# Patient Record
Sex: Female | Born: 1935 | Race: White | Hispanic: No | Smoking: Never smoker
Health system: Southern US, Community
[De-identification: ages and names within clinical notes are randomized; demographics above are authoritative.]

## PROBLEM LIST (undated history)

## (undated) DIAGNOSIS — M1990 Unspecified osteoarthritis, unspecified site: Secondary | ICD-10-CM

## (undated) DIAGNOSIS — Z947 Corneal transplant status: Secondary | ICD-10-CM

## (undated) DIAGNOSIS — E785 Hyperlipidemia, unspecified: Secondary | ICD-10-CM

## (undated) DIAGNOSIS — I10 Essential (primary) hypertension: Secondary | ICD-10-CM

## (undated) DIAGNOSIS — C569 Malignant neoplasm of unspecified ovary: Secondary | ICD-10-CM

## (undated) DIAGNOSIS — F32A Depression, unspecified: Secondary | ICD-10-CM

## (undated) DIAGNOSIS — Z8619 Personal history of other infectious and parasitic diseases: Secondary | ICD-10-CM

## (undated) DIAGNOSIS — F419 Anxiety disorder, unspecified: Secondary | ICD-10-CM

## (undated) DIAGNOSIS — K5790 Diverticulosis of intestine, part unspecified, without perforation or abscess without bleeding: Secondary | ICD-10-CM

## (undated) DIAGNOSIS — T7840XA Allergy, unspecified, initial encounter: Secondary | ICD-10-CM

## (undated) DIAGNOSIS — I639 Cerebral infarction, unspecified: Secondary | ICD-10-CM

## (undated) DIAGNOSIS — H269 Unspecified cataract: Secondary | ICD-10-CM

## (undated) DIAGNOSIS — H409 Unspecified glaucoma: Secondary | ICD-10-CM

## (undated) DIAGNOSIS — Z5189 Encounter for other specified aftercare: Secondary | ICD-10-CM

## (undated) DIAGNOSIS — F329 Major depressive disorder, single episode, unspecified: Secondary | ICD-10-CM

## (undated) HISTORY — DX: Hyperlipidemia, unspecified: E78.5

## (undated) HISTORY — PX: CORNEAL TRANSPLANT: SHX108

## (undated) HISTORY — PX: OOPHORECTOMY: SHX86

## (undated) HISTORY — DX: Corneal transplant status: Z94.7

## (undated) HISTORY — DX: Unspecified cataract: H26.9

## (undated) HISTORY — DX: Essential (primary) hypertension: I10

## (undated) HISTORY — DX: Anxiety disorder, unspecified: F41.9

## (undated) HISTORY — DX: Personal history of other infectious and parasitic diseases: Z86.19

## (undated) HISTORY — PX: SPINE SURGERY: SHX786

## (undated) HISTORY — DX: Unspecified osteoarthritis, unspecified site: M19.90

## (undated) HISTORY — PX: COLON SURGERY: SHX602

## (undated) HISTORY — PX: EYE SURGERY: SHX253

## (undated) HISTORY — PX: ABDOMINAL HYSTERECTOMY: SHX81

## (undated) HISTORY — DX: Encounter for other specified aftercare: Z51.89

## (undated) HISTORY — DX: Unspecified glaucoma: H40.9

## (undated) HISTORY — DX: Diverticulosis of intestine, part unspecified, without perforation or abscess without bleeding: K57.90

## (undated) HISTORY — DX: Major depressive disorder, single episode, unspecified: F32.9

## (undated) HISTORY — DX: Cerebral infarction, unspecified: I63.9

## (undated) HISTORY — DX: Allergy, unspecified, initial encounter: T78.40XA

## (undated) HISTORY — DX: Depression, unspecified: F32.A

---

## 1984-12-05 DIAGNOSIS — Z8543 Personal history of malignant neoplasm of ovary: Secondary | ICD-10-CM | POA: Insufficient documentation

## 1984-12-05 HISTORY — PX: APPENDECTOMY: SHX54

## 1984-12-05 HISTORY — PX: BILATERAL SALPINGOOPHORECTOMY: SHX1223

## 1993-12-05 DIAGNOSIS — C569 Malignant neoplasm of unspecified ovary: Secondary | ICD-10-CM

## 1993-12-05 HISTORY — DX: Malignant neoplasm of unspecified ovary: C56.9

## 1993-12-05 HISTORY — PX: COLON RESECTION: SHX5231

## 1996-12-05 HISTORY — PX: CHOLECYSTECTOMY: SHX55

## 1998-12-05 DIAGNOSIS — Z8673 Personal history of transient ischemic attack (TIA), and cerebral infarction without residual deficits: Secondary | ICD-10-CM | POA: Insufficient documentation

## 1998-12-05 DIAGNOSIS — F32A Depression, unspecified: Secondary | ICD-10-CM | POA: Insufficient documentation

## 2005-01-07 ENCOUNTER — Ambulatory Visit: Payer: Self-pay | Admitting: General Surgery

## 2005-01-28 ENCOUNTER — Ambulatory Visit: Payer: Self-pay | Admitting: General Surgery

## 2008-12-05 HISTORY — PX: CATARACT EXTRACTION: SUR2

## 2009-01-07 ENCOUNTER — Ambulatory Visit: Payer: Self-pay | Admitting: Family Medicine

## 2009-03-17 DIAGNOSIS — E782 Mixed hyperlipidemia: Secondary | ICD-10-CM | POA: Insufficient documentation

## 2009-03-29 DIAGNOSIS — E559 Vitamin D deficiency, unspecified: Secondary | ICD-10-CM | POA: Insufficient documentation

## 2009-05-30 IMAGING — CR DG HIP COMPLETE 2+V*L*
1 series · 2 of 2 positions shown · non-contrast
Comparison: none

REASON FOR EXAM: back pain, left hip pain
COMMENTS:

PROCEDURE:     KDR - KDXR HIP LEFT COMPLETE  - January 07, 2009  [DATE]
RESULT:     No fracture, dislocation or other acute bony abnormality is
identified. The hip joint space is well maintained. No lytic or blastic
lesions are seen.

[Series 2: view not recorded · 0.17mm/px · 2 of 2 slices shown]
[im 1/2]
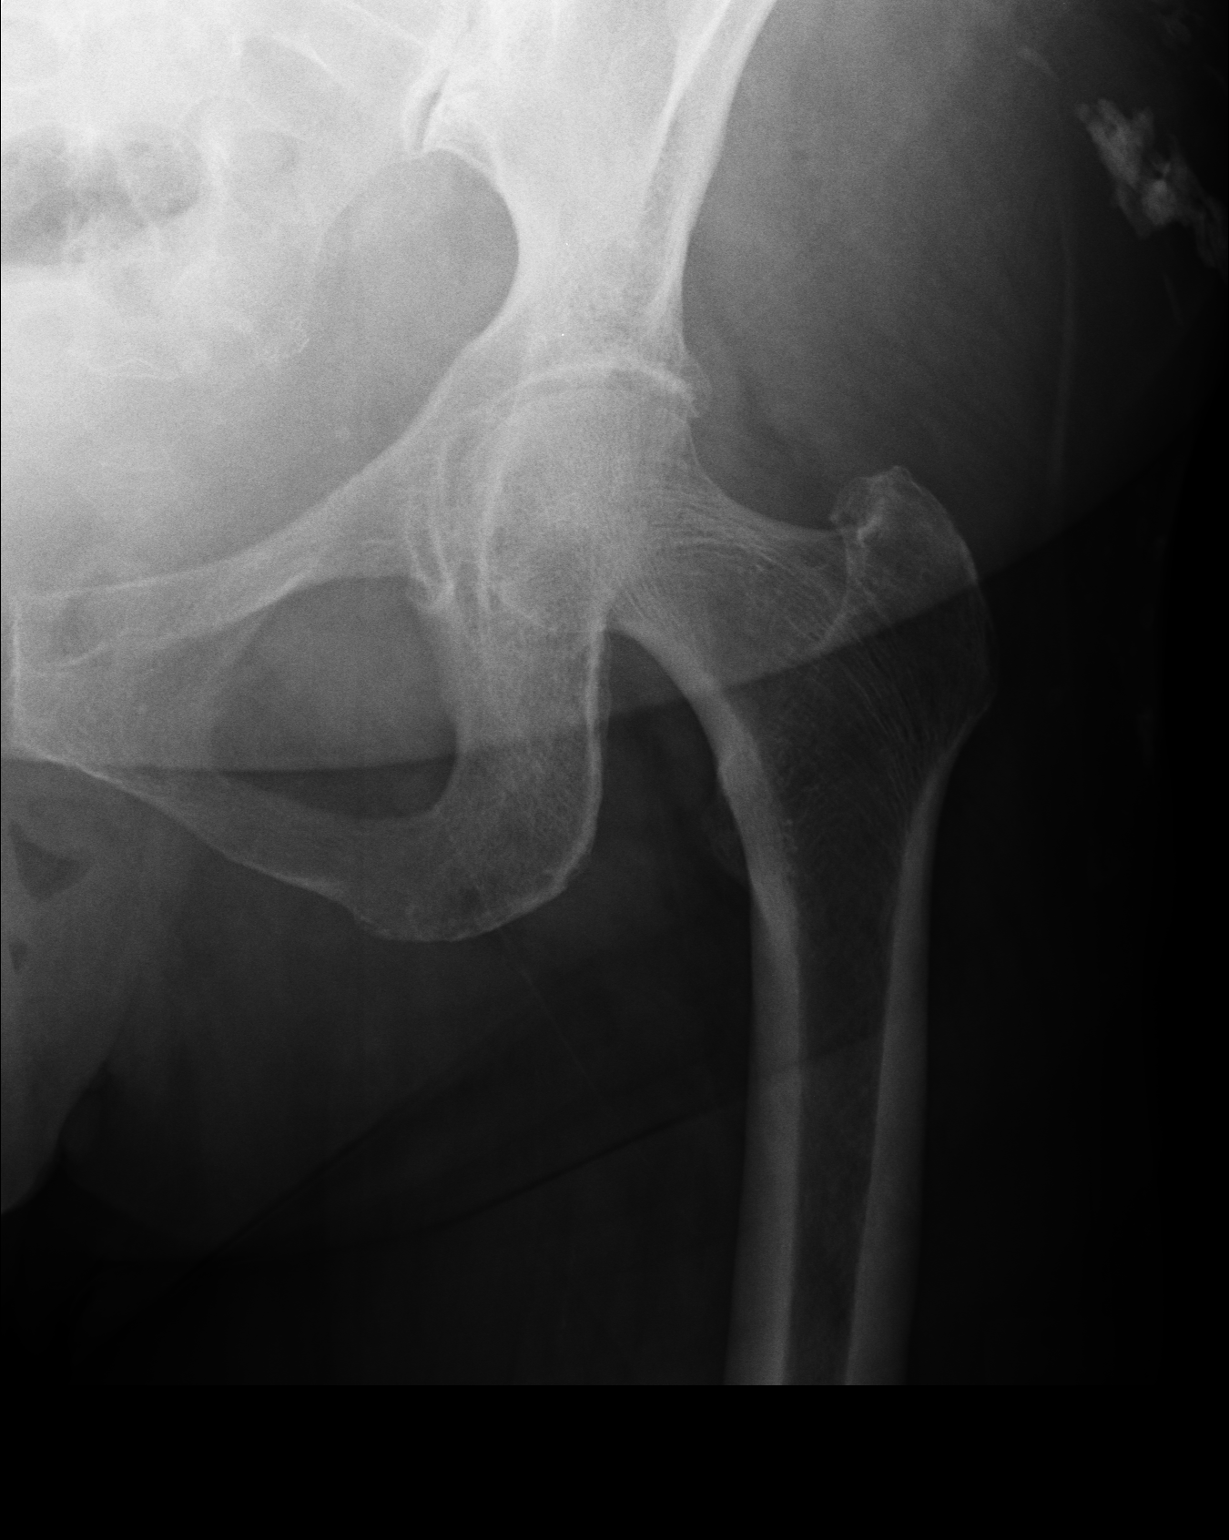
[im 2/2]
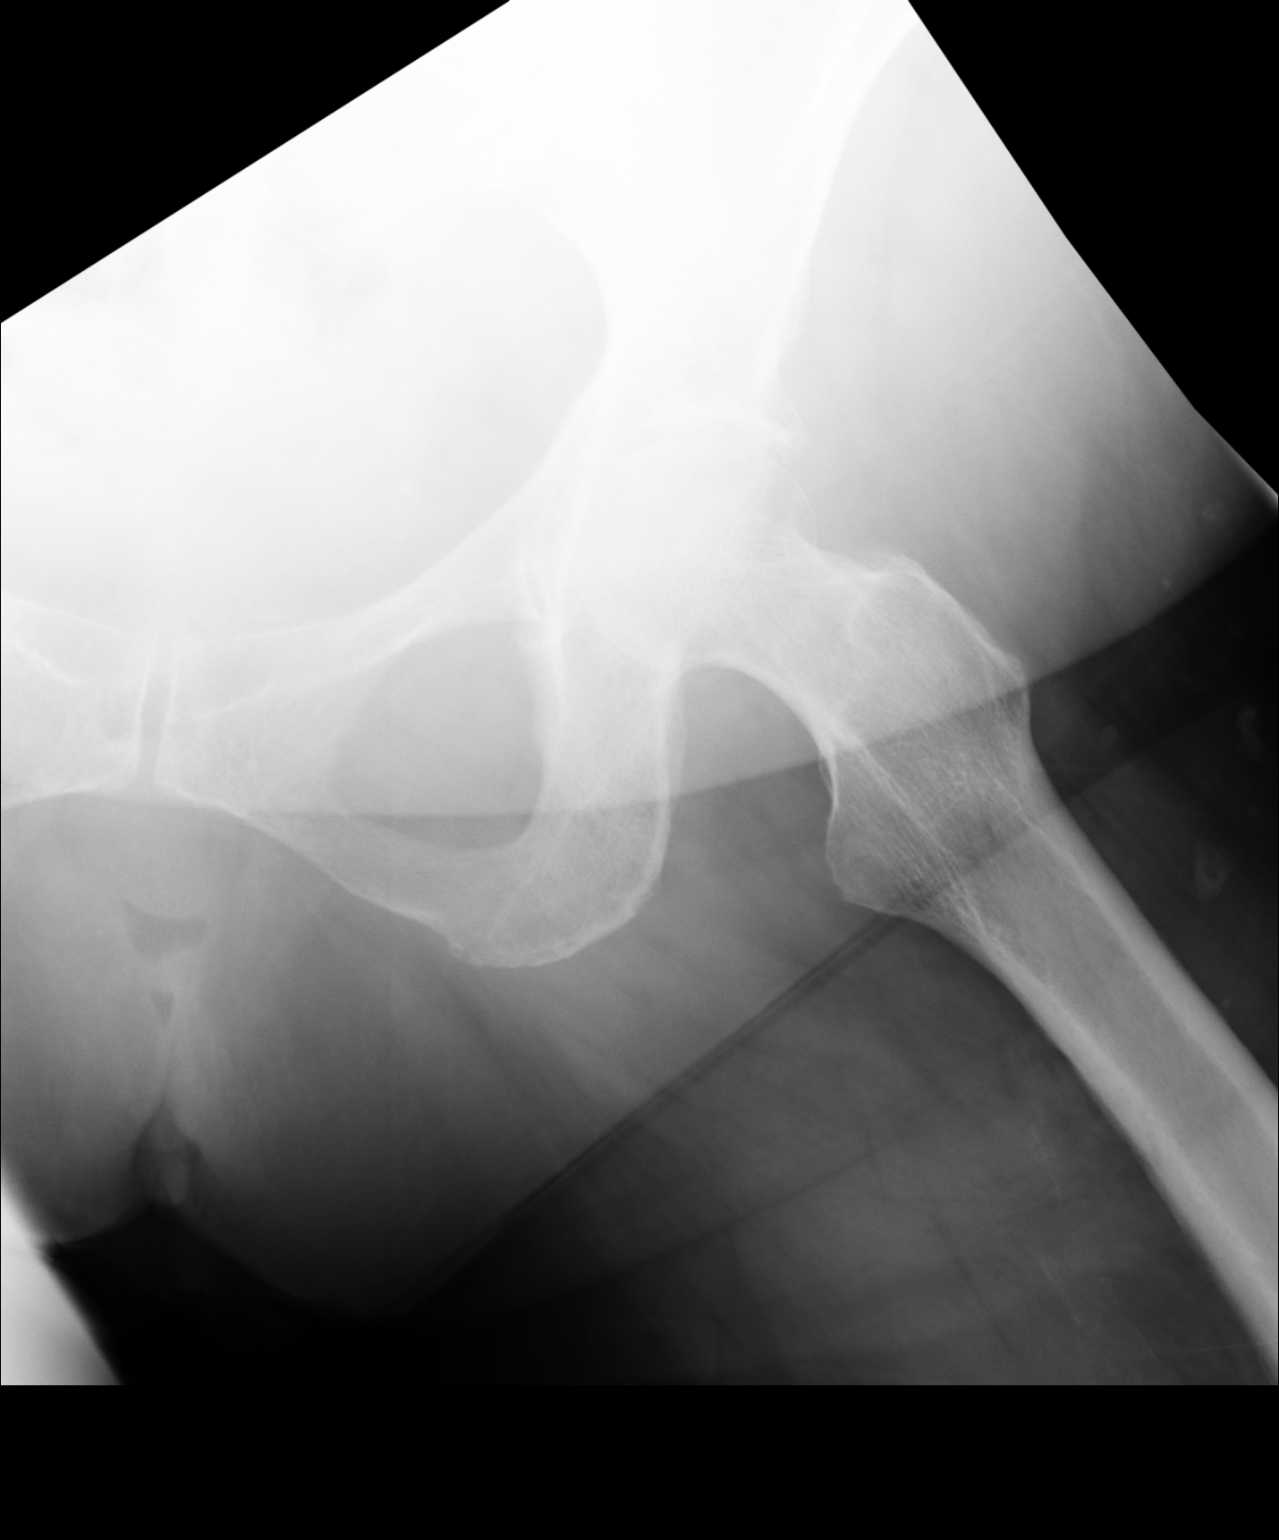

[2 of 2 positions shown; findings below may reference images not displayed]

IMPRESSION: No acute changes are identified.

## 2009-05-30 IMAGING — CR DG LUMBAR SPINE 2-3V
1 series · 3 of 3 positions shown · non-contrast
Comparison: none

REASON FOR EXAM: back pain, left hip pain
COMMENTS:

[Series 2: view not recorded · 0.17mm/px · 3 of 3 slices shown]
[im 1/3]
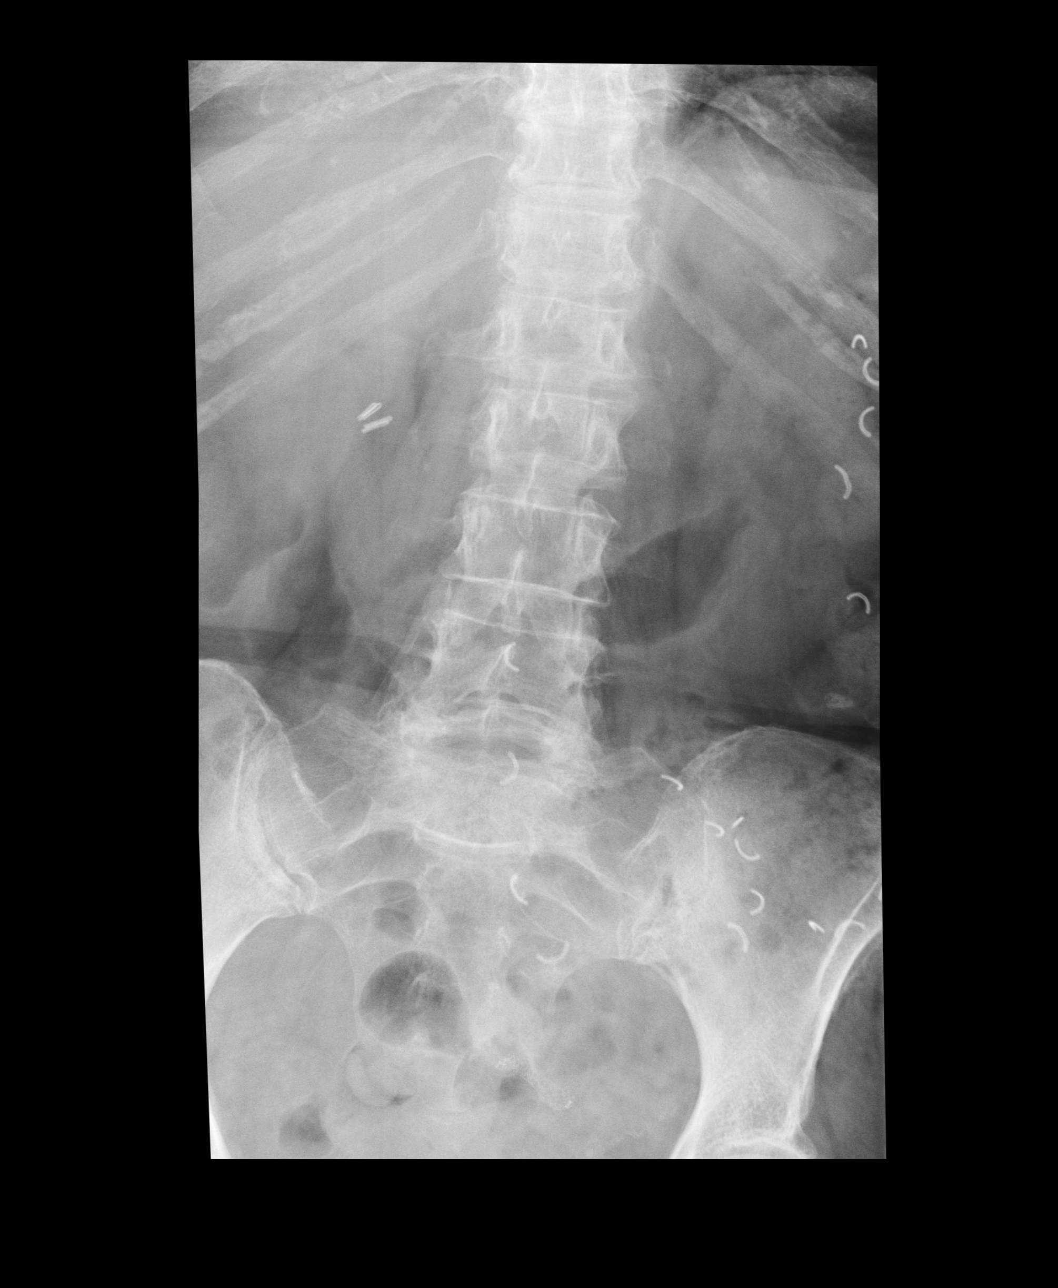
[im 2/3]
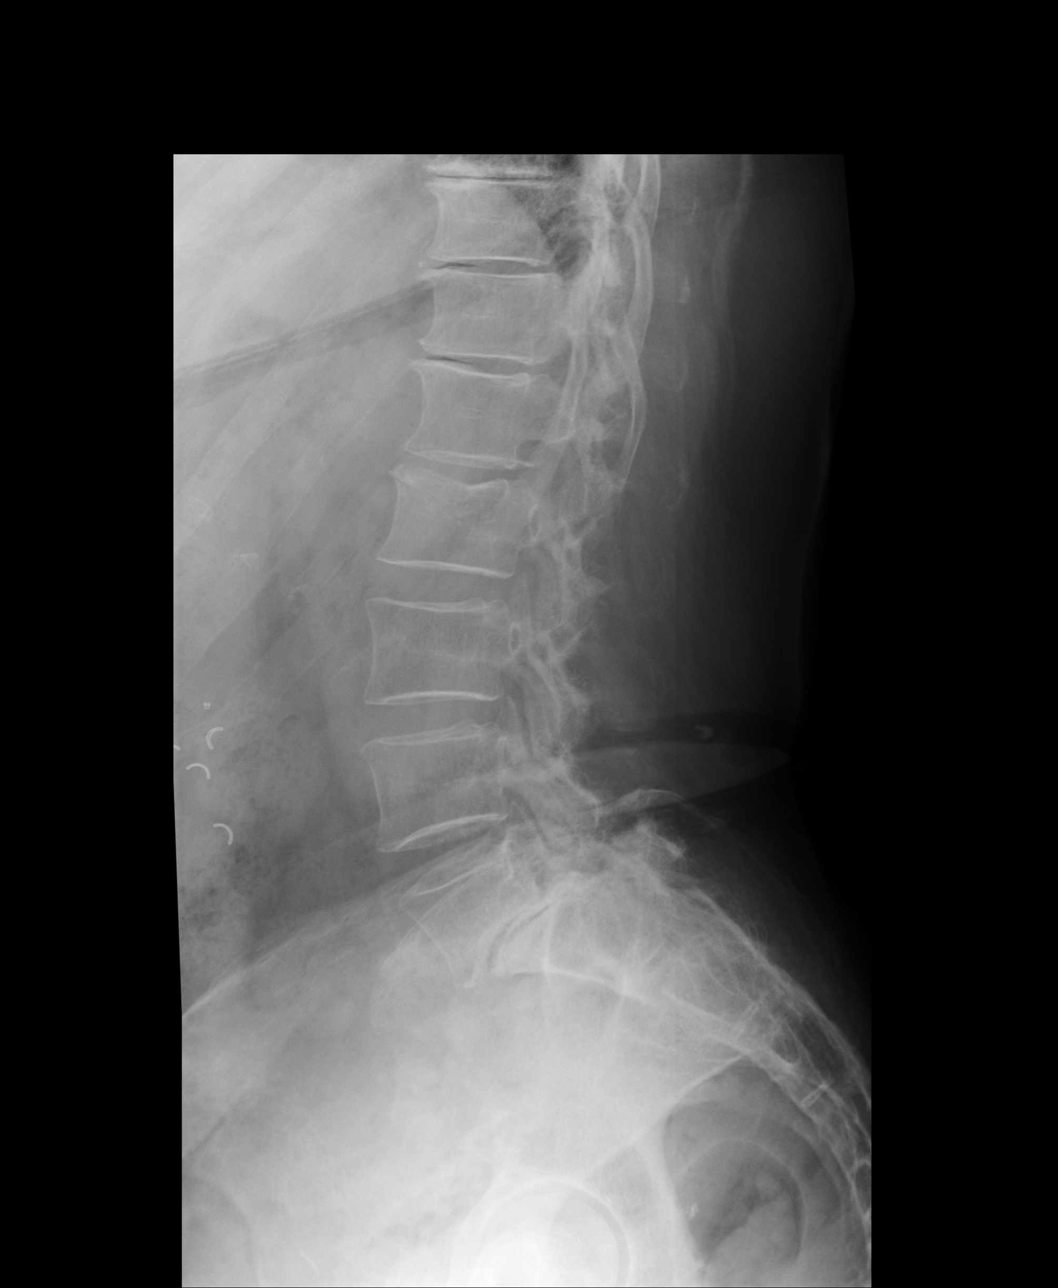
[im 3/3]
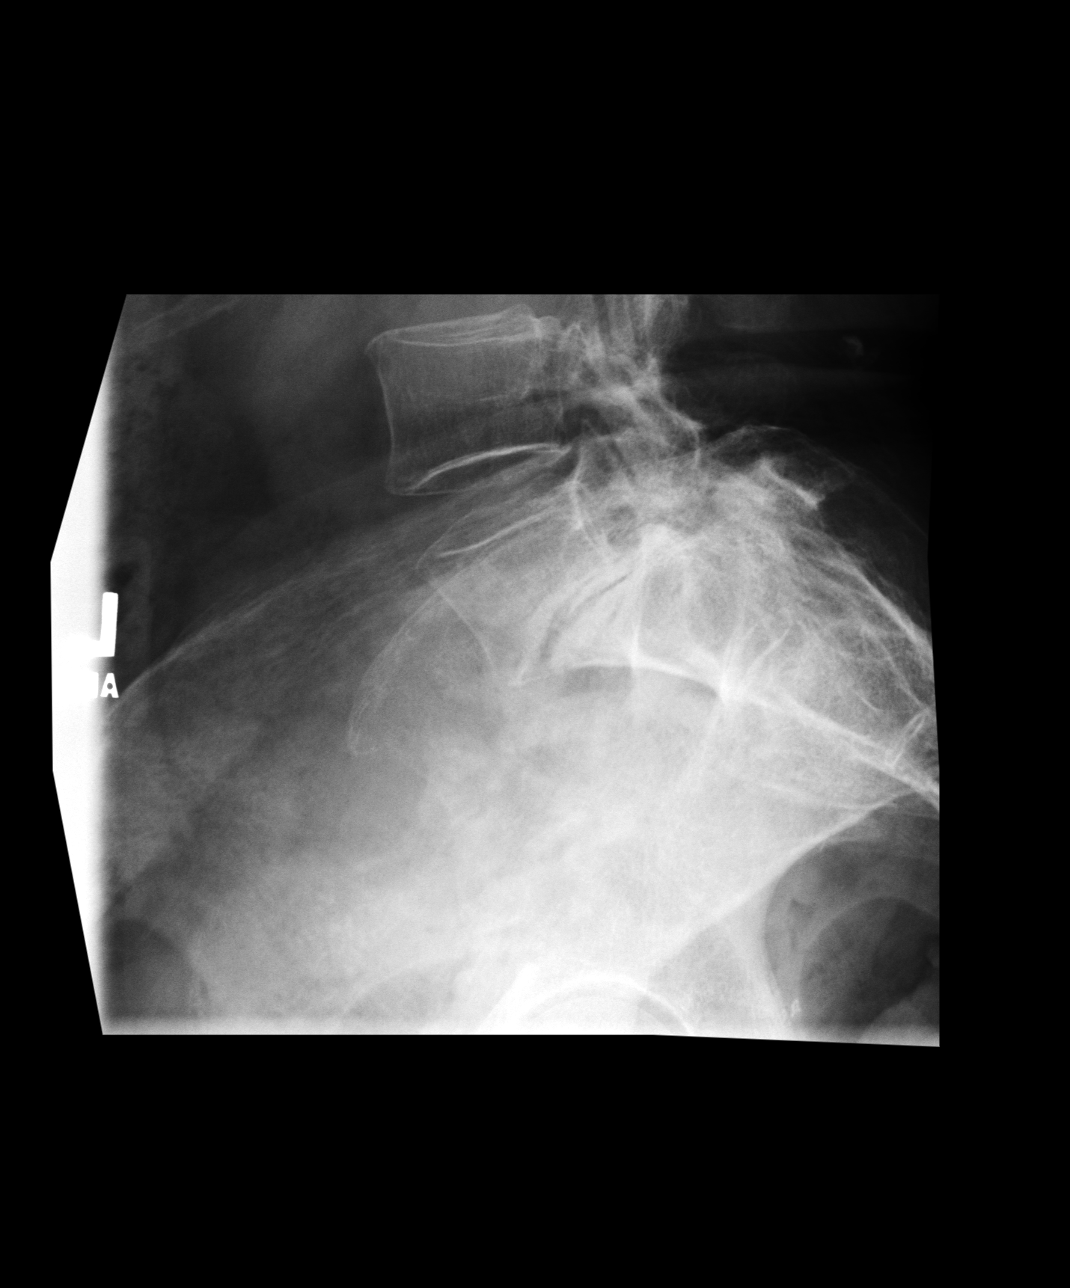

[3 of 3 positions shown; findings below may reference images not displayed]

PROCEDURE:     KDR - KDXR LUMBAR SPINE AP AND LATERAL  - January 07, 2009  [DATE]

RESULT:     No vertebral body fracture is seen. There is a 1.0 cm
anterolisthesis of L5 with respect to S1. There is narrowing of the L5-S1
disc space compatible with disc disease. The vertebral body alignment
otherwise is normal. There is slight narrowing of the L1-L2 intervertebral
disc space. The intervertebral disc spaces otherwise are well maintained.
The pedicles are bilaterally intact. No lytic or blastic lesions are seen.
IMPRESSION: 1. There is a 1.0 cm spondylolisthesis at L5-S1. This could be further
evaluated with CT if clinically indicated.
2. There is narrowing of L5-S1 disc space consistent with disc disease.
3. There is slight narrowing of the L1-L2 intervertebral disc space.
4. No lytic or blastic lesions are seen
5. No acute fracture is observed.

## 2009-08-03 ENCOUNTER — Inpatient Hospital Stay: Payer: Self-pay | Admitting: *Deleted

## 2009-08-13 DIAGNOSIS — G4700 Insomnia, unspecified: Secondary | ICD-10-CM | POA: Insufficient documentation

## 2009-09-03 DIAGNOSIS — R609 Edema, unspecified: Secondary | ICD-10-CM | POA: Insufficient documentation

## 2010-12-31 ENCOUNTER — Ambulatory Visit: Payer: Self-pay | Admitting: Family Medicine

## 2011-11-22 ENCOUNTER — Ambulatory Visit: Payer: Self-pay | Admitting: Family Medicine

## 2012-02-01 ENCOUNTER — Ambulatory Visit: Payer: Self-pay | Admitting: Family Medicine

## 2012-03-12 ENCOUNTER — Ambulatory Visit: Payer: Self-pay | Admitting: Physician Assistant

## 2012-05-05 HISTORY — PX: BACK SURGERY: SHX140

## 2012-10-24 LAB — HM DEXA SCAN: HM Dexa Scan: NORMAL

## 2013-02-15 ENCOUNTER — Ambulatory Visit: Payer: Self-pay | Admitting: Family Medicine

## 2013-08-07 ENCOUNTER — Ambulatory Visit: Payer: Self-pay | Admitting: Family Medicine

## 2013-09-18 DIAGNOSIS — H401190 Primary open-angle glaucoma, unspecified eye, stage unspecified: Secondary | ICD-10-CM | POA: Insufficient documentation

## 2014-03-21 ENCOUNTER — Ambulatory Visit: Payer: Self-pay | Admitting: Family Medicine

## 2014-03-21 LAB — HM MAMMOGRAPHY

## 2014-11-06 LAB — LIPID PANEL
Cholesterol: 163 mg/dL (ref 0–200)
HDL: 27 mg/dL — AB (ref 35–70)
LDL CALC: 93 mg/dL
Triglycerides: 216 mg/dL — AB (ref 40–160)

## 2014-12-31 LAB — CBC AND DIFFERENTIAL
HCT: 36 % (ref 36–46)
HEMOGLOBIN: 12.1 g/dL (ref 12.0–16.0)
Platelets: 262 10*3/uL (ref 150–399)
WBC: 6.8 10^3/mL

## 2014-12-31 LAB — BASIC METABOLIC PANEL
BUN: 24 mg/dL — AB (ref 4–21)
Creatinine: 1.1 mg/dL (ref 0.5–1.1)
GLUCOSE: 122 mg/dL
Potassium: 4.5 mmol/L (ref 3.4–5.3)
Sodium: 142 mmol/L (ref 137–147)

## 2014-12-31 LAB — HEPATIC FUNCTION PANEL
ALT: 16 U/L (ref 7–35)
AST: 37 U/L — AB (ref 13–35)

## 2014-12-31 LAB — TSH: TSH: 3.43 u[IU]/mL (ref 0.41–5.90)

## 2015-01-20 ENCOUNTER — Ambulatory Visit: Payer: Self-pay | Admitting: Family Medicine

## 2015-03-23 ENCOUNTER — Ambulatory Visit
Admit: 2015-03-23 | Disposition: A | Discharge status: Home / Self Care | Payer: Self-pay | Attending: Family Medicine | Admitting: Family Medicine

## 2015-05-18 ENCOUNTER — Other Ambulatory Visit: Payer: Self-pay | Admitting: Family Medicine

## 2015-05-18 MED ORDER — HYDROCODONE-ACETAMINOPHEN 7.5-325 MG PO TABS: 1.0000 | ORAL_TABLET | Freq: Four times a day (QID) | ORAL | Status: DC | PRN

## 2015-06-02 ENCOUNTER — Other Ambulatory Visit: Payer: Self-pay | Admitting: Family Medicine

## 2015-06-21 ENCOUNTER — Other Ambulatory Visit: Payer: Self-pay | Admitting: Family Medicine

## 2015-07-07 ENCOUNTER — Other Ambulatory Visit: Payer: Self-pay | Admitting: Family Medicine

## 2015-07-07 MED ORDER — CARISOPRODOL 350 MG PO TABS: 350.0000 mg | ORAL_TABLET | Freq: Four times a day (QID) | ORAL | Status: DC

## 2015-07-07 NOTE — Addendum Note (Signed)
Addended by: Julieta Bellini on: 07/07/2015 05:12 PM   Modules accepted: Orders

## 2015-07-07 NOTE — Telephone Encounter (Signed)
Refill request for carisoprodol 350 mg 1 every 6 hrs Last filled by MD on- 03/04/2015 #180 x0 Last Appt: 01/20/2015 Next Appt: none Please advise refill?

## 2015-07-07 NOTE — Telephone Encounter (Signed)
Needs refill for her generic soma.  [facility].  Thanks Con Memos

## 2015-07-16 ENCOUNTER — Other Ambulatory Visit: Payer: Self-pay | Admitting: Family Medicine

## 2015-08-05 ENCOUNTER — Other Ambulatory Visit: Payer: Self-pay | Admitting: Family Medicine

## 2015-08-05 MED ORDER — FLUOXETINE HCL 10 MG PO CAPS: 10.0000 mg | ORAL_CAPSULE | Freq: Two times a day (BID) | ORAL | Status: DC

## 2015-08-05 NOTE — Telephone Encounter (Signed)
Pt said she needs flouxetine 20mg  sent to [facility].  She is completely out.  [facility] ask her to call for a local refill  Her call back is  [phone].  Thanks Con Memos

## 2015-08-05 NOTE — Telephone Encounter (Signed)
Patient is requesting refill for Fluoxetine 10 mg x2 qd be sent to [facility]. Patient is completely out of medication due to [facility], who have not sent pt a rx since Feb. 2016. Patient was last seen 01/2015.

## 2015-08-06 ENCOUNTER — Other Ambulatory Visit: Payer: Self-pay | Admitting: *Deleted

## 2015-08-06 NOTE — Telephone Encounter (Signed)
Dr. Venia Minks sent 30 supply of fluoxetine 10 mg qd, to local pharmacy 08/05/2015. The patient was completely out of medication.

## 2015-08-06 NOTE — Telephone Encounter (Addendum)
[facility] sent rx request for Fluoxetine 20 mg qd. Patient is currently taking 10 mg x2 qd. [facility] 08/05/2015 concerning pt not receiving medication from mail order since feb 2016. [facility] stated that they have only been sending a 45 day quainity to the patient, due to her insurance. [facility] suggested that the patient's rx be changed to 20 mg qd. Patient is agreeable to changing dose. Patient stated that she takes both of the 10 mg fluoxetine at the same time. Please advise dose change?

## 2015-08-07 MED ORDER — FLUOXETINE HCL 20 MG PO CAPS: 20.0000 mg | ORAL_CAPSULE | Freq: Every day | ORAL | Status: DC

## 2015-08-07 NOTE — Telephone Encounter (Signed)
c 

## 2015-08-13 ENCOUNTER — Other Ambulatory Visit: Payer: Self-pay | Admitting: Family Medicine

## 2015-08-13 MED ORDER — HYDROCODONE-ACETAMINOPHEN 7.5-325 MG PO TABS: 1.0000 | ORAL_TABLET | Freq: Four times a day (QID) | ORAL | Status: DC | PRN

## 2015-08-13 NOTE — Telephone Encounter (Signed)
Pt is requesting a refill on her Hydrocodone 5-325 mg. Thanks TNP

## 2015-08-24 ENCOUNTER — Other Ambulatory Visit: Payer: Self-pay | Admitting: Family Medicine

## 2015-08-24 ENCOUNTER — Ambulatory Visit (INDEPENDENT_AMBULATORY_CARE_PROVIDER_SITE_OTHER): Payer: Medicare Other | Admitting: Family Medicine

## 2015-08-24 DIAGNOSIS — Z23 Encounter for immunization: Secondary | ICD-10-CM | POA: Diagnosis not present

## 2015-08-24 MED ORDER — HYDROCODONE-ACETAMINOPHEN 5-325 MG PO TABS: 1.0000 | ORAL_TABLET | Freq: Four times a day (QID) | ORAL | Status: DC | PRN

## 2015-08-29 ENCOUNTER — Other Ambulatory Visit: Payer: Self-pay | Admitting: Family Medicine

## 2015-09-30 ENCOUNTER — Other Ambulatory Visit: Payer: Self-pay | Admitting: Family Medicine

## 2015-10-19 ENCOUNTER — Other Ambulatory Visit: Payer: Self-pay

## 2015-10-19 MED ORDER — CARISOPRODOL 350 MG PO TABS: 350.0000 mg | ORAL_TABLET | Freq: Four times a day (QID) | ORAL | Status: DC

## 2015-10-19 NOTE — Telephone Encounter (Signed)
Pharmacy request refill. 

## 2015-10-23 NOTE — Telephone Encounter (Signed)
Dr. Caryn Section, I see that this medication has been approved but Im not sure what needs to be done with this message. It says "No Print". Was this prescription printed or am I to call this in? Thanks

## 2015-11-12 ENCOUNTER — Telehealth: Payer: Self-pay | Admitting: Family Medicine

## 2015-11-12 DIAGNOSIS — R197 Diarrhea, unspecified: Secondary | ICD-10-CM

## 2015-11-12 MED ORDER — DIPHENOXYLATE-ATROPINE 2.5-0.025 MG PO TABS: 1.0000 | ORAL_TABLET | Freq: Four times a day (QID) | ORAL | Status: DC | PRN

## 2015-11-12 MED ORDER — HYDROCODONE-ACETAMINOPHEN 7.5-325 MG PO TABS: 1.0000 | ORAL_TABLET | Freq: Four times a day (QID) | ORAL | Status: DC | PRN

## 2015-11-12 NOTE — Telephone Encounter (Signed)
Pt needs refill on her medication for diarrhea.  (diphenoxyla-atropine) Also she needs refill HYDROcodone-acetaminophen (NORCO) 7.5-325 MG per tablet   Her call back is [phone]  Thanks Con Memos

## 2015-11-24 ENCOUNTER — Ambulatory Visit (INDEPENDENT_AMBULATORY_CARE_PROVIDER_SITE_OTHER): Payer: Medicare Other | Admitting: Family Medicine

## 2015-11-24 ENCOUNTER — Encounter: Payer: Self-pay | Admitting: Family Medicine

## 2015-11-24 VITALS — BP 146/60 | HR 97 | Temp 97.6°F | Resp 18 | Ht 59.0 in | Wt 150.0 lb

## 2015-11-24 DIAGNOSIS — S93402A Sprain of unspecified ligament of left ankle, initial encounter: Secondary | ICD-10-CM | POA: Diagnosis not present

## 2015-11-24 DIAGNOSIS — J309 Allergic rhinitis, unspecified: Secondary | ICD-10-CM | POA: Insufficient documentation

## 2015-11-24 DIAGNOSIS — M545 Low back pain, unspecified: Secondary | ICD-10-CM | POA: Insufficient documentation

## 2015-11-24 DIAGNOSIS — M179 Osteoarthritis of knee, unspecified: Secondary | ICD-10-CM | POA: Insufficient documentation

## 2015-11-24 DIAGNOSIS — R5382 Chronic fatigue, unspecified: Secondary | ICD-10-CM | POA: Diagnosis not present

## 2015-11-24 DIAGNOSIS — R5383 Other fatigue: Secondary | ICD-10-CM | POA: Insufficient documentation

## 2015-11-24 DIAGNOSIS — M1990 Unspecified osteoarthritis, unspecified site: Secondary | ICD-10-CM | POA: Insufficient documentation

## 2015-11-24 DIAGNOSIS — Z803 Family history of malignant neoplasm of breast: Secondary | ICD-10-CM | POA: Insufficient documentation

## 2015-11-24 DIAGNOSIS — M1710 Unilateral primary osteoarthritis, unspecified knee: Secondary | ICD-10-CM | POA: Insufficient documentation

## 2015-11-24 DIAGNOSIS — K5730 Diverticulosis of large intestine without perforation or abscess without bleeding: Secondary | ICD-10-CM | POA: Insufficient documentation

## 2015-11-24 MED ORDER — CYANOCOBALAMIN 1000 MCG/ML IJ SOLN
1000.0000 ug | Freq: Once | INTRAMUSCULAR | Status: AC
  Administered 2015-11-24: 1000 ug via INTRAMUSCULAR

## 2015-11-24 NOTE — Patient Instructions (Addendum)
   Wear elastic ankle brace whenever you are walking or on your feet                                                                                    Apply ice to swollen ankle for 8-10 minutes at least three times a day until swelling is gone.    Keep ankle elevated whenever you are not walking

## 2015-11-24 NOTE — Progress Notes (Signed)
Patient: Julia Salazar Female    DOB: Oct 29, 1929   81 y.o.   MRN: [id number] Visit Date: 11/24/2015  Today's Provider: Lelon Huh, MD   Chief Complaint  Patient presents with  . Ankle Pain    x 1 month   Subjective:    Ankle Pain  Incident onset: 1 month ago. Injury mechanism: loss of balance. The pain is present in the left knee. Quality: sharp. The pain has been worsening since onset. Associated symptoms include an inability to bear weight. Pertinent negatives include no loss of motion, loss of sensation, muscle weakness, numbness or tingling. The symptoms are aggravated by weight bearing (and walking). She has tried heat (Vicodin) for the symptoms. The treatment provided mild relief.  Patient states about 1 month ago she lost her balance in the bathroom and fell up against the sliding doors. He started having pain in her left ankle shortly after that. Four days ago patient states she lost er balance again and tried to catch her self causing her left ankle to bend. Since this last event patient has had swelling of her left ankle and pain that radiates into her left calf muscle. She states she has been applying heat and wearing OTC ankle brace intermittently   Fatigue She states she has been more fatigued the last few months.Has been very busy caring for family members. States that she had B12 injection in the past which really helped a lot.   Lab Results  Component Value Date   WBC 6.8 12/31/2014   HGB 12.1 12/31/2014   HCT 36 12/31/2014   PLT 262 12/31/2014   CMP Latest Ref Rng 12/31/2014  BUN 4 - 21 mg/dL 24(A)  Creatinine 0.5 - 1.1 mg/dL 1.1  Sodium 137 - 147 mmol/L 142  Potassium 3.4 - 5.3 mmol/L 4.5  AST 13 - 35 U/L 37(A)  ALT 7 - 35 U/L 16    Lab Results  Component Value Date   TSH 3.43 12/31/2014       Allergies  Allergen Reactions  . Lovastatin   . Sertraline Hcl   . Etodolac Rash   Previous Medications   AMLODIPINE (NORVASC) 10 MG TABLET    TAKE  1 TABLET DAILY   ASPIRIN 81 MG TABLET    Take 1 tablet by mouth daily.   BIMATOPROST (LUMIGAN) 0.03 % OPHTHALMIC SOLUTION    daily.   BRIMONIDINE (ALPHAGAN P) 0.1 % SOLN    Apply to eye daily.   CALCIUM CARB-CHOLECALCIFEROL (CALCIUM 600 + D) 600-200 MG-UNIT TABS    Take 1 tablet by mouth daily.   CARISOPRODOL (SOMA) 350 MG TABLET    Take 1 tablet (350 mg total) by mouth every 6 (six) hours.   DIPHENOXYLATE-ATROPINE (LOMOTIL) 2.5-0.025 MG TABLET    Take 1 tablet by mouth 4 (four) times daily as needed.   DORZOLAMIDE-TIMOLOL (COSOPT) 22.3-6.8 MG/ML OPHTHALMIC SOLUTION    Place into both eyes 2 (two) times daily.   FENOFIBRATE 160 MG TABLET    Take 1 tablet by mouth daily.   FLUOXETINE (PROZAC) 20 MG CAPSULE    Take 1 capsule (20 mg total) by mouth daily.   FUROSEMIDE (LASIX) 40 MG TABLET    Take 1 tablet by mouth daily.   HYDROCHLOROTHIAZIDE (HYDRODIURIL) 12.5 MG TABLET    TAKE 1 TABLET DAILY   HYDROCODONE-ACETAMINOPHEN (NORCO) 7.5-325 MG TABLET    Take 1 tablet by mouth 4 (four) times daily as needed for moderate pain.  HYDROCODONE-ACETAMINOPHEN (NORCO/VICODIN) 5-325 MG PER TABLET    Take 1 tablet by mouth every 6 (six) hours as needed for moderate pain.   LOSARTAN (COZAAR) 100 MG TABLET    Take 1 tablet by mouth daily.   MELOXICAM (MOBIC) 15 MG TABLET    TAKE 1 TABLET DAILY   MULTIPLE VITAMINS-MINERALS (MULTIVITAMIN ADULT PO)    Take 1 tablet by mouth daily.   NIACIN CR 1000 MG TBCR    Take 1 tablet by mouth 2 (two) times daily.   OMEGA-3 ACID ETHYL ESTERS (LOVAZA) 1 G CAPSULE    Take 4 capsules by mouth daily.   POTASSIUM CHLORIDE SA (K-DUR,KLOR-CON) 20 MEQ TABLET    TAKE 1 TABLET DAILY   PRAVASTATIN (PRAVACHOL) 40 MG TABLET    TAKE 1 TABLET DAILY    Review of Systems  Constitutional: Negative for fever, chills, appetite change and fatigue.  Respiratory: Negative for chest tightness and shortness of breath.   Cardiovascular: Positive for leg swelling. Negative for chest pain and  palpitations.  Gastrointestinal: Negative for nausea, vomiting and abdominal pain.  Musculoskeletal: Positive for myalgias (left calf), arthralgias (left ankle) and gait problem.  Neurological: Negative for dizziness, tingling, weakness, light-headedness and numbness.    Social History  Substance Use Topics  . Smoking status: Never Smoker   . Smokeless tobacco: Not on file  . Alcohol Use: No   Objective:   BP 146/60 mmHg  Pulse 97  Temp(Src) 97.6 F (36.4 C) (Oral)  Resp 18  Ht 4\' 11"  (1.499 m)  Wt 150 lb (68.04 kg)  BMI 30.28 kg/m2  SpO2 96%   Cognitive Testing - 6-CIT  Correct? Score   What year is it? yes 0 0 or 4  What month is it? yes 0 0 or 3  Memorize:    Pia Mau,  44,  [place],      What time is it? (within 1 hour) yes 0 0 or 3  Count backwards from 20 yes 0 0, 2, or 4  Name the months of the year yes 0 0, 2, or 4  Repeat name & address above no 3 0, 2, 4, 6, 8, or 10       TOTAL SCORE  3/28   Interpretation:  Normal  Normal (0-7) Abnormal (8-28)    Fall Risk  11/24/2015  Falls in the past year? Yes  Number falls in past yr: 2 or more  Injury with Fall? Yes   Depression screen [facility] 2/9 11/24/2015  Decreased Interest 0  Down, Depressed, Hopeless 0  PHQ - 2 Score 0  Altered sleeping 2  Tired, decreased energy 2  Change in appetite 0  Feeling bad or failure about yourself  0  Trouble concentrating 0  Moving slowly or fidgety/restless 0  Suicidal thoughts 0  PHQ-9 Score 4  Difficult doing work/chores Somewhat difficult     Physical Exam  General appearance: alert, well developed, well nourished, cooperative and in no distress Head: Normocephalic, without obvious abnormality, atraumatic MS: Moderately swollen left lateral ankle with tenderness of talo-fibular joint. No erythema. Not warm to touch.      Assessment & Plan:     1. Left ankle sprain, initial encounter Patient Instructions   Wear elastic ankle brace whenever you are  walking or on your feet  Apply ice to swollen ankle for 8-10 minutes at least three times a day until swelling is gone.    Keep ankle elevated whenever you are not walking     2. Chronic fatigue Has had negative work up. Has felt improved with B12 injection in the past, which she requests today.        Lelon Huh, MD  [facility]

## 2015-11-24 NOTE — Addendum Note (Signed)
Addended by: Randal Buba on: 11/24/2015 04:32 PM   Modules accepted: Orders

## 2015-11-26 ENCOUNTER — Encounter: Payer: Self-pay | Admitting: Family Medicine

## 2015-11-27 ENCOUNTER — Other Ambulatory Visit: Payer: Self-pay | Admitting: Family Medicine

## 2015-12-21 ENCOUNTER — Other Ambulatory Visit: Payer: Self-pay | Admitting: Family Medicine

## 2015-12-21 MED ORDER — HYDROCODONE-ACETAMINOPHEN 5-325 MG PO TABS: 1.0000 | ORAL_TABLET | Freq: Four times a day (QID) | ORAL | Status: DC | PRN

## 2015-12-21 NOTE — Telephone Encounter (Signed)
Pt contacted office for refill request on the following medications:  HYDROcodone-acetaminophen (NORCO/VICODIN) 5-325 MG per tablet.  CB#[phone]/MW

## 2016-01-12 ENCOUNTER — Other Ambulatory Visit: Payer: Self-pay | Admitting: Family Medicine

## 2016-02-24 ENCOUNTER — Other Ambulatory Visit: Payer: Self-pay | Admitting: Family Medicine

## 2016-02-24 MED ORDER — HYDROCODONE-ACETAMINOPHEN 7.5-325 MG PO TABS: 1.0000 | ORAL_TABLET | Freq: Four times a day (QID) | ORAL | Status: DC | PRN

## 2016-02-24 NOTE — Telephone Encounter (Signed)
Pt contacted office for refill request on the following medications:  HYDROcodone-acetaminophen (NORCO) 7.5-325 MG tablet.  CB#[phone]/MW

## 2016-03-03 ENCOUNTER — Other Ambulatory Visit: Payer: Self-pay | Admitting: Family Medicine

## 2016-03-07 ENCOUNTER — Other Ambulatory Visit: Payer: Self-pay | Admitting: Family Medicine

## 2016-03-07 DIAGNOSIS — Z1231 Encounter for screening mammogram for malignant neoplasm of breast: Secondary | ICD-10-CM

## 2016-03-18 ENCOUNTER — Other Ambulatory Visit: Payer: Self-pay | Admitting: Family Medicine

## 2016-03-22 ENCOUNTER — Other Ambulatory Visit: Payer: Self-pay | Admitting: Family Medicine

## 2016-03-22 MED ORDER — HYDROCODONE-ACETAMINOPHEN 7.5-325 MG PO TABS: 1.0000 | ORAL_TABLET | Freq: Four times a day (QID) | ORAL | Status: DC | PRN

## 2016-03-22 NOTE — Telephone Encounter (Signed)
Pt needs refill HYDROcodone-acetaminophen (NORCO) 7.5-325 MG tablet  Please call when ready.   Thanks Con Memos

## 2016-03-23 ENCOUNTER — Encounter: Payer: Self-pay | Admitting: Family Medicine

## 2016-03-23 ENCOUNTER — Ambulatory Visit
Admission: RE | Admit: 2016-03-23 | Discharge: 2016-03-23 | Disposition: A | Discharge status: Home / Self Care | Payer: Medicare Other | Source: Ambulatory Visit | Attending: Family Medicine | Admitting: Family Medicine

## 2016-03-23 ENCOUNTER — Other Ambulatory Visit: Payer: Self-pay | Admitting: Family Medicine

## 2016-03-23 DIAGNOSIS — Z1231 Encounter for screening mammogram for malignant neoplasm of breast: Secondary | ICD-10-CM

## 2016-03-23 HISTORY — DX: Malignant neoplasm of unspecified ovary: C56.9

## 2016-03-24 ENCOUNTER — Other Ambulatory Visit: Payer: Self-pay | Admitting: Family Medicine

## 2016-03-24 MED ORDER — HYDROCODONE-ACETAMINOPHEN 7.5-325 MG PO TABS: 1.0000 | ORAL_TABLET | Freq: Four times a day (QID) | ORAL | Status: DC | PRN

## 2016-03-25 ENCOUNTER — Other Ambulatory Visit: Payer: Self-pay | Admitting: Family Medicine

## 2016-03-25 MED ORDER — HYDROCODONE-ACETAMINOPHEN 5-325 MG PO TABS: 1.0000 | ORAL_TABLET | Freq: Four times a day (QID) | ORAL | Status: DC | PRN

## 2016-04-12 ENCOUNTER — Other Ambulatory Visit: Payer: Self-pay | Admitting: *Deleted

## 2016-04-12 MED ORDER — CARISOPRODOL 350 MG PO TABS: 350.0000 mg | ORAL_TABLET | Freq: Four times a day (QID) | ORAL | Status: DC

## 2016-04-12 NOTE — Telephone Encounter (Signed)
[facility]ing [facility]

## 2016-05-05 ENCOUNTER — Other Ambulatory Visit: Payer: Self-pay | Admitting: Family Medicine

## 2016-05-05 NOTE — Telephone Encounter (Signed)
Patient was notified of results. Patient expressed understanding. 

## 2016-05-05 NOTE — Telephone Encounter (Signed)
Please advise patient that we received refill request for fenofibrate, but I would like to have her stop this medication and then see how her cholesterol does.  She is due for follow up visit, so have her stop fenofibrate, continue all all other medications and schedule follow up in a month to check labs.

## 2016-05-31 ENCOUNTER — Other Ambulatory Visit: Payer: Self-pay | Admitting: Family Medicine

## 2016-06-01 NOTE — Telephone Encounter (Signed)
Prescription phoned into pharmacy.

## 2016-06-01 NOTE — Telephone Encounter (Signed)
Refill request for DIPHENOXYLA/ATROPINE 2.5-0.025MG  TA Last filled by MD on- 11/12/2015 #30 x3 refills Last Appt: 11/24/2015 Next Appt: 06/08/2016 Please advise refill?

## 2016-06-08 ENCOUNTER — Ambulatory Visit (INDEPENDENT_AMBULATORY_CARE_PROVIDER_SITE_OTHER): Payer: Medicare Other | Admitting: Family Medicine

## 2016-06-08 ENCOUNTER — Ambulatory Visit: Payer: Medicare Other | Admitting: Physician Assistant

## 2016-06-08 ENCOUNTER — Encounter: Payer: Self-pay | Admitting: Family Medicine

## 2016-06-08 VITALS — BP 128/68 | HR 74 | Temp 98.2°F | Resp 16 | Ht 59.0 in | Wt 150.0 lb

## 2016-06-08 DIAGNOSIS — I10 Essential (primary) hypertension: Secondary | ICD-10-CM

## 2016-06-08 DIAGNOSIS — R600 Localized edema: Secondary | ICD-10-CM | POA: Diagnosis not present

## 2016-06-08 DIAGNOSIS — R5382 Chronic fatigue, unspecified: Secondary | ICD-10-CM | POA: Diagnosis not present

## 2016-06-08 DIAGNOSIS — E559 Vitamin D deficiency, unspecified: Secondary | ICD-10-CM | POA: Diagnosis not present

## 2016-06-08 DIAGNOSIS — E782 Mixed hyperlipidemia: Secondary | ICD-10-CM

## 2016-06-08 DIAGNOSIS — F329 Major depressive disorder, single episode, unspecified: Secondary | ICD-10-CM

## 2016-06-08 DIAGNOSIS — F32A Depression, unspecified: Secondary | ICD-10-CM

## 2016-06-08 DIAGNOSIS — M179 Osteoarthritis of knee, unspecified: Secondary | ICD-10-CM

## 2016-06-08 DIAGNOSIS — M1712 Unilateral primary osteoarthritis, left knee: Secondary | ICD-10-CM

## 2016-06-08 MED ORDER — DIPHENOXYLATE-ATROPINE 2.5-0.025 MG PO TABS: 1.0000 | ORAL_TABLET | Freq: Four times a day (QID) | ORAL | Status: DC | PRN

## 2016-06-08 MED ORDER — HYDROCODONE-ACETAMINOPHEN 7.5-325 MG PO TABS: 1.0000 | ORAL_TABLET | Freq: Four times a day (QID) | ORAL | Status: DC | PRN

## 2016-06-08 NOTE — Progress Notes (Signed)
Patient: Julia Salazar Female    DOB: 10-06-1929   82 y.o.   MRN: [id number] Visit Date: 06/08/2016  Today's Provider: Lelon Huh, MD   Chief Complaint  Patient presents with  . Follow-up  . Hypertension  . Hyperlipidemia   Subjective:    HPI    Hypertension, follow-up:  BP Readings from Last 3 Encounters:  06/08/16 116/58  11/24/15 146/60    She was last seen for hypertension 11/03/2014.  BP at that visit was 146/80. Management since that visit includes; no changes.She reports fair compliance with treatment. She is not having side effects. none  She is not exercising. She is adherent to low salt diet.   Outside blood pressures are 120/60. She is experiencing none.  Patient denies none.   Cardiovascular risk factors include none.  Use of agents associated with hypertension: none.   ----------------------------------------------------------------------    Lipid/Cholesterol, Follow-up:   Last seen for this 11/03/2014  Management since that visit includes; no changes.  Stopped fenofibrate 05/05/2016 due to lack of cardiac benefit over statin alone.   Last Lipid Panel:    Component Value Date/Time   CHOL 163 11/06/2014   TRIG 216* 11/06/2014   HDL 27* 11/06/2014   LDLCALC 93 11/06/2014    She reports good compliance with treatment. She is not having side effects. none  Wt Readings from Last 3 Encounters:  06/08/16 150 lb (68.04 kg)  11/24/15 150 lb (68.04 kg)    ----------------------------------------------------------------------  Patient has had diarrhea for last 2 weeks. No other symptoms other then runny stool. Also patient has had more swelling in ankles then she usually does. Has history IBS. Taking lomotil once or twice a day which helps. States symptoms are similar to previous bouts of IBS.   Complains of left knee pain which has flared up recently. Vicodin helps.   Allergies  Allergen Reactions  . Lovastatin   . Sertraline Hcl   .  Etodolac Rash   Current Meds  Medication Sig  . amLODipine (NORVASC) 10 MG tablet TAKE 1 TABLET DAILY  . aspirin 81 MG tablet Take 1 tablet by mouth daily.  . bimatoprost (LUMIGAN) 0.03 % ophthalmic solution daily.  . brimonidine (ALPHAGAN P) 0.1 % SOLN Apply to eye daily.  . Calcium Carb-Cholecalciferol (CALCIUM 600 + D) 600-200 MG-UNIT TABS Take 1 tablet by mouth daily.  . carisoprodol (SOMA) 350 MG tablet Take 1 tablet (350 mg total) by mouth every 6 (six) hours.  . diphenoxylate-atropine (LOMOTIL) 2.5-0.025 MG tablet TAKE ONE TABLET BY MOUTH FOUR TIMES A DAY AS NEEDED  . dorzolamide-timolol (COSOPT) 22.3-6.8 MG/ML ophthalmic solution Place into both eyes 2 (two) times daily.  Marland Kitchen FLUoxetine (PROZAC) 10 MG capsule TAKE 1 CAPSULE TWICE A DAY  . furosemide (LASIX) 40 MG tablet TAKE 1 TABLET DAILY  . hydrochlorothiazide (HYDRODIURIL) 12.5 MG tablet TAKE 1 TABLET DAILY  . HYDROcodone-acetaminophen (NORCO) 7.5-325 MG tablet Take 1 tablet by mouth 4 (four) times daily as needed for moderate pain.  Marland Kitchen HYDROcodone-acetaminophen (NORCO/VICODIN) 5-325 MG tablet Take 1 tablet by mouth every 6 (six) hours as needed for moderate pain.  Marland Kitchen losartan (COZAAR) 100 MG tablet TAKE 1 TABLET DAILY  . meloxicam (MOBIC) 15 MG tablet TAKE 1 TABLET DAILY  . Multiple Vitamins-Minerals (MULTIVITAMIN ADULT PO) Take 1 tablet by mouth daily.  . Niacin CR 1000 MG TBCR Take 1 tablet by mouth 2 (two) times daily.  Marland Kitchen omega-3 acid ethyl esters (LOVAZA) 1 G capsule  Take 4 capsules by mouth daily.  . potassium chloride SA (K-DUR,KLOR-CON) 20 MEQ tablet TAKE 1 TABLET DAILY  . pravastatin (PRAVACHOL) 40 MG tablet TAKE 1 TABLET DAILY  . [DISCONTINUED] FLUoxetine (PROZAC) 20 MG capsule Take 1 capsule (20 mg total) by mouth daily.    Review of Systems  Constitutional: Negative for fever, chills, appetite change and fatigue.  Respiratory: Negative for chest tightness and shortness of breath.   Cardiovascular: Positive for leg  swelling. Negative for chest pain and palpitations.  Gastrointestinal: Positive for diarrhea. Negative for nausea, vomiting and abdominal pain.  Neurological: Negative for dizziness and weakness.    Social History  Substance Use Topics  . Smoking status: Never Smoker   . Smokeless tobacco: Not on file  . Alcohol Use: No   Objective:   BP 116/58 mmHg  Pulse 74  Temp(Src) 98.2 F (36.8 C) (Oral)  Resp 16  Ht 4\' 11"  (1.499 m)  Wt 150 lb (68.04 kg)  BMI 30.28 kg/m2  SpO2 97%  Physical Exam   General Appearance:    Alert, cooperative, no distress  Eyes:    PERRL, conjunctiva/corneas clear, EOM's intact       Lungs:     Clear to auscultation bilaterally, respirations unlabored  Heart:    Regular rate and rhythm. 2+ bilateral ankle edema.   Neurologic:   Awake, alert, oriented x 3. No apparent focal neurological           defect.           Assessment & Plan:     1. Vitamin D deficiency Due to check lebels.  - VITAMIN D 25 Hydroxy (Vit-D Deficiency, Fractures)  2. Hyperlipidemia, mixed She is tolerating pravastatin well with no adverse effects.  Off fenofibrate for last month.  - Lipid panel  3. Chronic fatigue  - CBC - T4 AND TSH  4. Clinical depression Fairly well controlled on current dose of fluoxetine.   5. Essential (primary) hypertension Well controlled.  Continue current medications.   - EKG 12-Lead - Comprehensive metabolic panel  6. Localized edema  - Comprehensive metabolic panel  7. Osteoarthritis of left knee, unspecified osteoarthritis type Fairly well controlled on current dose of pain medications. Has done well with steroid injections in the past and will consider returning for another injection.  - HYDROcodone-acetaminophen (NORCO) 7.5-325 MG tablet; Take 1 tablet by mouth 4 (four) times daily as needed for moderate pain.  Dispense: 360 tablet; Refill: 0     The entirety of the information documented in the History of Present Illness, Review  of Systems and Physical Exam were personally obtained by me. Portions of this information were initially documented by April M. Sabra Heck, CMA and reviewed by me for thoroughness and accuracy.    Lelon Huh, MD  [facility]

## 2016-06-10 ENCOUNTER — Telehealth: Payer: Self-pay

## 2016-06-10 LAB — COMPREHENSIVE METABOLIC PANEL WITH GFR
ALT: 20 [IU]/L (ref 0–32)
AST: 34 [IU]/L (ref 0–40)
Albumin/Globulin Ratio: 1.4 (ref 1.2–2.2)
Albumin: 4.2 g/dL (ref 3.5–4.7)
Alkaline Phosphatase: 52 [IU]/L (ref 39–117)
BUN/Creatinine Ratio: 19 (ref 12–28)
BUN: 19 mg/dL (ref 8–27)
Bilirubin Total: 0.3 mg/dL (ref 0.0–1.2)
CO2: 19 mmol/L (ref 18–29)
Calcium: 9.8 mg/dL (ref 8.7–10.3)
Chloride: 105 mmol/L (ref 96–106)
Creatinine, Ser: 1.02 mg/dL — ABNORMAL HIGH (ref 0.57–1.00)
GFR calc Af Amer: 58 mL/min/{1.73_m2} — ABNORMAL LOW
GFR calc non Af Amer: 50 mL/min/{1.73_m2} — ABNORMAL LOW
Globulin, Total: 2.9 g/dL (ref 1.5–4.5)
Glucose: 96 mg/dL (ref 65–99)
Potassium: 4.6 mmol/L (ref 3.5–5.2)
Sodium: 145 mmol/L — ABNORMAL HIGH (ref 134–144)
Total Protein: 7.1 g/dL (ref 6.0–8.5)

## 2016-06-10 LAB — LIPID PANEL
CHOLESTEROL TOTAL: 163 mg/dL (ref 100–199)
Chol/HDL Ratio: 2.9 ratio units (ref 0.0–4.4)
HDL: 56 mg/dL (ref >39)
LDL Calculated: 81 mg/dL (ref 0–99)
TRIGLYCERIDES: 129 mg/dL (ref 0–149)
VLDL Cholesterol Cal: 26 mg/dL (ref 5–40)

## 2016-06-10 LAB — T4 AND TSH
T4, Total: 7 ug/dL (ref 4.5–12.0)
TSH: 4.34 u[IU]/mL (ref 0.450–4.500)

## 2016-06-10 LAB — CBC
HEMATOCRIT: 33.9 % — AB (ref 34.0–46.6)
Hemoglobin: 11.5 g/dL (ref 11.1–15.9)
MCH: 31.4 pg (ref 26.6–33.0)
MCHC: 33.9 g/dL (ref 31.5–35.7)
MCV: 93 fL (ref 79–97)
PLATELETS: 255 10*3/uL (ref 150–379)
RBC: 3.66 x10E6/uL — ABNORMAL LOW (ref 3.77–5.28)
RDW: 12.8 % (ref 12.3–15.4)
WBC: 5.3 10*3/uL (ref 3.4–10.8)

## 2016-06-10 LAB — VITAMIN D 25 HYDROXY (VIT D DEFICIENCY, FRACTURES): VIT D 25 HYDROXY: 45 ng/mL (ref 30.0–100.0)

## 2016-06-10 NOTE — Telephone Encounter (Signed)
-----   Message from Birdie Sons, MD sent at 06/10/2016  8:05 AM EDT ----- Labs are all very good. Cholesterol and triglycerides are very well controlled. Normal thyroid and blood cell count. Continue pravastatin. Recommend she stop Niacin, continue Lovaza for now. Follow up 4-5 months.

## 2016-06-10 NOTE — Telephone Encounter (Signed)
Patient advised as below.  

## 2016-07-04 ENCOUNTER — Other Ambulatory Visit: Payer: Self-pay

## 2016-07-04 MED ORDER — OMEGA-3-ACID ETHYL ESTERS 1 G PO CAPS: 4.0000 | ORAL_CAPSULE | Freq: Every day | ORAL | 4 refills | Status: DC

## 2016-07-04 NOTE — Telephone Encounter (Signed)
Pharmacy faxed over a refill request.

## 2016-07-25 ENCOUNTER — Other Ambulatory Visit: Payer: Self-pay | Admitting: Family Medicine

## 2016-07-25 DIAGNOSIS — M1712 Unilateral primary osteoarthritis, left knee: Secondary | ICD-10-CM

## 2016-07-25 NOTE — Telephone Encounter (Signed)
Pt says she needs the 5/325mg . (I fixed the order.)  Thanks,   -Mickel Baas

## 2016-07-25 NOTE — Telephone Encounter (Signed)
Pt contacted office for refill request on the following medications:  HYDROcodone-acetaminophen (NORCO/VICODIN) 5-325 MG tablet.  CB#[phone]/MW

## 2016-07-25 NOTE — Telephone Encounter (Signed)
Please clarify if she needs the 5/325, or the 7.5/325

## 2016-07-25 NOTE — Telephone Encounter (Signed)
LMTCB 07/25/2016  Thanks,   -Mickel Baas

## 2016-07-26 MED ORDER — HYDROCODONE-ACETAMINOPHEN 5-325 MG PO TABS: 1.0000 | ORAL_TABLET | Freq: Four times a day (QID) | ORAL | 0 refills | Status: DC | PRN

## 2016-08-15 ENCOUNTER — Other Ambulatory Visit: Payer: Self-pay | Admitting: *Deleted

## 2016-08-15 DIAGNOSIS — G4700 Insomnia, unspecified: Secondary | ICD-10-CM

## 2016-08-15 NOTE — Telephone Encounter (Signed)
Please call in Soma 

## 2016-08-16 MED ORDER — CARISOPRODOL 350 MG PO TABS: 350.0000 mg | ORAL_TABLET | Freq: Four times a day (QID) | ORAL | 1 refills | Status: DC

## 2016-08-16 NOTE — Telephone Encounter (Signed)
Rx faxed to pharmacy  

## 2016-08-25 ENCOUNTER — Other Ambulatory Visit: Payer: Self-pay | Admitting: Family Medicine

## 2016-09-07 ENCOUNTER — Other Ambulatory Visit: Payer: Self-pay | Admitting: Family Medicine

## 2016-09-07 DIAGNOSIS — M1712 Unilateral primary osteoarthritis, left knee: Secondary | ICD-10-CM

## 2016-09-07 MED ORDER — HYDROCODONE-ACETAMINOPHEN 7.5-325 MG PO TABS: 1.0000 | ORAL_TABLET | Freq: Four times a day (QID) | ORAL | 0 refills | Status: DC | PRN

## 2016-09-07 NOTE — Telephone Encounter (Signed)
Pt needs refill on her hydrocodone 7.25  Please call pt when ready for pick up  Thanks Julia Salazar

## 2016-09-09 ENCOUNTER — Telehealth: Payer: Self-pay | Admitting: Family Medicine

## 2016-09-09 NOTE — Telephone Encounter (Signed)
lvm with pt to schedule awv and cpe - knb

## 2016-09-09 NOTE — Telephone Encounter (Signed)
Pt returned call and schedule AWE & CPE for 10/05/16 at 1 pm with nurse and 2 pm with Dr. Caryn Section. Thanks TNP

## 2016-09-16 ENCOUNTER — Other Ambulatory Visit: Payer: Self-pay | Admitting: Family Medicine

## 2016-10-05 ENCOUNTER — Ambulatory Visit: Payer: Medicare Other | Admitting: Family Medicine

## 2016-10-05 ENCOUNTER — Ambulatory Visit (INDEPENDENT_AMBULATORY_CARE_PROVIDER_SITE_OTHER): Payer: Medicare Other | Admitting: Family Medicine

## 2016-10-05 VITALS — BP 139/74 | HR 80 | Temp 98.6°F | Ht 58.75 in | Wt 157.0 lb

## 2016-10-05 DIAGNOSIS — Z0000 Encounter for general adult medical examination without abnormal findings: Secondary | ICD-10-CM

## 2016-10-05 DIAGNOSIS — E782 Mixed hyperlipidemia: Secondary | ICD-10-CM

## 2016-10-05 DIAGNOSIS — I10 Essential (primary) hypertension: Secondary | ICD-10-CM

## 2016-10-05 DIAGNOSIS — R197 Diarrhea, unspecified: Secondary | ICD-10-CM | POA: Diagnosis not present

## 2016-10-05 DIAGNOSIS — K529 Noninfective gastroenteritis and colitis, unspecified: Secondary | ICD-10-CM | POA: Insufficient documentation

## 2016-10-05 DIAGNOSIS — M1712 Unilateral primary osteoarthritis, left knee: Secondary | ICD-10-CM | POA: Diagnosis not present

## 2016-10-05 MED ORDER — DIPHENOXYLATE-ATROPINE 2.5-0.025 MG PO TABS: 1.0000 | ORAL_TABLET | Freq: Four times a day (QID) | ORAL | 3 refills | Status: DC | PRN

## 2016-10-05 MED ORDER — DICYCLOMINE HCL 10 MG PO CAPS: 10.0000 mg | ORAL_CAPSULE | Freq: Three times a day (TID) | ORAL | 1 refills | Status: DC

## 2016-10-05 NOTE — Patient Instructions (Signed)
Julia Salazar , Thank you for taking time to come for your Medicare Wellness Visit. I appreciate your ongoing commitment to your health goals. Please review the following plan we discussed and let me know if I can assist you in the future.   These are the goals we discussed: Goals    . Increase water intake          Starting 10/05/16, I will increase my water intake to 4 glasses a day.       This is a list of the screening recommended for you and due dates:  Health Maintenance  Topic Date Due  . Tetanus Vaccine  11/03/2024  . Flu Shot  Completed  . DEXA scan (bone density measurement)  Completed  . Shingles Vaccine  Completed  . Pneumonia vaccines  Completed   Preventive Care for Adults  A healthy lifestyle and preventive care can promote health and wellness. Preventive health guidelines for adults include the following key practices.  . A routine yearly physical is a good way to check with your health care provider about your health and preventive screening. It is a chance to share any concerns and updates on your health and to receive a thorough exam.  . Visit your dentist for a routine exam and preventive care every 6 months. Brush your teeth twice a day and floss once a day. Good oral hygiene prevents tooth decay and gum disease.  . The frequency of eye exams is based on your age, health, family medical history, use  of contact lenses, and other factors. Follow your health care provider's ecommendations for frequency of eye exams.  . Eat a healthy diet. Foods like vegetables, fruits, whole grains, low-fat dairy products, and lean protein foods contain the nutrients you need without too many calories. Decrease your intake of foods high in solid fats, added sugars, and salt. Eat the right amount of calories for you. Get information about a proper diet from your health care provider, if necessary.  . Regular physical exercise is one of the most important things you can do for your  health. Most adults should get at least 150 minutes of moderate-intensity exercise (any activity that increases your heart rate and causes you to sweat) each week. In addition, most adults need muscle-strengthening exercises on 2 or more days a week.  Silver Sneakers may be a benefit available to you. To determine eligibility, you may visit the website: www.silversneakers.com or contact program at [phone])626-3583 Mon-Fri between 8AM-8PM.   . Maintain a healthy weight. The body mass index (BMI) is a screening tool to identify possible weight problems. It provides an estimate of body fat based on height and weight. Your health care provider can find your BMI and can help you achieve or maintain a healthy weight.   For adults 20 years and older: ? A BMI below 18.5 is considered underweight. ? A BMI of 18.5 to 24.9 is normal. ? A BMI of 25 to 29.9 is considered overweight. ? A BMI of 30 and above is considered obese.   . Maintain normal blood lipids and cholesterol levels by exercising and minimizing your intake of saturated fat. Eat a balanced diet with plenty of fruit and vegetables. Blood tests for lipids and cholesterol should begin at age 18 and be repeated every 5 years. If your lipid or cholesterol levels are high, you are over 50, or you are at high risk for heart disease, you may need your cholesterol levels checked more frequently. Ongoing  high lipid and cholesterol levels should be treated with medicines if diet and exercise are not working.  . If you smoke, find out from your health care provider how to quit. If you do not use tobacco, please do not start.  . If you choose to drink alcohol, please do not consume more than 2 drinks per day. One drink is considered to be 12 ounces (355 mL) of beer, 5 ounces (148 mL) of wine, or 1.5 ounces (44 mL) of liquor.  . If you are 82-45 years old, ask your health care provider if you should take aspirin to prevent strokes.  . Use sunscreen. Apply  sunscreen liberally and repeatedly throughout the day. You should seek shade when your shadow is shorter than you. Protect yourself by wearing long sleeves, pants, a wide-brimmed hat, and sunglasses year round, whenever you are outdoors.  . Once a month, do a whole body skin exam, using a mirror to look at the skin on your back. Tell your health care provider of new moles, moles that have irregular borders, moles that are larger than a pencil eraser, or moles that have changed in shape or color.

## 2016-10-05 NOTE — Progress Notes (Signed)
Patient: Julia Salazar Female    DOB: 11-09-1929   82 y.o.   MRN: [id number] Visit Date: 10/05/2016  Today's Provider: Lelon Huh, MD   Chief Complaint  Patient presents with  . Hypertension    follow up  . Depression    follow up  . Hyperlipidemia    follow up   Subjective:    HPI  Hypertension, follow-up:  BP Readings from Last 3 Encounters:  10/05/16 139/74  06/08/16 128/68  11/24/15 (!) 146/60    She was last seen for hypertension 3 months ago.  BP at that visit was 116/ 58. Management since that visit includes no changes. She reports good compliance with treatment. She is not having side effects.  She is not exercising. She is adherent to low salt diet.   Outside blood pressures are not being checked. She is experiencing lower extremity edema.  Patient denies chest pain, chest pressure/discomfort, claudication, dyspnea, exertional chest pressure/discomfort, irregular heart beat, near-syncope, orthopnea, palpitations, paroxysmal nocturnal dyspnea, syncope and tachypnea.   Cardiovascular risk factors include advanced age (older than 28 for men, 81 for women) and hypertension.  Use of agents associated with hypertension: NSAIDS.     Weight trend: increasing steadily Wt Readings from Last 3 Encounters:  10/05/16 157 lb (71.2 kg)  06/08/16 150 lb (68 kg)  11/24/15 150 lb (68 kg)    Current diet: well balanced  ------------------------------------------------------------------------  Lipid/Cholesterol, Follow-up:   Last seen for this3 months ago.  Management changes since that visit include stopping Niacin and advising patient to continue Pravastatin and Lovaza.  . . Last Lipid Panel:    Component Value Date/Time   CHOL 163 06/09/2016 0818   TRIG 129 06/09/2016 0818   HDL 56 06/09/2016 0818   CHOLHDL 2.9 06/09/2016 0818   LDLCALC 81 06/09/2016 0818    Risk factors for vascular disease include hypercholesterolemia and hypertension  She  reports fair compliance with treatment. Patient is unsure if she is still taking Pravastatin She is not having side effects.  Current symptoms include none and have been stable. Weight trend: increasing steadily Prior visit with dietician: no Current diet: well balanced Current exercise: none  Wt Readings from Last 3 Encounters:  10/05/16 157 lb (71.2 kg)  06/08/16 150 lb (68 kg)  11/24/15 150 lb (68 kg)    ------------------------------------------------------------------- Follow up Depression:  Patient was last seen 3 months ago for this problem and no changes were made. Patient reports good compliance with treatment, good tolerance and fair symptom control.   Follow up Osteoarthritis of Left knee:  Patient was last seen 3 months ago and no changes were made. Patient reports good compliance with treatment, good tolerance and fair symptom control. Patient has been using a knee brace which has helped improve knee pain.   Follow up Vitamin D deficiency:  Patient was last seen 3 months ago for this problem and no changes. Patient reports good compliance with treatment and good tolerance.   Follow up  Continues to have frequent loose stools and is taking Lomotil and probiotics daily.     Allergies  Allergen Reactions  . Lovastatin   . Sertraline Hcl   . Etodolac Rash     Current Outpatient Prescriptions:  .  amLODipine (NORVASC) 10 MG tablet, TAKE 1 TABLET DAILY, Disp: 90 tablet, Rfl: 4 .  aspirin 81 MG tablet, Take 1 tablet by mouth daily., Disp: , Rfl:  .  bimatoprost (LUMIGAN) 0.03 % ophthalmic  solution, daily., Disp: , Rfl:  .  brimonidine (ALPHAGAN P) 0.1 % SOLN, Apply to eye daily., Disp: , Rfl:  .  Calcium Carb-Cholecalciferol (CALCIUM 600 + D) 600-200 MG-UNIT TABS, Take 1 tablet by mouth daily., Disp: , Rfl:  .  carisoprodol (SOMA) 350 MG tablet, Take 1 tablet (350 mg total) by mouth every 6 (six) hours., Disp: 180 tablet, Rfl: 1 .  diphenoxylate-atropine (LOMOTIL)  2.5-0.025 MG tablet, Take 1 tablet by mouth 4 (four) times daily as needed., Disp: 30 tablet, Rfl: 3 .  dorzolamide-timolol (COSOPT) 22.3-6.8 MG/ML ophthalmic solution, Place into both eyes 2 (two) times daily., Disp: , Rfl:  .  fenofibrate 160 MG tablet, Take 160 mg by mouth daily., Disp: , Rfl:  .  FLUoxetine (PROZAC) 10 MG capsule, TAKE 1 CAPSULE TWICE A DAY, Disp: 180 capsule, Rfl: 4 .  furosemide (LASIX) 40 MG tablet, TAKE 1 TABLET DAILY, Disp: 90 tablet, Rfl: 3 .  hydrochlorothiazide (HYDRODIURIL) 12.5 MG tablet, TAKE 1 TABLET DAILY, Disp: 90 tablet, Rfl: 4 .  HYDROcodone-acetaminophen (NORCO) 7.5-325 MG tablet, Take 1 tablet by mouth 4 (four) times daily as needed for moderate pain., Disp: 360 tablet, Rfl: 0 .  HYDROcodone-acetaminophen (NORCO/VICODIN) 5-325 MG tablet, Take 1 tablet by mouth every 6 (six) hours as needed for moderate pain., Disp: 360 tablet, Rfl: 0 .  losartan (COZAAR) 100 MG tablet, TAKE 1 TABLET DAILY, Disp: 90 tablet, Rfl: 3 .  meloxicam (MOBIC) 15 MG tablet, TAKE 1 TABLET DAILY, Disp: 90 tablet, Rfl: 1 .  Multiple Vitamins-Minerals (MULTIVITAMIN ADULT PO), Take 1 tablet by mouth daily., Disp: , Rfl:  .  Niacin CR 1000 MG TBCR, Take 1 tablet by mouth 2 (two) times daily., Disp: , Rfl:  .  omega-3 acid ethyl esters (LOVAZA) 1 g capsule, Take 4 capsules (4 g total) by mouth daily., Disp: 360 capsule, Rfl: 4 .  potassium chloride SA (K-DUR,KLOR-CON) 20 MEQ tablet, TAKE 1 TABLET DAILY, Disp: 90 tablet, Rfl: 3 .  pravastatin (PRAVACHOL) 40 MG tablet, TAKE 1 TABLET DAILY, Disp: 90 tablet, Rfl: 4 .  Probiotic Product (DIGESTIVE ADVANTAGE GUMMIES PO), Take by mouth daily., Disp: , Rfl:   Review of Systems  Constitutional: Negative for appetite change, chills, fatigue and fever.  Respiratory: Negative for chest tightness and shortness of breath.   Cardiovascular: Positive for leg swelling. Negative for chest pain and palpitations.  Gastrointestinal: Positive for constipation  and diarrhea. Negative for abdominal pain, nausea and vomiting.  Musculoskeletal: Positive for arthralgias.  Neurological: Negative for dizziness and weakness.    Social History  Substance Use Topics  . Smoking status: Never Smoker  . Smokeless tobacco: Never Used  . Alcohol use No   Objective:     BP  139/74 (BP Location: Left Arm)     Pulse  80     Temp  98.6 F (37 C) (Oral)     Ht  4' 10.75" (1.492 m)     Wt  157 lb (71.2 kg)      BMI  31.98 kg/m       Physical Exam   General Appearance:    Alert, cooperative, no distress  Eyes:    PERRL, conjunctiva/corneas clear, EOM's intact       Lungs:     Clear to auscultation bilaterally, respirations unlabored  Heart:    Regular rate and rhythm  Neurologic:   Awake, alert, oriented x 3. No apparent focal neurological  defect.          Assessment & Plan:     1. Hyperlipidemia, mixed She is tolerating pravastatin well with no adverse effects.   - Lipid panel  2. Essential (primary) hypertension Well controlled.  Continue current medications.    3. Osteoarthritis of left knee, unspecified osteoarthritis type Doing well with prn hydrocodone/apap, is alternating between 5 and 7.5mg  depending on severity of pain.   4. Diarrhea, unspecified type Is up to date on colonoscopies. Try adding dicyclomine - dicyclomine (BENTYL) 10 MG capsule; Take 1 capsule (10 mg total) by mouth 4 (four) times daily -  before meals and at bedtime. As needed for bowel movements  Dispense: 30 capsule; Refill: 1 - diphenoxylate-atropine (LOMOTIL) 2.5-0.025 MG tablet; Take 1 tablet by mouth 4 (four) times daily as needed.  Dispense: 30 tablet; Refill: 3     The entirety of the information documented in the History of Present Illness, Review of Systems and Physical Exam were personally obtained by me. Portions of this information were initially documented by Meyer Cory, CMA and reviewed by me for thoroughness and accuracy.      Lelon Huh, MD  [facility]

## 2016-10-05 NOTE — Progress Notes (Signed)
Subjective:   Julia Salazar is a 82 y.o. female who presents for Medicare Annual (Subsequent) preventive examination.  Review of Systems:  N/A Cardiac Risk Factors include: advanced age (>25men, >65 women);dyslipidemia;hypertension;obesity (BMI >30kg/m2)     Objective:     Vitals: BP 139/74 (BP Location: Left Arm)   Pulse 80   Temp 98.6 F (37 C) (Oral)   Ht 4' 10.75" (1.492 m)   Wt 157 lb (71.2 kg)   BMI 31.98 kg/m   Body mass index is 31.98 kg/m.   Tobacco History  Smoking Status  . Never Smoker  Smokeless Tobacco  . Never Used     Counseling given: Not Answered   Past Medical History:  Diagnosis Date  . Diverticulosis   . History of chicken pox   . History of measles   . History of mumps   . Ovarian cancer ([place]) 1995   Past Surgical History:  Procedure Laterality Date  . ABDOMINAL HYSTERECTOMY     1973  . APPENDECTOMY  1986  . BACK SURGERY  05/2012   done at [facility] in [place]  . BILATERAL SALPINGOOPHORECTOMY  1986  . CATARACT EXTRACTION  2010   right eye  . CHOLECYSTECTOMY  1998  . COLON RESECTION  1995   Family History  Problem Relation Age of Onset  . Colon cancer Mother   . Heart attack Father   . Breast cancer Sister 32  . Leukemia Sister    History  Sexual Activity  . Sexual activity: Not on file    Outpatient Encounter Prescriptions as of 10/05/2016  Medication Sig  . amLODipine (NORVASC) 10 MG tablet TAKE 1 TABLET DAILY  . aspirin 81 MG tablet Take 1 tablet by mouth daily.  . bimatoprost (LUMIGAN) 0.03 % ophthalmic solution daily.  . brimonidine (ALPHAGAN P) 0.1 % SOLN Apply to eye daily.  . Calcium Carb-Cholecalciferol (CALCIUM 600 + D) 600-200 MG-UNIT TABS Take 1 tablet by mouth daily.  . carisoprodol (SOMA) 350 MG tablet Take 1 tablet (350 mg total) by mouth every 6 (six) hours.  . diphenoxylate-atropine (LOMOTIL) 2.5-0.025 MG tablet Take 1 tablet by mouth 4 (four) times daily as needed.  . dorzolamide-timolol  (COSOPT) 22.3-6.8 MG/ML ophthalmic solution Place into both eyes 2 (two) times daily.  . fenofibrate 160 MG tablet Take 160 mg by mouth daily.  Marland Kitchen FLUoxetine (PROZAC) 10 MG capsule TAKE 1 CAPSULE TWICE A DAY  . furosemide (LASIX) 40 MG tablet TAKE 1 TABLET DAILY  . hydrochlorothiazide (HYDRODIURIL) 12.5 MG tablet TAKE 1 TABLET DAILY  . HYDROcodone-acetaminophen (NORCO) 7.5-325 MG tablet Take 1 tablet by mouth 4 (four) times daily as needed for moderate pain.  Marland Kitchen HYDROcodone-acetaminophen (NORCO/VICODIN) 5-325 MG tablet Take 1 tablet by mouth every 6 (six) hours as needed for moderate pain.  . meloxicam (MOBIC) 15 MG tablet TAKE 1 TABLET DAILY  . Multiple Vitamins-Minerals (MULTIVITAMIN ADULT PO) Take 1 tablet by mouth daily.  Marland Kitchen omega-3 acid ethyl esters (LOVAZA) 1 g capsule Take 4 capsules (4 g total) by mouth daily.  . potassium chloride SA (K-DUR,KLOR-CON) 20 MEQ tablet TAKE 1 TABLET DAILY  . Probiotic Product (DIGESTIVE ADVANTAGE GUMMIES PO) Take by mouth daily.  Marland Kitchen losartan (COZAAR) 100 MG tablet TAKE 1 TABLET DAILY  . Niacin CR 1000 MG TBCR Take 1 tablet by mouth 2 (two) times daily.  . pravastatin (PRAVACHOL) 40 MG tablet TAKE 1 TABLET DAILY (Patient not taking: Reported on 10/05/2016)   No facility-administered encounter medications  on file as of 10/05/2016.     Activities of Daily Living In your present state of health, do you have any difficulty performing the following activities: 10/05/2016  Hearing? N  Vision? N  Difficulty concentrating or making decisions? Y  Walking or climbing stairs? N  Dressing or bathing? N  Doing errands, shopping? N  Preparing Food and eating ? N  Using the Toilet? N  In the past six months, have you accidently leaked urine? Y  Do you have problems with loss of bowel control? Y  Managing your Medications? N  Managing your Finances? N  Housekeeping or managing your Housekeeping? Y  Some recent data might be hidden    Patient Care Team: Birdie Sons, MD as PCP - General (Family Medicine) Oneta Rack, MD (Dermatology) Provider Not In System (Orthopedic Surgery) Crista Curb. Gloriann Loan, MD as Consulting Physician (Ophthalmology) Conception Chancy, DDS as Referring Physician (Dentistry)    Assessment:     Exercise Activities and Dietary recommendations Current Exercise Habits: The patient does not participate in regular exercise at present, Exercise limited by: orthopedic condition(s)  Goals    . Increase water intake          Starting 10/05/16, I will increase my water intake to 4 glasses a day.      Fall Risk Fall Risk  10/05/2016 11/24/2015  Falls in the past year? No Yes  Number falls in past yr: - 2 or more  Injury with Fall? - Yes   Depression Screen PHQ 2/9 Scores 10/05/2016 11/24/2015  PHQ - 2 Score 1 0  PHQ- 9 Score - 4     Cognitive Function     6CIT Screen 10/05/2016  What Year? 0 points  What month? 0 points  What time? 0 points  Count back from 20 0 points  Months in reverse 0 points  Repeat phrase 2 points  Total Score 2    Immunization History  Administered Date(s) Administered  . Influenza, High Dose Seasonal PF 08/24/2015  . Pneumococcal Conjugate-13 11/03/2014  . Pneumococcal Polysaccharide-23 10/17/2012  . Td 11/03/2014  . Zoster 11/07/2013   Screening Tests Health Maintenance  Topic Date Due  . TETANUS/TDAP  11/03/2024  . INFLUENZA VACCINE  Completed  . DEXA SCAN  Completed  . ZOSTAVAX  Completed  . PNA vac Low Risk Adult  Completed      Plan:  I have personally reviewed and addressed the Medicare Annual Wellness questionnaire and have noted the following in the patient's chart:  A. Medical and social history B. Use of alcohol, tobacco or illicit drugs  C. Current medications and supplements D. Functional ability and status E.  Nutritional status F.  Physical activity G. Advance directives H. List of other physicians I.  Hospitalizations, surgeries, and ER visits in previous 12  months J.  [place] such as hearing and vision if needed, cognitive and depression L. Referrals and appointments - none  In addition, I have reviewed and discussed with patient certain preventive protocols, quality metrics, and best practice recommendations. A written personalized care plan for preventive services as well as general preventive health recommendations were provided to patient.  See attached scanned questionnaire for additional information.   Signed,  Fabio Neighbors, LPN Nurse Health Advisor

## 2016-10-06 ENCOUNTER — Telehealth: Payer: Self-pay

## 2016-10-06 LAB — LIPID PANEL
Chol/HDL Ratio: 5.9 ratio units — ABNORMAL HIGH (ref 0.0–4.4)
Cholesterol, Total: 188 mg/dL (ref 100–199)
HDL: 32 mg/dL — AB (ref >39)
LDL Calculated: 92 mg/dL (ref 0–99)
TRIGLYCERIDES: 318 mg/dL — AB (ref 0–149)
VLDL CHOLESTEROL CAL: 64 mg/dL — AB (ref 5–40)

## 2016-10-06 NOTE — Telephone Encounter (Signed)
Patient has been advised. KW 

## 2016-10-06 NOTE — Telephone Encounter (Signed)
-----   Message from Birdie Sons, MD sent at 10/06/2016  8:00 AM EDT ----- Cholesterol is very good at 188. Triglycerides are a little high at 318, but his is not high enough to require additional medications. Continue current medications.  Follow up 6 months.

## 2016-10-17 ENCOUNTER — Telehealth: Payer: Self-pay | Admitting: Family Medicine

## 2016-10-17 DIAGNOSIS — R197 Diarrhea, unspecified: Secondary | ICD-10-CM

## 2016-10-17 MED ORDER — DICYCLOMINE HCL 10 MG PO CAPS: 10.0000 mg | ORAL_CAPSULE | Freq: Three times a day (TID) | ORAL | 1 refills | Status: DC

## 2016-10-17 NOTE — Telephone Encounter (Signed)
Please review. KW 

## 2016-10-17 NOTE — Telephone Encounter (Signed)
Pt contacted office for refill request on the following medications:  dicyclomine (BENTYL) 10 MG capsule.  [facility].  CB#[phone]/MW

## 2016-11-04 ENCOUNTER — Telehealth: Payer: Self-pay | Admitting: Family Medicine

## 2016-11-04 NOTE — Telephone Encounter (Signed)
Tamika at [facility] calling to give the update on an appeal for patients prescription  Dicyclomine. HCL  Please call her back  [phone]  Thanks tri

## 2016-11-04 NOTE — Telephone Encounter (Incomplete)
Spoke with Julia Salazar and she states that medication did not get approved because it is a Part D medication on her insurance.

## 2016-11-04 NOTE — Telephone Encounter (Signed)
Spoke with Tamika and she states that medication did not get approved because it is a Part D medication on her insurance. She states [facility] can call [facility] at [phone] and see if there is a discount they can give her on the medication. Tamika said she would call patient and let her know about everything-aa

## 2016-11-07 ENCOUNTER — Other Ambulatory Visit: Payer: Self-pay | Admitting: Family Medicine

## 2016-11-07 DIAGNOSIS — M1712 Unilateral primary osteoarthritis, left knee: Secondary | ICD-10-CM

## 2016-11-07 MED ORDER — HYDROCODONE-ACETAMINOPHEN 5-325 MG PO TABS: 1.0000 | ORAL_TABLET | Freq: Four times a day (QID) | ORAL | 0 refills | Status: DC | PRN

## 2016-11-07 NOTE — Telephone Encounter (Signed)
Pt contacted office for refill request on the following medications: HYDROcodone-acetaminophen (NORCO/VICODIN) 5-325 MG tablet Last Rx: 07/26/16 Last OV:10/05/16 I asked pt again to make sure it was the 5-325 mg that she was requesting b/c she has also gotten 7.5-325 mg in the past. Pt confirmed that she is requesting an Rx for HYDROcodone-acetaminophen (NORCO/VICODIN) 5-325 MG tablet. Please advise. Thanks TNP

## 2016-11-23 ENCOUNTER — Other Ambulatory Visit: Payer: Self-pay | Admitting: Family Medicine

## 2016-12-07 ENCOUNTER — Other Ambulatory Visit: Payer: Self-pay | Admitting: Family Medicine

## 2016-12-07 DIAGNOSIS — M1712 Unilateral primary osteoarthritis, left knee: Secondary | ICD-10-CM

## 2016-12-07 DIAGNOSIS — R197 Diarrhea, unspecified: Secondary | ICD-10-CM

## 2016-12-07 MED ORDER — DICYCLOMINE HCL 10 MG PO CAPS: 10.0000 mg | ORAL_CAPSULE | Freq: Three times a day (TID) | ORAL | 4 refills | Status: DC

## 2016-12-07 NOTE — Telephone Encounter (Signed)
Pt contacted office for refill request on the following medications:  dicyclomine (BENTYL) 10 MG capsule.  [facility] mail order.  CB#[phone]/MW  Pt also request a refill for HYDROcodone-acetaminophen (NORCO) 7.5-325 MG tablet/MW

## 2016-12-09 MED ORDER — DICYCLOMINE HCL 10 MG PO CAPS: 10.0000 mg | ORAL_CAPSULE | Freq: Three times a day (TID) | ORAL | 4 refills | Status: DC

## 2016-12-09 MED ORDER — HYDROCODONE-ACETAMINOPHEN 7.5-325 MG PO TABS: 1.0000 | ORAL_TABLET | Freq: Four times a day (QID) | ORAL | 0 refills | Status: DC | PRN

## 2016-12-09 NOTE — Addendum Note (Signed)
Addended by: Silvio Clayman on: 12/09/2016 02:10 PM   Modules accepted: Orders

## 2016-12-09 NOTE — Telephone Encounter (Signed)
Pt is calling to see if she can pick up Rx today?/MW

## 2016-12-13 ENCOUNTER — Other Ambulatory Visit: Payer: Self-pay | Admitting: *Deleted

## 2016-12-13 DIAGNOSIS — R197 Diarrhea, unspecified: Secondary | ICD-10-CM

## 2016-12-13 NOTE — Telephone Encounter (Signed)
Requesting 90 day supply.

## 2016-12-19 ENCOUNTER — Other Ambulatory Visit: Payer: Self-pay | Admitting: *Deleted

## 2016-12-19 MED ORDER — CARISOPRODOL 350 MG PO TABS: 350.0000 mg | ORAL_TABLET | Freq: Four times a day (QID) | ORAL | 1 refills | Status: DC

## 2016-12-19 NOTE — Telephone Encounter (Signed)
Please call in soma 

## 2016-12-20 NOTE — Telephone Encounter (Signed)
Called in Rx as below.  

## 2016-12-23 ENCOUNTER — Other Ambulatory Visit: Payer: Self-pay | Admitting: Family Medicine

## 2017-01-06 ENCOUNTER — Other Ambulatory Visit: Payer: Self-pay | Admitting: Family Medicine

## 2017-02-14 ENCOUNTER — Other Ambulatory Visit: Payer: Self-pay

## 2017-02-14 MED ORDER — HYDROCODONE-ACETAMINOPHEN 5-325 MG PO TABS: 1.0000 | ORAL_TABLET | Freq: Four times a day (QID) | ORAL | 0 refills | Status: DC | PRN

## 2017-02-14 NOTE — Telephone Encounter (Signed)
Patient advised RX is ready for pick up.  

## 2017-02-14 NOTE — Telephone Encounter (Signed)
Last refill 11/07/2016. LOV 10/05/2016. Renaldo Fiddler, CMA

## 2017-03-08 ENCOUNTER — Other Ambulatory Visit: Payer: Self-pay

## 2017-03-14 ENCOUNTER — Other Ambulatory Visit: Payer: Self-pay | Admitting: Family Medicine

## 2017-03-14 DIAGNOSIS — M1712 Unilateral primary osteoarthritis, left knee: Secondary | ICD-10-CM

## 2017-03-14 MED ORDER — HYDROCODONE-ACETAMINOPHEN 7.5-325 MG PO TABS: 1.0000 | ORAL_TABLET | Freq: Four times a day (QID) | ORAL | 0 refills | Status: DC | PRN

## 2017-03-14 NOTE — Telephone Encounter (Signed)
Pt needs refill on hydrocodone 7.5  Not the HYDROcodone-acetaminophen (NORCO/VICODIN) 5-325 MG tablet  Any questions call [phone]  Thank sTeri

## 2017-03-15 ENCOUNTER — Other Ambulatory Visit: Payer: Self-pay | Admitting: Family Medicine

## 2017-03-16 ENCOUNTER — Other Ambulatory Visit: Payer: Self-pay | Admitting: Family Medicine

## 2017-03-30 ENCOUNTER — Other Ambulatory Visit: Payer: Self-pay | Admitting: Family Medicine

## 2017-03-30 DIAGNOSIS — Z1231 Encounter for screening mammogram for malignant neoplasm of breast: Secondary | ICD-10-CM

## 2017-04-25 ENCOUNTER — Ambulatory Visit
Admission: RE | Admit: 2017-04-25 | Discharge: 2017-04-25 | Disposition: A | Discharge status: Home / Self Care | Payer: Medicare Other | Source: Ambulatory Visit | Attending: Family Medicine | Admitting: Family Medicine

## 2017-04-25 DIAGNOSIS — Z1231 Encounter for screening mammogram for malignant neoplasm of breast: Secondary | ICD-10-CM | POA: Insufficient documentation

## 2017-05-09 ENCOUNTER — Telehealth: Payer: Self-pay | Admitting: Family Medicine

## 2017-05-09 MED ORDER — FLUOXETINE HCL 10 MG PO CAPS: 10.0000 mg | ORAL_CAPSULE | Freq: Two times a day (BID) | ORAL | 4 refills | Status: DC

## 2017-05-09 MED ORDER — FLUOXETINE HCL 10 MG PO CAPS: 10.0000 mg | ORAL_CAPSULE | Freq: Two times a day (BID) | ORAL | 0 refills | Status: DC

## 2017-05-09 NOTE — Telephone Encounter (Signed)
Ok to send in as requested? Renaldo Fiddler, CMA

## 2017-05-09 NOTE — Telephone Encounter (Signed)
Pt stated that she just got off the phone with [facility] and was advised that they have no record of receiving an Rx for FLUoxetine (PROZAC) 10 MG capsule. Our records show that it was sent on 03/16/17. Pt is requesting that we send an Rx to [place] to last until mail order can get to her and we resend the Rx for 90 day supply to [facility]. Pt stated she is out of the medication. Please advise. Thanks TNP

## 2017-05-11 ENCOUNTER — Other Ambulatory Visit: Payer: Self-pay | Admitting: Family Medicine

## 2017-05-11 MED ORDER — FLUOXETINE HCL 20 MG PO CAPS: 20.0000 mg | ORAL_CAPSULE | Freq: Two times a day (BID) | ORAL | 4 refills | Status: DC

## 2017-05-11 NOTE — Progress Notes (Signed)
Change from10mg  bid to 20mg  QD due to insurance

## 2017-05-24 ENCOUNTER — Other Ambulatory Visit: Payer: Self-pay | Admitting: Family Medicine

## 2017-05-24 MED ORDER — HYDROCODONE-ACETAMINOPHEN 5-325 MG PO TABS: 1.0000 | ORAL_TABLET | Freq: Four times a day (QID) | ORAL | 0 refills | Status: DC | PRN

## 2017-05-24 NOTE — Telephone Encounter (Signed)
Pt contacted office for refill request on the following medications:  CB#[phone]/MW  HYDROcodone-acetaminophen (NORCO/VICODIN) 5-325 MG tablet.

## 2017-06-21 ENCOUNTER — Other Ambulatory Visit: Payer: Self-pay | Admitting: Family Medicine

## 2017-06-21 DIAGNOSIS — M1712 Unilateral primary osteoarthritis, left knee: Secondary | ICD-10-CM

## 2017-06-21 MED ORDER — HYDROCODONE-ACETAMINOPHEN 7.5-325 MG PO TABS: 1.0000 | ORAL_TABLET | Freq: Four times a day (QID) | ORAL | 0 refills | Status: DC | PRN

## 2017-06-21 NOTE — Telephone Encounter (Signed)
Pt contacted office for refill request on the following medications:  CB#[phone]/MW    HYDROcodone-acetaminophen (NORCO) 7.5-325 MG tablet

## 2017-08-30 ENCOUNTER — Other Ambulatory Visit: Payer: Self-pay | Admitting: Family Medicine

## 2017-08-30 DIAGNOSIS — R197 Diarrhea, unspecified: Secondary | ICD-10-CM

## 2017-08-30 MED ORDER — HYDROCODONE-ACETAMINOPHEN 5-325 MG PO TABS: 1.0000 | ORAL_TABLET | Freq: Four times a day (QID) | ORAL | 0 refills | Status: DC | PRN

## 2017-08-30 MED ORDER — DIPHENOXYLATE-ATROPINE 2.5-0.025 MG PO TABS: 1.0000 | ORAL_TABLET | Freq: Four times a day (QID) | ORAL | 1 refills | Status: DC | PRN

## 2017-08-30 NOTE — Telephone Encounter (Signed)
Pt contacted office for refill request on the following medications:  HYDROcodone-acetaminophen (NORCO/VICODIN) 5-325 MG tablet  diphenoxylate-atropine (LOMOTIL) 2.5-0.025 MG tablet  CB#[phone]/MW

## 2017-09-01 ENCOUNTER — Other Ambulatory Visit: Payer: Self-pay | Admitting: *Deleted

## 2017-09-01 DIAGNOSIS — R197 Diarrhea, unspecified: Secondary | ICD-10-CM

## 2017-09-01 MED ORDER — HYDROCODONE-ACETAMINOPHEN 5-325 MG PO TABS: 1.0000 | ORAL_TABLET | Freq: Four times a day (QID) | ORAL | 0 refills | Status: DC | PRN

## 2017-09-01 MED ORDER — DIPHENOXYLATE-ATROPINE 2.5-0.025 MG PO TABS: 1.0000 | ORAL_TABLET | Freq: Four times a day (QID) | ORAL | 1 refills | Status: DC | PRN

## 2017-09-04 ENCOUNTER — Other Ambulatory Visit: Payer: Self-pay | Admitting: Family Medicine

## 2017-09-04 MED ORDER — CARISOPRODOL 350 MG PO TABS: 350.0000 mg | ORAL_TABLET | Freq: Four times a day (QID) | ORAL | 1 refills | Status: DC

## 2017-09-04 NOTE — Telephone Encounter (Signed)
Please call in Soma 

## 2017-09-04 NOTE — Telephone Encounter (Signed)
Pt contacted office for refill request on the following medications:  carisoprodol (SOMA) 350 MG tablet.    [facility] mail order/MW

## 2017-09-05 MED ORDER — CARISOPRODOL 350 MG PO TABS: 350.0000 mg | ORAL_TABLET | Freq: Four times a day (QID) | ORAL | 1 refills | Status: DC

## 2017-09-05 NOTE — Addendum Note (Signed)
Addended by: Julieta Bellini on: 09/05/2017 08:29 AM   Modules accepted: Orders

## 2017-09-05 NOTE — Telephone Encounter (Signed)
Rx faxed

## 2017-09-26 ENCOUNTER — Encounter: Payer: Self-pay | Admitting: Family Medicine

## 2017-09-27 ENCOUNTER — Other Ambulatory Visit: Payer: Self-pay | Admitting: Physician Assistant

## 2017-10-02 ENCOUNTER — Other Ambulatory Visit: Payer: Self-pay | Admitting: Family Medicine

## 2017-10-02 DIAGNOSIS — M1712 Unilateral primary osteoarthritis, left knee: Secondary | ICD-10-CM

## 2017-10-02 MED ORDER — HYDROCODONE-ACETAMINOPHEN 7.5-325 MG PO TABS: 1.0000 | ORAL_TABLET | Freq: Four times a day (QID) | ORAL | 0 refills | Status: DC | PRN

## 2017-10-02 NOTE — Telephone Encounter (Signed)
Pt requesting refill of Hydrocodone 7.5 mg.  Pt states she is not totally out yet.  Would like a call when ready for pick up.

## 2017-11-20 ENCOUNTER — Other Ambulatory Visit: Payer: Self-pay | Admitting: Family Medicine

## 2017-12-07 ENCOUNTER — Other Ambulatory Visit: Payer: Self-pay | Admitting: Family Medicine

## 2017-12-07 DIAGNOSIS — R197 Diarrhea, unspecified: Secondary | ICD-10-CM

## 2017-12-07 DIAGNOSIS — M1712 Unilateral primary osteoarthritis, left knee: Secondary | ICD-10-CM

## 2017-12-07 NOTE — Telephone Encounter (Signed)
Patient needs Hydrocodone and Generic Lomotil sent to [facility]

## 2017-12-08 MED ORDER — DIPHENOXYLATE-ATROPINE 2.5-0.025 MG PO TABS: 1.0000 | ORAL_TABLET | Freq: Four times a day (QID) | ORAL | 1 refills | Status: DC | PRN

## 2017-12-08 NOTE — Telephone Encounter (Signed)
Please verify if she needs the 5mg  or the 7.5mg  hydrocodone sent to [facility].

## 2017-12-11 MED ORDER — HYDROCODONE-ACETAMINOPHEN 5-325 MG PO TABS: 1.0000 | ORAL_TABLET | Freq: Four times a day (QID) | ORAL | 0 refills | Status: DC | PRN

## 2017-12-11 NOTE — Telephone Encounter (Signed)
Patient reports that she needs the 5mg  tablet.

## 2017-12-25 ENCOUNTER — Telehealth: Payer: Self-pay | Admitting: Family Medicine

## 2017-12-25 MED ORDER — CANDESARTAN CILEXETIL 8 MG PO TABS: 8.0000 mg | ORAL_TABLET | Freq: Every day | ORAL | 3 refills | Status: DC

## 2017-12-25 NOTE — Telephone Encounter (Signed)
[facility] has pulled her RX for Losartan and told her she need to contact her provider to have another medication called in since Losartan has been recalled.

## 2017-12-25 NOTE — Telephone Encounter (Signed)
Have sent prescription for candesartan to take the place of losartan

## 2017-12-25 NOTE — Telephone Encounter (Signed)
Please advie

## 2017-12-25 NOTE — Telephone Encounter (Signed)
Patient was notified.

## 2018-01-01 ENCOUNTER — Other Ambulatory Visit: Payer: Self-pay | Admitting: Family Medicine

## 2018-01-09 ENCOUNTER — Other Ambulatory Visit: Payer: Self-pay | Admitting: Family Medicine

## 2018-01-09 DIAGNOSIS — M1712 Unilateral primary osteoarthritis, left knee: Secondary | ICD-10-CM

## 2018-01-09 MED ORDER — HYDROCODONE-ACETAMINOPHEN 7.5-325 MG PO TABS: 1.0000 | ORAL_TABLET | Freq: Four times a day (QID) | ORAL | 0 refills | Status: DC | PRN

## 2018-01-09 NOTE — Telephone Encounter (Signed)
Needs refill on Hydrocodone 7.5-325 mg. Sent to [facility]

## 2018-02-08 ENCOUNTER — Ambulatory Visit (INDEPENDENT_AMBULATORY_CARE_PROVIDER_SITE_OTHER): Payer: Medicare Other | Admitting: Family Medicine

## 2018-02-08 ENCOUNTER — Ambulatory Visit (INDEPENDENT_AMBULATORY_CARE_PROVIDER_SITE_OTHER): Payer: Medicare Other

## 2018-02-08 VITALS — BP 124/64 | HR 64 | Temp 97.7°F | Ht 59.0 in | Wt 145.0 lb

## 2018-02-08 DIAGNOSIS — R197 Diarrhea, unspecified: Secondary | ICD-10-CM

## 2018-02-08 DIAGNOSIS — M1712 Unilateral primary osteoarthritis, left knee: Secondary | ICD-10-CM

## 2018-02-08 DIAGNOSIS — Z803 Family history of malignant neoplasm of breast: Secondary | ICD-10-CM

## 2018-02-08 DIAGNOSIS — R5382 Chronic fatigue, unspecified: Secondary | ICD-10-CM

## 2018-02-08 DIAGNOSIS — Z6829 Body mass index (BMI) 29.0-29.9, adult: Secondary | ICD-10-CM | POA: Diagnosis not present

## 2018-02-08 DIAGNOSIS — E559 Vitamin D deficiency, unspecified: Secondary | ICD-10-CM | POA: Diagnosis not present

## 2018-02-08 DIAGNOSIS — G4700 Insomnia, unspecified: Secondary | ICD-10-CM

## 2018-02-08 DIAGNOSIS — Z0000 Encounter for general adult medical examination without abnormal findings: Secondary | ICD-10-CM | POA: Diagnosis not present

## 2018-02-08 DIAGNOSIS — E782 Mixed hyperlipidemia: Secondary | ICD-10-CM | POA: Diagnosis not present

## 2018-02-08 DIAGNOSIS — I10 Essential (primary) hypertension: Secondary | ICD-10-CM | POA: Diagnosis not present

## 2018-02-08 MED ORDER — METHYLPREDNISOLONE ACETATE 80 MG/ML IJ SUSP: 80.0000 mg | Freq: Once | INTRAMUSCULAR | Status: DC

## 2018-02-08 MED ORDER — RIFAXIMIN 550 MG PO TABS: 550.0000 mg | ORAL_TABLET | Freq: Three times a day (TID) | ORAL | 0 refills | Status: DC

## 2018-02-08 MED ORDER — ZOLPIDEM TARTRATE 5 MG PO TABS: 5.0000 mg | ORAL_TABLET | Freq: Every evening | ORAL | 1 refills | Status: DC | PRN

## 2018-02-08 NOTE — Patient Instructions (Addendum)
Julia Salazar , Thank you for taking time to come for your Medicare Wellness Visit. I appreciate your ongoing commitment to your health goals. Please review the following plan we discussed and let me know if I can assist you in the future.   Screening recommendations/referrals: Colonoscopy: Up to date Mammogram: Up to date Bone Density: Currently due (5 year repeat)- pt declined today. Recommended yearly ophthalmology/optometry visit for glaucoma screening and checkup Recommended yearly dental visit for hygiene and checkup  Vaccinations: Influenza vaccine: Up to date Pneumococcal vaccine: Up to date Tdap vaccine: Up to date Shingles vaccine: Pt declines today.     Advanced directives: Already on file.  Conditions/risks identified: Obesity- recommend to cut out all foods that are fried in diet.   Next appointment: 9:00 AM with Dr Caryn Section.   Preventive Care 29 Years and Older, Female Preventive care refers to lifestyle choices and visits with your health care provider that can promote health and wellness. What does preventive care include?  A yearly physical exam. This is also called an annual well check.  Dental exams once or twice a year.  Routine eye exams. Ask your health care provider how often you should have your eyes checked.  Personal lifestyle choices, including:  Daily care of your teeth and gums.  Regular physical activity.  Eating a healthy diet.  Avoiding tobacco and drug use.  Limiting alcohol use.  Practicing safe sex.  Taking low-dose aspirin every day.  Taking vitamin and mineral supplements as recommended by your health care provider. What happens during an annual well check? The services and screenings done by your health care provider during your annual well check will depend on your age, overall health, lifestyle risk factors, and family history of disease. Counseling  Your health care provider may ask you questions about your:  Alcohol  use.  Tobacco use.  Drug use.  Emotional well-being.  Home and relationship well-being.  Sexual activity.  Eating habits.  History of falls.  Memory and ability to understand (cognition).  Work and work Statistician.  Reproductive health. Screening  You may have the following tests or measurements:  Height, weight, and BMI.  Blood pressure.  Lipid and cholesterol levels. These may be checked every 5 years, or more frequently if you are over 90 years old.  Skin check.  Lung cancer screening. You may have this screening every year starting at age 36 if you have a 30-pack-year history of smoking and currently smoke or have quit within the past 15 years.  Fecal occult blood test (FOBT) of the stool. You may have this test every year starting at age 25.  Flexible sigmoidoscopy or colonoscopy. You may have a sigmoidoscopy every 5 years or a colonoscopy every 10 years starting at age 27.  Hepatitis C blood test.  Hepatitis B blood test.  Sexually transmitted disease (STD) testing.  Diabetes screening. This is done by checking your blood sugar (glucose) after you have not eaten for a while (fasting). You may have this done every 1-3 years.  Bone density scan. This is done to screen for osteoporosis. You may have this done starting at age 75.  Mammogram. This may be done every 1-2 years. Talk to your health care provider about how often you should have regular mammograms. Talk with your health care provider about your test results, treatment options, and if necessary, the need for more tests. Vaccines  Your health care provider may recommend certain vaccines, such as:  Influenza vaccine. This is  recommended every year.  Tetanus, diphtheria, and acellular pertussis (Tdap, Td) vaccine. You may need a Td booster every 10 years.  Zoster vaccine. You may need this after age 38.  Pneumococcal 13-valent conjugate (PCV13) vaccine. One dose is recommended after age  62.  Pneumococcal polysaccharide (PPSV23) vaccine. One dose is recommended after age 50. Talk to your health care provider about which screenings and vaccines you need and how often you need them. This information is not intended to replace advice given to you by your health care provider. Make sure you discuss any questions you have with your health care provider. Document Released: 12/18/2015 Document Revised: 08/10/2016 Document Reviewed: 09/22/2015 Elsevier Interactive Patient Education  2017 [place] Prevention in the Home Falls can cause injuries. They can happen to people of all ages. There are many things you can do to make your home safe and to help prevent falls. What can I do on the outside of my home?  Regularly fix the edges of walkways and driveways and fix any cracks.  Remove anything that might make you trip as you walk through a door, such as a raised step or threshold.  Trim any bushes or trees on the path to your home.  Use bright outdoor lighting.  Clear any walking paths of anything that might make someone trip, such as rocks or tools.  Regularly check to see if handrails are loose or broken. Make sure that both sides of any steps have handrails.  Any raised decks and porches should have guardrails on the edges.  Have any leaves, snow, or ice cleared regularly.  Use sand or salt on walking paths during winter.  Clean up any spills in your garage right away. This includes oil or grease spills. What can I do in the bathroom?  Use night lights.  Install grab bars by the toilet and in the tub and shower. Do not use towel bars as grab bars.  Use non-skid mats or decals in the tub or shower.  If you need to sit down in the shower, use a plastic, non-slip stool.  Keep the floor dry. Clean up any water that spills on the floor as soon as it happens.  Remove soap buildup in the tub or shower regularly.  Attach bath mats securely with double-sided  non-slip rug tape.  Do not have throw rugs and other things on the floor that can make you trip. What can I do in the bedroom?  Use night lights.  Make sure that you have a light by your bed that is easy to reach.  Do not use any sheets or blankets that are too big for your bed. They should not hang down onto the floor.  Have a firm chair that has side arms. You can use this for support while you get dressed.  Do not have throw rugs and other things on the floor that can make you trip. What can I do in the kitchen?  Clean up any spills right away.  Avoid walking on wet floors.  Keep items that you use a lot in easy-to-reach places.  If you need to reach something above you, use a strong step stool that has a grab bar.  Keep electrical cords out of the way.  Do not use floor polish or wax that makes floors slippery. If you must use wax, use non-skid floor wax.  Do not have throw rugs and other things on the floor that can make you trip.  What can I do with my stairs?  Do not leave any items on the stairs.  Make sure that there are handrails on both sides of the stairs and use them. Fix handrails that are broken or loose. Make sure that handrails are as long as the stairways.  Check any carpeting to make sure that it is firmly attached to the stairs. Fix any carpet that is loose or worn.  Avoid having throw rugs at the top or bottom of the stairs. If you do have throw rugs, attach them to the floor with carpet tape.  Make sure that you have a light switch at the top of the stairs and the bottom of the stairs. If you do not have them, ask someone to add them for you. What else can I do to help prevent falls?  Wear shoes that:  Do not have high heels.  Have rubber bottoms.  Are comfortable and fit you well.  Are closed at the toe. Do not wear sandals.  If you use a stepladder:  Make sure that it is fully opened. Do not climb a closed stepladder.  Make sure that both  sides of the stepladder are locked into place.  Ask someone to hold it for you, if possible.  Clearly mark and make sure that you can see:  Any grab bars or handrails.  First and last steps.  Where the edge of each step is.  Use tools that help you move around (mobility aids) if they are needed. These include:  Canes.  Walkers.  Scooters.  Crutches.  Turn on the lights when you go into a dark area. Replace any light bulbs as soon as they burn out.  Set up your furniture so you have a clear path. Avoid moving your furniture around.  If any of your floors are uneven, fix them.  If there are any pets around you, be aware of where they are.  Review your medicines with your doctor. Some medicines can make you feel dizzy. This can increase your chance of falling. Ask your doctor what other things that you can do to help prevent falls. This information is not intended to replace advice given to you by your health care provider. Make sure you discuss any questions you have with your health care provider. Document Released: 09/17/2009 Document Revised: 04/28/2016 Document Reviewed: 12/26/2014 Elsevier Interactive Patient Education  2017 [employer].

## 2018-02-08 NOTE — Patient Instructions (Addendum)
   The CDC recommends two doses of Shingrix (the shingles vaccine) separated by 2 to 6 months for adults age 82 years and older. I recommend checking with your insurance plan regarding coverage for this vaccine.    Call for prescription of Xifaxan if you are doing better after taking samples

## 2018-02-08 NOTE — Progress Notes (Signed)
Subjective:   Julia Salazar is a 84 y.o. female who presents for Medicare Annual (Subsequent) preventive examination.  Review of Systems:  N/A  Cardiac Risk Factors include: advanced age (>68men, >93 women);dyslipidemia;hypertension;obesity (BMI >30kg/m2)     Objective:     Vitals: BP 124/64 (BP Location: Left Arm)   Pulse 64   Temp 97.7 F (36.5 C) (Oral)   Ht 4\' 11"  (1.499 m)   Wt 145 lb (65.8 kg)   BMI 29.29 kg/m   Body mass index is 29.29 kg/m.  Advanced Directives 02/08/2018 10/05/2016  Does Patient Have a Medical Advance Directive? Yes Yes  Type of Paramedic of [place];Living will;Out of facility DNR (pink MOST or yellow form) [place];Living will  Copy of [place] in Chart? Yes Yes    Tobacco Social History   Tobacco Use  Smoking Status Never Smoker  Smokeless Tobacco Never Used     Counseling given: Not Answered   Clinical Intake:  Pre-visit preparation completed: Yes  Pain : 0-10 Pain Score: 5  Pain Location: Back Pain Orientation: Lower Pain Descriptors / Indicators: Aching Pain Frequency: Constant     Nutritional Status: BMI 25 -29 Overweight Nutritional Risks: Nausea/ vomitting/ diarrhea(Diarrhea intermittenly (with medication)- unknown cause) Diabetes: No  How often do you need to have someone help you when you read instructions, pamphlets, or other written materials from your doctor or pharmacy?: 3 - Sometimes  Interpreter Needed?: No  Information entered by :: [facility], LPN  Past Medical History:  Diagnosis Date  . Depression   . Diverticulosis   . History of chicken pox   . History of measles   . History of mumps   . Hyperlipidemia   . Hypertension   . Ovarian cancer ([place]) 1995   Past Surgical History:  Procedure Laterality Date  . ABDOMINAL HYSTERECTOMY     1973  . APPENDECTOMY  1986  . BACK SURGERY  05/2012   done at [facility] in [place]  .  BILATERAL SALPINGOOPHORECTOMY  1986  . CATARACT EXTRACTION  2010   right eye  . CHOLECYSTECTOMY  1998  . COLON RESECTION  1995   Family History  Problem Relation Age of Onset  . Colon cancer Mother   . Heart attack Father   . Breast cancer Sister 34  . Leukemia Sister    Social History   Socioeconomic History  . Marital status: Married    Spouse name: None  . Number of children: 3  . Years of education: LPN  . Highest education level: Associate degree: occupational, Hotel manager, or vocational program  Social Needs  . Financial resource strain: Not hard at all  . Food insecurity - worry: Never true  . Food insecurity - inability: Never true  . Transportation needs - medical: No  . Transportation needs - non-medical: No  Occupational History  . Occupation: retired  Tobacco Use  . Smoking status: Never Smoker  . Smokeless tobacco: Never Used  Substance and Sexual Activity  . Alcohol use: No    Alcohol/week: 0.0 oz  . Drug use: No  . Sexual activity: None  Other Topics Concern  . None  Social History Narrative  . None    Outpatient Encounter Medications as of 02/08/2018  Medication Sig  . amLODipine (NORVASC) 10 MG tablet TAKE 1 TABLET DAILY  . aspirin 81 MG tablet Take 1 tablet by mouth daily.  . bimatoprost (LUMIGAN) 0.03 % ophthalmic solution daily.  Marland Kitchen  Calcium Carb-Cholecalciferol (CALCIUM 600 + D) 600-200 MG-UNIT TABS Take 1 tablet by mouth daily.  . candesartan (ATACAND) 8 MG tablet Take 1 tablet (8 mg total) by mouth daily.  . carisoprodol (SOMA) 350 MG tablet Take 1 tablet (350 mg total) by mouth every 6 (six) hours.  . diphenhydrAMINE (BENADRYL ALLERGY) 25 mg capsule Take 25 mg by mouth at bedtime.   . diphenoxylate-atropine (LOMOTIL) 2.5-0.025 MG tablet Take 1 tablet by mouth 4 (four) times daily as needed.  Marland Kitchen FLUoxetine (PROZAC) 20 MG capsule Take 1 capsule (20 mg total) by mouth 2 (two) times daily. (Patient taking differently: Take 20 mg by mouth daily. )  .  furosemide (LASIX) 40 MG tablet TAKE 1 TABLET DAILY  . hydrochlorothiazide (HYDRODIURIL) 12.5 MG tablet TAKE 1 TABLET DAILY  . HYDROcodone-acetaminophen (NORCO) 7.5-325 MG tablet Take 1 tablet by mouth 4 (four) times daily as needed for moderate pain.  Marland Kitchen HYDROcodone-acetaminophen (NORCO/VICODIN) 5-325 MG tablet Take 1 tablet by mouth every 6 (six) hours as needed for moderate pain.  . meloxicam (MOBIC) 15 MG tablet TAKE 1 TABLET DAILY  . Multiple Vitamins-Minerals (MULTIVITAMIN ADULT PO) Take 1 tablet by mouth daily.  Marland Kitchen omega-3 acid ethyl esters (LOVAZA) 1 g capsule TAKE 4 CAPSULES DAILY  . potassium chloride SA (K-DUR,KLOR-CON) 20 MEQ tablet TAKE 1 TABLET DAILY  . brimonidine (ALPHAGAN P) 0.1 % SOLN Apply to eye daily.  Marland Kitchen dicyclomine (BENTYL) 10 MG capsule Take 1 capsule (10 mg total) by mouth 4 (four) times daily -  before meals and at bedtime. As needed for bowel movements (Patient not taking: Reported on 02/08/2018)  . dorzolamide-timolol (COSOPT) 22.3-6.8 MG/ML ophthalmic solution Place into both eyes 2 (two) times daily.  . pravastatin (PRAVACHOL) 40 MG tablet TAKE 1 TABLET DAILY (Patient not taking: Reported on 02/08/2018)  . Probiotic Product (DIGESTIVE ADVANTAGE GUMMIES PO) Take by mouth daily.   No facility-administered encounter medications on file as of 02/08/2018.     Activities of Daily Living In your present state of health, do you have any difficulty performing the following activities: 02/08/2018  Hearing? N  Vision? 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 N  Walking or climbing stairs? Y  Comment Due to balence issue and knee or back pain.  Dressing or bathing? N  Doing errands, shopping? Y  Preparing Food and eating ? N  Using the Toilet? N  In the past six months, have you accidently leaked urine? Y  Comment Wears protection daily.   Do you have problems with loss of bowel control? Y  Managing your  Medications? Y  Comment Wears protection daily.   Managing your Finances? N  Housekeeping or managing your Housekeeping? Y  Some recent data might be hidden    Patient Care Team: Birdie Sons, MD as PCP - General (Family Medicine) Oneta Rack, MD (Dermatology) Lorelee Cover., MD as Consulting Physician (Ophthalmology) Conception Chancy, DDS as Referring Physician (Dentistry) Vevelyn Royals, MD as Consulting Physician (Ophthalmology)    Assessment:   This is a routine wellness examination for Julia Salazar.  Exercise Activities and Dietary recommendations Current Exercise Habits: The patient does not participate in regular exercise at present, Exercise limited by: orthopedic condition(s)  Goals    . DIET - DECREASE FRIED FOODS     Recommend to cut out all foods that are fried in diet.        Fall Risk Fall Risk  02/08/2018 10/05/2016 11/24/2015  Falls in the past year? No No Yes  Number falls in past yr: - - 2 or more  Injury with Fall? - - Yes   Is the patient's home free of loose throw rugs in walkways, pet beds, electrical cords, etc?   yes      Grab bars in the bathroom? yes      Handrails on the stairs?   yes      Adequate lighting?   yes  Timed Get Up and Go performed: N/A  Depression Screen PHQ 2/9 Scores 02/08/2018 02/08/2018 10/05/2016 11/24/2015  PHQ - 2 Score 0 0 1 0  PHQ- 9 Score 1 - - 4     Cognitive Function     6CIT Screen 02/08/2018 10/05/2016  What Year? 0 points 0 points  What month? 0 points 0 points  What time? 0 points 0 points  Count back from 20 0 points 0 points  Months in reverse 0 points 0 points  Repeat phrase 2 points 2 points  Total Score 2 2    Immunization History  Administered Date(s) Administered  . Influenza Split 08/09/2011  . Influenza, High Dose Seasonal PF 08/24/2015, 09/21/2016  . Influenza-Unspecified 09/25/2017  . Pneumococcal Conjugate-13 11/03/2014  . Pneumococcal Polysaccharide-23 10/17/2012  . Td 11/03/2014  .  Zoster 11/07/2013    Qualifies for Shingles Vaccine? Due for Shingles vaccine. Declined my offer to administer today. Education has been provided regarding the importance of this vaccine. Pt has been advised to call her insurance company to determine her out of pocket expense. Advised she may also receive this vaccine at her local pharmacy or Health Dept. Verbalized acceptance and understanding.  Screening Tests Health Maintenance  Topic Date Due  . DEXA SCAN  10/24/2017  . TETANUS/TDAP  11/03/2024  . INFLUENZA VACCINE  Completed  . PNA vac Low Risk Adult  Completed    Cancer Screenings: Lung: Low Dose CT Chest recommended if Age 76-80 years, 30 pack-year currently smoking OR have quit w/in 15years. Patient does not qualify. Breast:  Up to date on Mammogram? Yes   Up to date of Bone Density/Dexa? No, pt declined order for this. Colorectal: Up to date  Additional Screenings:  Hepatitis C Screening: N/A     Plan:  I have personally reviewed and addressed the Medicare Annual Wellness questionnaire and have noted the following in the patient's chart:  A. Medical and social history B. Use of alcohol, tobacco or illicit drugs  C. Current medications and supplements D. Functional ability and status E.  Nutritional status F.  Physical activity G. Advance directives H. List of other physicians I.  Hospitalizations, surgeries, and ER visits in previous 12 months J.  [place] such as hearing and vision if needed, cognitive and depression L. Referrals and appointments - none  In addition, I have reviewed and discussed with patient certain preventive protocols, quality metrics, and best practice recommendations. A written personalized care plan for preventive services as well as general preventive health recommendations were provided to patient.  See attached scanned questionnaire for additional information.   Signed,  Fabio Neighbors, LPN Nurse Health Advisor   Nurse  Recommendations: Pt declined the 5 year repeat DEXA scan.

## 2018-02-08 NOTE — Progress Notes (Signed)
Patient: Julia Salazar, Female    DOB: Mar 01, 1929, 84 y.o.   MRN: [id number] Visit Date: 02/08/2018  Today's Provider: Lelon Huh, MD   Chief Complaint  Patient presents with  . Annual Exam   Subjective:   Patient saw McKenzie today at 8:30 am for AWV.  Here today for follow up of multiple chronic problems. States she his having back and knee pain every day. Pain medications are relatively effective. She states that steroid injections have provided quite a bit of relief in the past.   -----------------------------------------------------------   Hypertension, follow-up:  BP Readings from Last 3 Encounters:  10/05/16 139/74  06/08/16 128/68  11/24/15 (!) 146/60    She was last seen for hypertension 4 months ago.  BP at that visit was 139/74. Management since that visit includes; changed to Candesartan to take the place of losartan.She reports good compliance with treatment. She is not having side effects. none She is not exercising. She is adherent to low salt diet.   Outside blood pressures are 120/60. She is experiencing fatigue.  Patient denies fatigue.   Cardiovascular risk factors include diabetes mellitus.  Use of agents associated with hypertension: none.   ----------------------------------------------------------------     Lipid/Cholesterol, Follow-up:   Last seen for this 4 months ago.  Management since that visit includes; labs checked, no changes.  Last Lipid Panel:    Component Value Date/Time   CHOL 188 10/05/2016 1440   TRIG 318 (H) 10/05/2016 1440   HDL 32 (L) 10/05/2016 1440   CHOLHDL 5.9 (H) 10/05/2016 1440   LDLCALC 92 10/05/2016 1440    She reports good compliance with treatment. She is not having side effects. none  Wt Readings from Last 3 Encounters:  10/05/16 157 lb (71.2 kg)  06/08/16 150 lb (68 kg)  11/24/15 150 lb (68 kg)    ----------------------------------------------------------------  She also has long  history of IBS with diarrhea which she states is getting worse. Has tried dycyclomine which didn't help. Has to take imodium just about every day. No blood in stool.   She also request prescription for occasional zolpidem to help sleep. She has taken in past and found it to be effective.   Review of Systems  Constitutional: Positive for fatigue.  HENT: Positive for hearing loss.   Eyes: Positive for pain and visual disturbance.  Respiratory: Negative.   Cardiovascular: Positive for leg swelling.  Gastrointestinal: Positive for diarrhea.  Endocrine: Negative.   Genitourinary: Negative.   Musculoskeletal: Positive for arthralgias, back pain and myalgias.  Skin: Negative.   Allergic/Immunologic: Negative.   Neurological: Negative.   Hematological: Bruises/bleeds easily.  Psychiatric/Behavioral: Negative.     Social History   Socioeconomic History  . Marital status: Married    Spouse name: Not on file  . Number of children: 3  . Years of education: Not on file  . Highest education level: Not on file  Social Needs  . Financial resource strain: Not on file  . Food insecurity - worry: Not on file  . Food insecurity - inability: Not on file  . Transportation needs - medical: Not on file  . Transportation needs - non-medical: Not on file  Occupational History  . Occupation: retired  Tobacco Use  . Smoking status: Never Smoker  . Smokeless tobacco: Never Used  Substance and Sexual Activity  . Alcohol use: No    Alcohol/week: 0.0 oz  . Drug use: No  . Sexual activity: Not on file  Other Topics Concern  . Not on file  Social History Narrative  . Not on file    Past Medical History:  Diagnosis Date  . Diverticulosis   . History of chicken pox   . History of measles   . History of mumps   . Ovarian cancer [facility]) 1995     Patient Active Problem List   Diagnosis Date Noted  . Diarrhea 10/05/2016  . Allergic rhinitis 11/24/2015  . Arthritis 11/24/2015  . Diverticulosis  of colon 11/24/2015  . Family history of breast cancer 11/24/2015  . Fatigue 11/24/2015  . Glaucoma 11/24/2015  . LBP (low back pain) 11/24/2015  . Arthritis of knee, degenerative 11/24/2015  . Edema 09/03/2009  . Insomnia 08/13/2009  . Vitamin D deficiency 03/29/2009  . Arthralgia of hip or thigh 03/27/2009  . Hyperlipidemia, mixed 03/17/2009  . Osteopenia 06/14/2006  . Colon polyp 07/09/2002  . Clinical depression 12/05/1998  . 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 Essential (primary) hypertension 12/05/1998  . Headache, migraine 12/05/1998  . History of ovarian cancer 12/05/1984  . H/O total hysterectomy 12/06/1971    Past Surgical History:  Procedure Laterality Date  . ABDOMINAL HYSTERECTOMY     1973  . APPENDECTOMY  1986  . BACK SURGERY  05/2012   done at [facility] in [place]  . BILATERAL SALPINGOOPHORECTOMY  1986  . CATARACT EXTRACTION  2010   right eye  . CHOLECYSTECTOMY  1998  . COLON RESECTION  1995    Her family history includes Breast cancer (age of onset: 6) in her sister; Colon cancer in her mother; Heart attack in her father; Leukemia in her sister.      Current Outpatient Medications:  .  amLODipine (NORVASC) 10 MG tablet, TAKE 1 TABLET DAILY, Disp: 90 tablet, Rfl: 4 .  aspirin 81 MG tablet, Take 1 tablet by mouth daily., Disp: , Rfl:  .  bimatoprost (LUMIGAN) 0.03 % ophthalmic solution, daily., Disp: , Rfl:  .  brimonidine (ALPHAGAN P) 0.1 % SOLN, Apply to eye daily., Disp: , Rfl:  .  Calcium Carb-Cholecalciferol (CALCIUM 600 + D) 600-200 MG-UNIT TABS, Take 1 tablet by mouth daily., Disp: , Rfl:  .  candesartan (ATACAND) 8 MG tablet, Take 1 tablet (8 mg total) by mouth daily., Disp: 90 tablet, Rfl: 3 .  carisoprodol (SOMA) 350 MG tablet, Take 1 tablet (350 mg total) by mouth every 6 (six) hours., Disp: 180 tablet, Rfl: 1 .  dicyclomine (BENTYL) 10 MG capsule, Take 1  capsule (10 mg total) by mouth 4 (four) times daily -  before meals and at bedtime. As needed for bowel movements, Disp: 180 capsule, Rfl: 4 .  diphenoxylate-atropine (LOMOTIL) 2.5-0.025 MG tablet, Take 1 tablet by mouth 4 (four) times daily as needed., Disp: 90 tablet, Rfl: 1 .  dorzolamide-timolol (COSOPT) 22.3-6.8 MG/ML ophthalmic solution, Place into both eyes 2 (two) times daily., Disp: , Rfl:  .  FLUoxetine (PROZAC) 20 MG capsule, Take 1 capsule (20 mg total) by mouth 2 (two) times daily., Disp: 90 capsule, Rfl: 4 .  furosemide (LASIX) 40 MG tablet, TAKE 1 TABLET DAILY, Disp: 90 tablet, Rfl: 4 .  hydrochlorothiazide (HYDRODIURIL) 12.5 MG tablet, TAKE 1 TABLET DAILY, Disp: 90 tablet, Rfl: 4 .  HYDROcodone-acetaminophen (NORCO) 7.5-325 MG tablet, Take 1 tablet by mouth 4 (four) times daily as needed for moderate pain., Disp: 360 tablet, Rfl: 0 .  HYDROcodone-acetaminophen (NORCO/VICODIN) 5-325 MG tablet,  Take 1 tablet by mouth every 6 (six) hours as needed for moderate pain., Disp: 360 tablet, Rfl: 0 .  meloxicam (MOBIC) 15 MG tablet, TAKE 1 TABLET DAILY, Disp: 90 tablet, Rfl: 5 .  Multiple Vitamins-Minerals (MULTIVITAMIN ADULT PO), Take 1 tablet by mouth daily., Disp: , Rfl:  .  omega-3 acid ethyl esters (LOVAZA) 1 g capsule, TAKE 4 CAPSULES DAILY, Disp: 360 capsule, Rfl: 4 .  potassium chloride SA (K-DUR,KLOR-CON) 20 MEQ tablet, TAKE 1 TABLET DAILY, Disp: 90 tablet, Rfl: 4 .  pravastatin (PRAVACHOL) 40 MG tablet, TAKE 1 TABLET DAILY, Disp: 90 tablet, Rfl: 4 .  Probiotic Product (DIGESTIVE ADVANTAGE GUMMIES PO), Take by mouth daily., Disp: , Rfl:   Patient Care Team: Birdie Sons, MD as PCP - General (Family Medicine) Oneta Rack, MD (Dermatology) System, Provider Not In (Orthopedic Surgery) Lorelee Cover., MD as Consulting Physician (Ophthalmology) Conception Chancy, DDS as Referring Physician (Dentistry)     Objective:   Vitals:  BP  124/64 (BP Location: Left Arm)     Pulse   64     Temp  97.7 F (36.5 C) (Oral)     Ht  4\' 11"  (1.499 m)     Wt  145 lb (65.8 kg)      BMI  29.29 kg/m       Physical Exam  General Appearance:    Alert, cooperative, no distress  HENT:   left TM normal without fluid or infection, right canal bstructed with cerumen neck without nodes and sinuses nontender  Eyes:    PERRL, conjunctiva/corneas clear, EOM's intact       Lungs:     Clear to auscultation bilaterally, respirations unlabored  Heart:    Regular rate and rhythm  Neurologic:   Awake, alert, oriented x 3. No apparent focal neurological           defect.         Activities of Daily Living No flowsheet data found.  Fall Risk Assessment Fall Risk  10/05/2016 11/24/2015  Falls in the past year? No Yes  Number falls in past yr: - 2 or more  Injury with Fall? - Yes     Depression Screen PHQ 2/9 Scores 10/05/2016 11/24/2015  PHQ - 2 Score 1 0  PHQ- 9 Score - 4      Assessment & Plan:    1. Essential (primary) hypertension Well controlled.  Continue current medications.    2. Hyperlipidemia, mixed She thought she was out of pravastatin, but mail order shows 90 day prescription was dispensed at the end of December. She is interested in getting of of Lovaza.  - Comprehensive metabolic panel - Lipid panel  3. Vitamin D deficiency  - VITAMIN D 25 Hydroxy (Vit-D Deficiency, Fractures)  4.  Chronic fatigue  - CBC - TSH  5. Osteoarthritis of left knee, unspecified osteoarthritis type Prepped knee with isopropyl alcohol and iodine. Injected 1cc 80mg /ml depot join from infero-medial approach. Patient tolerating well .   8. Insomnia, unspecified type  - zolpidem (AMBIEN) 5 MG tablet; Take 1 tablet (5 mg total) by mouth at bedtime as needed for sleep.  Dispense: 60 tablet; Refill: 1  9. Diarrhea, unspecified type Likely diarrhea predominate IBS. Failed dicyclomine. Try.  - rifaximin (XIFAXAN) 550 MG TABS tablet; Take 1 tablet (550 mg total) by  mouth 3 (three) times daily. Take for total 14 days if effecitve  Dispense: 12 tablet; Refill: 0   Lelon Huh, MD  [facility]

## 2018-02-09 ENCOUNTER — Telehealth: Payer: Self-pay | Admitting: *Deleted

## 2018-02-09 ENCOUNTER — Other Ambulatory Visit: Payer: Self-pay | Admitting: Family Medicine

## 2018-02-09 DIAGNOSIS — R197 Diarrhea, unspecified: Secondary | ICD-10-CM

## 2018-02-09 LAB — COMPREHENSIVE METABOLIC PANEL
A/G RATIO: 1.6 (ref 1.2–2.2)
ALK PHOS: 78 IU/L (ref 39–117)
ALT: 15 IU/L (ref 0–32)
AST: 18 IU/L (ref 0–40)
Albumin: 4.4 g/dL (ref 3.5–4.7)
BILIRUBIN TOTAL: 0.3 mg/dL (ref 0.0–1.2)
BUN/Creatinine Ratio: 20 (ref 12–28)
BUN: 23 mg/dL (ref 8–27)
CHLORIDE: 104 mmol/L (ref 96–106)
CO2: 22 mmol/L (ref 20–29)
Calcium: 9.8 mg/dL (ref 8.7–10.3)
Creatinine, Ser: 1.14 mg/dL — ABNORMAL HIGH (ref 0.57–1.00)
GFR calc Af Amer: 50 mL/min/{1.73_m2} — ABNORMAL LOW (ref >59)
GFR calc non Af Amer: 44 mL/min/{1.73_m2} — ABNORMAL LOW (ref >59)
Globulin, Total: 2.8 g/dL (ref 1.5–4.5)
Glucose: 93 mg/dL (ref 65–99)
POTASSIUM: 4.5 mmol/L (ref 3.5–5.2)
Sodium: 142 mmol/L (ref 134–144)
Total Protein: 7.2 g/dL (ref 6.0–8.5)

## 2018-02-09 LAB — LIPID PANEL
CHOLESTEROL TOTAL: 224 mg/dL — AB (ref 100–199)
Chol/HDL Ratio: 8.3 ratio — ABNORMAL HIGH (ref 0.0–4.4)
HDL: 27 mg/dL — AB (ref >39)
LDL CALC: 123 mg/dL — AB (ref 0–99)
Triglycerides: 370 mg/dL — ABNORMAL HIGH (ref 0–149)
VLDL CHOLESTEROL CAL: 74 mg/dL — AB (ref 5–40)

## 2018-02-09 LAB — CBC
Hematocrit: 36.6 % (ref 34.0–46.6)
Hemoglobin: 12.3 g/dL (ref 11.1–15.9)
MCH: 30.6 pg (ref 26.6–33.0)
MCHC: 33.6 g/dL (ref 31.5–35.7)
MCV: 91 fL (ref 79–97)
PLATELETS: 224 10*3/uL (ref 150–379)
RBC: 4.02 x10E6/uL (ref 3.77–5.28)
RDW: 13 % (ref 12.3–15.4)
WBC: 7.1 10*3/uL (ref 3.4–10.8)

## 2018-02-09 LAB — TSH: TSH: 6.91 u[IU]/mL — AB (ref 0.450–4.500)

## 2018-02-09 LAB — VITAMIN D 25 HYDROXY (VIT D DEFICIENCY, FRACTURES): Vit D, 25-Hydroxy: 44.9 ng/mL (ref 30.0–100.0)

## 2018-02-09 MED ORDER — LEVOTHYROXINE SODIUM 50 MCG PO TABS: 50.0000 ug | ORAL_TABLET | Freq: Every day | ORAL | 3 refills | Status: DC

## 2018-02-09 MED ORDER — DIPHENOXYLATE-ATROPINE 2.5-0.025 MG PO TABS: 1.0000 | ORAL_TABLET | Freq: Four times a day (QID) | ORAL | 1 refills | Status: DC | PRN

## 2018-02-09 MED ORDER — CARISOPRODOL 350 MG PO TABS: 350.0000 mg | ORAL_TABLET | Freq: Four times a day (QID) | ORAL | 1 refills | Status: DC

## 2018-02-09 NOTE — Telephone Encounter (Signed)
-----   Message from Birdie Sons, MD sent at 02/09/2018  7:46 AM EST ----- Cholesterol is high at 224. Please have double check and see if she is still taking pravastatin and let me know Is also hyPOthyroid which may contribute to fatigue. Can try levothyroxine 30mcg once a day, #30, rf x 3 and follow up in 3 months.

## 2018-02-09 NOTE — Telephone Encounter (Signed)
Patient was notified of results. Expressed understanding. Rx was sent to pharmacy. Patient stated she has not been taking the pravastatin. She found a whole bottle yesterday. Patient stated she started taking it again this morning.

## 2018-02-09 NOTE — Telephone Encounter (Addendum)
[facility] faxed a refill request for a 90-days supply for the following medications. Thanks CC  diphenoxylate-atropine (LOMOTIL) 2.5-0.025 MG tablet   carisoprodol (SOMA) 350 MG tablet

## 2018-02-12 ENCOUNTER — Telehealth: Payer: Self-pay | Admitting: Family Medicine

## 2018-02-12 DIAGNOSIS — R197 Diarrhea, unspecified: Secondary | ICD-10-CM

## 2018-02-12 MED ORDER — RIFAXIMIN 550 MG PO TABS: 550.0000 mg | ORAL_TABLET | Freq: Three times a day (TID) | ORAL | 2 refills | Status: DC

## 2018-02-12 NOTE — Telephone Encounter (Signed)
Pt is requesting Sharyn Lull return her call. Pt stated that she was supposed to call back this morning to speak with Sharyn Lull to follow up on medication that was discussed last week. Please advise. Thanks TNP

## 2018-02-12 NOTE — Telephone Encounter (Signed)
Patient called office to let Dr. Caryn Section know that the samples of rifaximin have worked really well for her and is requesting an rx be sent to [facility].

## 2018-02-13 ENCOUNTER — Ambulatory Visit (INDEPENDENT_AMBULATORY_CARE_PROVIDER_SITE_OTHER): Payer: Medicare Other | Admitting: Family Medicine

## 2018-02-13 ENCOUNTER — Encounter: Payer: Self-pay | Admitting: Family Medicine

## 2018-02-13 VITALS — BP 130/62 | HR 96 | Temp 97.7°F | Resp 16

## 2018-02-13 DIAGNOSIS — H6121 Impacted cerumen, right ear: Secondary | ICD-10-CM

## 2018-02-13 NOTE — Progress Notes (Signed)
Patient: Julia Salazar Female    DOB: July 07, 1929   84 y.o.   MRN: [id number] Visit Date: 02/13/2018  Today's Provider: Lelon Huh, MD   Chief Complaint  Patient presents with  . Cerumen Impaction   Subjective:    HPI  Patient is here to have for follow up right ear impaction from 02/08/2018. Has been OTC ear wax removal solution without clearing of cerumen.   Allergies  Allergen Reactions  . Lovastatin   . Sertraline Hcl   . Etodolac Rash     Current Outpatient Medications:  .  amLODipine (NORVASC) 10 MG tablet, TAKE 1 TABLET DAILY, Disp: 90 tablet, Rfl: 4 .  aspirin 81 MG tablet, Take 1 tablet by mouth daily., Disp: , Rfl:  .  bimatoprost (LUMIGAN) 0.03 % ophthalmic solution, daily., Disp: , Rfl:  .  brimonidine (ALPHAGAN P) 0.1 % SOLN, Apply to eye daily., Disp: , Rfl:  .  Calcium Carb-Cholecalciferol (CALCIUM 600 + D) 600-200 MG-UNIT TABS, Take 1 tablet by mouth daily., Disp: , Rfl:  .  candesartan (ATACAND) 8 MG tablet, Take 1 tablet (8 mg total) by mouth daily., Disp: 90 tablet, Rfl: 3 .  carisoprodol (SOMA) 350 MG tablet, Take 1 tablet (350 mg total) by mouth every 6 (six) hours., Disp: 180 tablet, Rfl: 1 .  diphenhydrAMINE (BENADRYL ALLERGY) 25 mg capsule, Take 25 mg by mouth at bedtime. , Disp: , Rfl:  .  diphenoxylate-atropine (LOMOTIL) 2.5-0.025 MG tablet, Take 1 tablet by mouth 4 (four) times daily as needed., Disp: 180 tablet, Rfl: 1 .  dorzolamide-timolol (COSOPT) 22.3-6.8 MG/ML ophthalmic solution, Place into both eyes 2 (two) times daily., Disp: , Rfl:  .  FLUoxetine (PROZAC) 20 MG capsule, Take 1 capsule (20 mg total) by mouth 2 (two) times daily. (Patient taking differently: Take 20 mg by mouth daily. ), Disp: 90 capsule, Rfl: 4 .  furosemide (LASIX) 40 MG tablet, TAKE 1 TABLET DAILY, Disp: 90 tablet, Rfl: 4 .  hydrochlorothiazide (HYDRODIURIL) 12.5 MG tablet, TAKE 1 TABLET DAILY, Disp: 90 tablet, Rfl: 4 .  HYDROcodone-acetaminophen (NORCO) 7.5-325 MG  tablet, Take 1 tablet by mouth 4 (four) times daily as needed for moderate pain., Disp: 360 tablet, Rfl: 0 .  HYDROcodone-acetaminophen (NORCO/VICODIN) 5-325 MG tablet, Take 1 tablet by mouth every 6 (six) hours as needed for moderate pain., Disp: 360 tablet, Rfl: 0 .  levothyroxine (SYNTHROID, LEVOTHROID) 50 MCG tablet, Take 1 tablet (50 mcg total) by mouth daily., Disp: 30 tablet, Rfl: 3 .  meloxicam (MOBIC) 15 MG tablet, TAKE 1 TABLET DAILY, Disp: 90 tablet, Rfl: 5 .  Multiple Vitamins-Minerals (MULTIVITAMIN ADULT PO), Take 1 tablet by mouth daily., Disp: , Rfl:  .  omega-3 acid ethyl esters (LOVAZA) 1 g capsule, TAKE 4 CAPSULES DAILY, Disp: 360 capsule, Rfl: 4 .  potassium chloride SA (K-DUR,KLOR-CON) 20 MEQ tablet, TAKE 1 TABLET DAILY, Disp: 90 tablet, Rfl: 4 .  pravastatin (PRAVACHOL) 40 MG tablet, TAKE 1 TABLET DAILY, Disp: 90 tablet, Rfl: 4 .  Probiotic Product (DIGESTIVE ADVANTAGE GUMMIES PO), Take by mouth daily., Disp: , Rfl:  .  rifaximin (XIFAXAN) 550 MG TABS tablet, Take 1 tablet (550 mg total) by mouth 3 (three) times daily. For 10 days as needed, Disp: 30 tablet, Rfl: 2 .  zolpidem (AMBIEN) 5 MG tablet, Take 1 tablet (5 mg total) by mouth at bedtime as needed for sleep., Disp: 60 tablet, Rfl: 1  Current Facility-Administered Medications:  .  methylPREDNISolone  acetate (DEPO-MEDROL) injection 80 mg, 80 mg, Intra-articular, Once, Samhitha Rosen, Kirstie Peri, MD  Review of Systems  Constitutional: Negative for appetite change, chills, fatigue and fever.  HENT:       Right ear stopped up  Respiratory: Negative for chest tightness and shortness of breath.   Cardiovascular: Negative for chest pain and palpitations.  Gastrointestinal: Negative for abdominal pain, nausea and vomiting.  Neurological: Negative for dizziness and weakness.    Social History   Tobacco Use  . Smoking status: Never Smoker  . Smokeless tobacco: Never Used  Substance Use Topics  . Alcohol use: No    Alcohol/week:  0.0 oz   Objective:   BP 130/62 (BP Location: Left Arm, Patient Position: Sitting, Cuff Size: Large)   Pulse 96   Temp 97.7 F (36.5 C) (Oral)   Resp 16      Assessment & Plan:     1. Impacted cerumen of right ear After soaking with Debrox, ear canal was irrigated with water until clear. Patient tolerated procedure well.  Reports significant improvement in hearing after procedure.        Lelon Huh, MD  [facility]

## 2018-02-18 ENCOUNTER — Other Ambulatory Visit: Payer: Self-pay | Admitting: Family Medicine

## 2018-03-09 ENCOUNTER — Other Ambulatory Visit: Payer: Self-pay

## 2018-03-09 NOTE — Telephone Encounter (Signed)
Patient called requesting refill to be sent to [facility]. Thanks!

## 2018-03-10 MED ORDER — HYDROCODONE-ACETAMINOPHEN 5-325 MG PO TABS: 1.0000 | ORAL_TABLET | Freq: Four times a day (QID) | ORAL | 0 refills | Status: DC | PRN

## 2018-03-19 ENCOUNTER — Other Ambulatory Visit: Payer: Self-pay | Admitting: Family Medicine

## 2018-04-09 ENCOUNTER — Other Ambulatory Visit: Payer: Self-pay | Admitting: Family Medicine

## 2018-04-09 DIAGNOSIS — M1712 Unilateral primary osteoarthritis, left knee: Secondary | ICD-10-CM

## 2018-04-09 NOTE — Telephone Encounter (Signed)
Pt contacted office for refill request on the following medications:  HYDROcodone-acetaminophen ([place]) 7.5-325 MG tablet   [facility]  Last Rx: 01/09/18 LOV: 02/13/18 Please advise. Thanks TNP

## 2018-04-10 MED ORDER — HYDROCODONE-ACETAMINOPHEN 7.5-325 MG PO TABS
1.0000 | ORAL_TABLET | Freq: Four times a day (QID) | ORAL | 0 refills | Status: DC | PRN
Start: 2018-04-10 — End: 2018-07-11

## 2018-04-25 ENCOUNTER — Other Ambulatory Visit: Payer: Self-pay | Admitting: Family Medicine

## 2018-04-25 DIAGNOSIS — R197 Diarrhea, unspecified: Secondary | ICD-10-CM

## 2018-05-14 ENCOUNTER — Ambulatory Visit (INDEPENDENT_AMBULATORY_CARE_PROVIDER_SITE_OTHER): Payer: Medicare Other | Admitting: Family Medicine

## 2018-05-14 ENCOUNTER — Encounter: Payer: Self-pay | Admitting: Family Medicine

## 2018-05-14 VITALS — BP 110/60 | HR 57 | Temp 98.3°F | Resp 16 | Ht 59.0 in | Wt 133.0 lb

## 2018-05-14 DIAGNOSIS — E039 Hypothyroidism, unspecified: Secondary | ICD-10-CM

## 2018-05-14 DIAGNOSIS — M2550 Pain in unspecified joint: Secondary | ICD-10-CM | POA: Diagnosis not present

## 2018-05-14 DIAGNOSIS — R269 Unspecified abnormalities of gait and mobility: Secondary | ICD-10-CM | POA: Diagnosis not present

## 2018-05-14 DIAGNOSIS — K529 Noninfective gastroenteritis and colitis, unspecified: Secondary | ICD-10-CM | POA: Diagnosis not present

## 2018-05-14 MED ORDER — DICLOFENAC SODIUM 1 % TD GEL: 4.0000 g | Freq: Four times a day (QID) | TRANSDERMAL | 3 refills | Status: DC

## 2018-05-14 MED ORDER — DICYCLOMINE HCL 10 MG PO CAPS: 10.0000 mg | ORAL_CAPSULE | Freq: Three times a day (TID) | ORAL | 1 refills | Status: DC

## 2018-05-14 NOTE — Progress Notes (Signed)
Patient: Julia Salazar Female    DOB: March 12, 1929   84 y.o.   MRN: [id number] Visit Date: 05/14/2018  Today's Provider: Lelon Huh, MD   Chief Complaint  Patient presents with  . Follow-up  . Hypertension  . Hyperlipidemia  . Hypothyroidism   Subjective:    HPI   Hypertension, follow-up:  BP Readings from Last 3 Encounters:  05/14/18 110/60  02/13/18 130/62  02/08/18 124/64    She was last seen for hypertension 3 months ago.  BP at that visit was 124/64. Management since that visit includes; no changes.She reports good compliance with treatment. She is not having side effects.  She is exercising. She is adherent to low salt diet.     -----------------------------------------------------------------     Hypothyroidism From 02/08/2018-labs checked. Started levothyroxine 50 mg qd.she is taking consistently and tolerating well without adverse effects. Her husband passed away recently . Wt Readings from Last 3 Encounters:  05/14/18 133 lb (60.3 kg)  02/08/18 145 lb (65.8 kg)  10/05/16 157 lb (71.2 kg)    Lab Results  Component Value Date   TSH 6.910 (H) 02/08/2018   She also is here to follow up of chronic diarrhea, she was previously on scheduled doses of dicyclomine, but had very successful response to Xifaxan in March. Has since stopped dicylomine and diarrhea has returned. Had another course of Xifaxan in May, but reports it did not help nearly as much as first episode.   She also reports she has been having more pain with left shoulder and and left knee the last few months. She is taking hydrocodone/apap daily which helps with her back, but is not helping knee and shoulder much. She previously saw Dr. Derrel Nip and prefers not to go to a new orthopedist.   She also reports she has been having trouble keeping her balance, no dizziness or spinning sensation, but just feels off balance when walking. She is dependent on a cane. Has not had and recent falls, but  several close calls. No focal leg weakness or numbness.   Allergies  Allergen Reactions  . Lovastatin   . Sertraline Hcl   . Etodolac Rash     Current Outpatient Medications:  .  amLODipine (NORVASC) 10 MG tablet, TAKE 1 TABLET DAILY, Disp: 90 tablet, Rfl: 4 .  aspirin 81 MG tablet, Take 1 tablet by mouth daily., Disp: , Rfl:  .  Calcium Carb-Cholecalciferol (CALCIUM 600 + D) 600-200 MG-UNIT TABS, Take 1 tablet by mouth daily., Disp: , Rfl:  .  candesartan (ATACAND) 8 MG tablet, Take 1 tablet (8 mg total) by mouth daily., Disp: 90 tablet, Rfl: 3 .  carisoprodol (SOMA) 350 MG tablet, Take 1 tablet (350 mg total) by mouth every 6 (six) hours., Disp: 180 tablet, Rfl: 1 .  diphenhydrAMINE (BENADRYL ALLERGY) 25 mg capsule, Take 25 mg by mouth at bedtime. , Disp: , Rfl:  .  diphenoxylate-atropine (LOMOTIL) 2.5-0.025 MG tablet, Take 1 tablet by mouth 4 (four) times daily as needed., Disp: 180 tablet, Rfl: 1 .  dorzolamide-timolol (COSOPT) 22.3-6.8 MG/ML ophthalmic solution, Place into both eyes 2 (two) times daily., Disp: , Rfl:  .  FLUoxetine (PROZAC) 20 MG capsule, Take 1 capsule (20 mg total) by mouth 2 (two) times daily. (Patient taking differently: Take 20 mg by mouth daily. ), Disp: 90 capsule, Rfl: 4 .  furosemide (LASIX) 40 MG tablet, TAKE 1 TABLET DAILY, Disp: 90 tablet, Rfl: 4 .  hydrochlorothiazide (HYDRODIURIL) 12.5  MG tablet, TAKE 1 TABLET DAILY, Disp: 90 tablet, Rfl: 4 .  HYDROcodone-acetaminophen (NORCO) 7.5-325 MG tablet, Take 1 tablet by mouth 4 (four) times daily as needed for moderate pain., Disp: 360 tablet, Rfl: 0 .  HYDROcodone-acetaminophen (NORCO/VICODIN) 5-325 MG tablet, Take 1 tablet by mouth every 6 (six) hours as needed for moderate pain., Disp: 360 tablet, Rfl: 0 .  levothyroxine (SYNTHROID, LEVOTHROID) 50 MCG tablet, Take 1 tablet (50 mcg total) by mouth daily., Disp: 30 tablet, Rfl: 3 .  meloxicam (MOBIC) 15 MG tablet, TAKE 1 TABLET DAILY, Disp: 90 tablet, Rfl: 5 .   Multiple Vitamins-Minerals (MULTIVITAMIN ADULT PO), Take 1 tablet by mouth daily., Disp: , Rfl:  .  omega-3 acid ethyl esters (LOVAZA) 1 g capsule, TAKE 4 CAPSULES DAILY, Disp: 360 capsule, Rfl: 4 .  potassium chloride SA (K-DUR,KLOR-CON) 20 MEQ tablet, TAKE 1 TABLET DAILY, Disp: 90 tablet, Rfl: 4 .  pravastatin (PRAVACHOL) 40 MG tablet, TAKE 1 TABLET DAILY, Disp: 90 tablet, Rfl: 4 .  XIFAXAN 550 MG TABS tablet, TAKE 1 TABLET (550 MG TOTAL) BY MOUTH 3 (THREE) TIMES DAILY. FOR 10 DAYS AS NEEDED, Disp: 30 tablet, Rfl: 5 .  bimatoprost (LUMIGAN) 0.03 % ophthalmic solution, daily., Disp: , Rfl:  .  brimonidine (ALPHAGAN P) 0.1 % SOLN, Apply to eye daily., Disp: , Rfl:  .  Probiotic Product (DIGESTIVE ADVANTAGE GUMMIES PO), Take by mouth daily., Disp: , Rfl:  .  zolpidem (AMBIEN) 5 MG tablet, Take 1 tablet (5 mg total) by mouth at bedtime as needed for sleep. (Patient not taking: Reported on 05/14/2018), Disp: 60 tablet, Rfl: 1  Current Facility-Administered Medications:  .  methylPREDNISolone acetate (DEPO-MEDROL) injection 80 mg, 80 mg, Intra-articular, Once, Fisher, Kirstie Peri, MD  Review of Systems  Constitutional: Negative for appetite change, chills, fatigue and fever.  Respiratory: Negative for chest tightness and shortness of breath.   Cardiovascular: Negative for chest pain and palpitations.  Gastrointestinal: Negative for abdominal pain, nausea and vomiting.  Neurological: Negative for dizziness and weakness.    Social History   Tobacco Use  . Smoking status: Never Smoker  . Smokeless tobacco: Never Used  Substance Use Topics  . Alcohol use: No    Alcohol/week: 0.0 oz   Objective:   BP 110/60 (BP Location: Right Arm, Patient Position: Sitting, Cuff Size: Normal)   Pulse (!) 57   Temp 98.3 F (36.8 C) (Oral)   Resp 16   Ht 4\' 11"  (1.499 m)   Wt 133 lb (60.3 kg)   SpO2 96%   BMI 26.86 kg/m  Vitals:   05/14/18 0850  BP: 110/60  Pulse: (!) 57  Resp: 16  Temp: 98.3 F (36.8  C)  TempSrc: Oral  SpO2: 96%  Weight: 133 lb (60.3 kg)  Height: 4\' 11"  (1.499 m)     Physical Exam  General appearance: alert, well developed, well nourished, cooperative and in no distress Head: Normocephalic, without obvious abnormality, atraumatic Respiratory: Respirations even and unlabored, normal respiratory rate HEENT: Ear canals clear.  Psych: Appropriate mood and affect. Neurologic: Mental status: Alert, oriented to person, place, and time, thought content appropriate. Unsteady gain, unable to walk without use of cane.      Assessment & Plan:     1. Hypothyroidism, unspecified type Tolerating initiation of low dose thyroid replacement.  - TSH  2. Chronic diarrhea Inadequate response to second course of Xifaxan. Start back on - dicyclomine (BENTYL) 10 MG capsule; Take 1 capsule (10 mg total) by  mouth 4 (four) times daily -  before meals and at bedtime. As needed for bowel movements  Dispense: 30 capsule; Refill: 1 Consider stool studies if symptoms persist.   3. Gait difficulty  - Ambulatory referral to [facility] for physical therapy.   4. Arthralgia, unspecified joint Already on 15mg  meloxicam and hydrocodone/apap. Recommended she see orthopedist. She prefers trial of topical- diclofenac sodium (VOLTAREN) 1 % GEL; Apply 4 g topically 4 (four) times daily.  Dispense: 100 g; Refill: 3  Call if not effective for orthopedic referral.       Lelon Huh, MD  [place]

## 2018-05-14 NOTE — Patient Instructions (Addendum)
   Call for referral to orthopedist if knee and shoulder are not feeling better after using diclofenac gel for a few weeks.

## 2018-05-15 LAB — TSH: TSH: 1.7 u[IU]/mL (ref 0.450–4.500)

## 2018-05-28 ENCOUNTER — Other Ambulatory Visit: Payer: Self-pay | Admitting: Family Medicine

## 2018-05-28 DIAGNOSIS — K529 Noninfective gastroenteritis and colitis, unspecified: Secondary | ICD-10-CM

## 2018-05-28 MED ORDER — DICYCLOMINE HCL 10 MG PO CAPS: 10.0000 mg | ORAL_CAPSULE | Freq: Three times a day (TID) | ORAL | 0 refills | Status: DC

## 2018-05-28 NOTE — Telephone Encounter (Signed)
Pt requesting refill of Dicylomine 10 MG sent to [facility] in [place]

## 2018-06-10 ENCOUNTER — Other Ambulatory Visit: Payer: Self-pay | Admitting: Family Medicine

## 2018-06-11 ENCOUNTER — Other Ambulatory Visit: Payer: Self-pay | Admitting: Family Medicine

## 2018-06-11 DIAGNOSIS — M1712 Unilateral primary osteoarthritis, left knee: Secondary | ICD-10-CM

## 2018-06-11 MED ORDER — HYDROCODONE-ACETAMINOPHEN 5-325 MG PO TABS: 1.0000 | ORAL_TABLET | Freq: Four times a day (QID) | ORAL | 0 refills | Status: DC | PRN

## 2018-06-11 NOTE — Telephone Encounter (Signed)
I called patient to verify dose. She states the dose she is requesting is for 5-325mg  tablets.

## 2018-06-11 NOTE — Telephone Encounter (Signed)
p needs a refill on her hydrocodone  [facility]  teri

## 2018-06-11 NOTE — Telephone Encounter (Signed)
Please verify dose, the 5 or the 7.5mg 

## 2018-06-12 ENCOUNTER — Other Ambulatory Visit: Payer: Self-pay | Admitting: Family Medicine

## 2018-06-12 DIAGNOSIS — K529 Noninfective gastroenteritis and colitis, unspecified: Secondary | ICD-10-CM

## 2018-06-12 MED ORDER — DICYCLOMINE HCL 10 MG PO CAPS: 10.0000 mg | ORAL_CAPSULE | Freq: Three times a day (TID) | ORAL | 5 refills | Status: DC

## 2018-06-12 NOTE — Telephone Encounter (Signed)
[facility] faxed a refill request for the following medication. Thanks CC  dicyclomine (BENTYL) 10 MG capsule

## 2018-06-12 NOTE — Telephone Encounter (Signed)
Please advise 

## 2018-06-14 ENCOUNTER — Other Ambulatory Visit: Payer: Self-pay | Admitting: Family Medicine

## 2018-06-14 DIAGNOSIS — Z1231 Encounter for screening mammogram for malignant neoplasm of breast: Secondary | ICD-10-CM

## 2018-06-14 NOTE — Telephone Encounter (Signed)
[facility] faxed a refill request for the following medication. Thanks CC  carisoprodol (SOMA) 350 MG tablet

## 2018-06-15 MED ORDER — CARISOPRODOL 350 MG PO TABS: 350.0000 mg | ORAL_TABLET | Freq: Four times a day (QID) | ORAL | 1 refills | Status: DC

## 2018-06-18 ENCOUNTER — Telehealth: Payer: Self-pay | Admitting: Family Medicine

## 2018-06-18 DIAGNOSIS — M1712 Unilateral primary osteoarthritis, left knee: Secondary | ICD-10-CM

## 2018-06-18 DIAGNOSIS — R269 Unspecified abnormalities of gait and mobility: Secondary | ICD-10-CM

## 2018-06-18 NOTE — Telephone Encounter (Signed)
Pt stated she was supposed to call back an speak with Dr. Maralyn Sago CMA to give an update on her knee. Pt stated that the diclofenac sodium (VOLTAREN) 1 % GEL hasn't helped and she really can't tell a difference in her knee pain. Pt stated they discussed ortho referral and she is ready to move forward with referral. Please advise. Thanks TNP

## 2018-06-18 NOTE — Telephone Encounter (Signed)
Patient advised. Please schedule referral. Patient would like to be seen by Dr. Sabra Heck if possible.

## 2018-06-18 NOTE — Telephone Encounter (Signed)
Have sent referral order, she should hear from sarah about it this week.

## 2018-06-18 NOTE — Telephone Encounter (Signed)
Please advise 

## 2018-06-24 ENCOUNTER — Other Ambulatory Visit: Payer: Self-pay | Admitting: Physician Assistant

## 2018-06-24 DIAGNOSIS — K529 Noninfective gastroenteritis and colitis, unspecified: Secondary | ICD-10-CM

## 2018-07-06 ENCOUNTER — Ambulatory Visit
Admission: RE | Admit: 2018-07-06 | Discharge: 2018-07-06 | Disposition: A | Discharge status: Home / Self Care | Payer: Medicare Other | Source: Ambulatory Visit | Attending: Family Medicine | Admitting: Family Medicine

## 2018-07-06 DIAGNOSIS — Z1231 Encounter for screening mammogram for malignant neoplasm of breast: Secondary | ICD-10-CM | POA: Diagnosis present

## 2018-07-09 DIAGNOSIS — Z947 Corneal transplant status: Secondary | ICD-10-CM | POA: Insufficient documentation

## 2018-07-11 ENCOUNTER — Other Ambulatory Visit: Payer: Self-pay

## 2018-07-11 DIAGNOSIS — M1712 Unilateral primary osteoarthritis, left knee: Secondary | ICD-10-CM

## 2018-07-11 MED ORDER — HYDROCODONE-ACETAMINOPHEN 7.5-325 MG PO TABS: 1.0000 | ORAL_TABLET | Freq: Four times a day (QID) | ORAL | 0 refills | Status: DC | PRN

## 2018-08-17 ENCOUNTER — Encounter: Payer: Self-pay | Admitting: Family Medicine

## 2018-08-30 HISTORY — PX: DESCEMETS STRIPPING AUTOMATED ENDOTHELIAL KERATOPLASTY: SHX1454

## 2018-09-07 ENCOUNTER — Other Ambulatory Visit: Payer: Self-pay | Admitting: Family Medicine

## 2018-09-07 MED ORDER — HYDROCODONE-ACETAMINOPHEN 5-325 MG PO TABS: 1.0000 | ORAL_TABLET | Freq: Four times a day (QID) | ORAL | 0 refills | Status: DC | PRN

## 2018-09-07 NOTE — Telephone Encounter (Signed)
Pt needs refill hydrocodone 5-325  [facility]  CB#  [phone]  Thanks Con Memos

## 2018-09-25 ENCOUNTER — Other Ambulatory Visit: Payer: Self-pay | Admitting: Family Medicine

## 2018-09-25 MED ORDER — CARISOPRODOL 350 MG PO TABS: 350.0000 mg | ORAL_TABLET | Freq: Four times a day (QID) | ORAL | 4 refills | Status: DC

## 2018-09-25 NOTE — Telephone Encounter (Signed)
Patient was advised.  

## 2018-09-25 NOTE — Telephone Encounter (Signed)
[facility] faxed refill request for the following medications:  carisoprodol (SOMA) 350 MG tablet  Qty: 180  Please advise.

## 2018-09-27 ENCOUNTER — Ambulatory Visit (INDEPENDENT_AMBULATORY_CARE_PROVIDER_SITE_OTHER): Payer: Medicare Other

## 2018-09-27 DIAGNOSIS — Z23 Encounter for immunization: Secondary | ICD-10-CM | POA: Diagnosis not present

## 2018-10-08 ENCOUNTER — Other Ambulatory Visit: Payer: Self-pay | Admitting: Family Medicine

## 2018-10-08 DIAGNOSIS — M1712 Unilateral primary osteoarthritis, left knee: Secondary | ICD-10-CM

## 2018-10-08 MED ORDER — HYDROCODONE-ACETAMINOPHEN 7.5-325 MG PO TABS: 1.0000 | ORAL_TABLET | Freq: Four times a day (QID) | ORAL | 0 refills | Status: DC | PRN

## 2018-10-08 NOTE — Telephone Encounter (Signed)
Pt needing a refill:  HYDROcodone-acetaminophen ([place]) 7.5-325 MG tablet  Please fill at:  [place], [place] - [address] [phone] (Phone) [phone] (Fax)    Thanks, [place]

## 2018-12-03 ENCOUNTER — Other Ambulatory Visit: Payer: Self-pay | Admitting: Family Medicine

## 2018-12-07 ENCOUNTER — Other Ambulatory Visit: Payer: Self-pay

## 2018-12-07 MED ORDER — HYDROCODONE-ACETAMINOPHEN 5-325 MG PO TABS: 1.0000 | ORAL_TABLET | Freq: Four times a day (QID) | ORAL | 0 refills | Status: DC | PRN

## 2018-12-22 ENCOUNTER — Other Ambulatory Visit: Payer: Self-pay | Admitting: Family Medicine

## 2019-01-02 ENCOUNTER — Other Ambulatory Visit: Payer: Self-pay

## 2019-01-02 DIAGNOSIS — M1712 Unilateral primary osteoarthritis, left knee: Secondary | ICD-10-CM

## 2019-01-02 MED ORDER — HYDROCODONE-ACETAMINOPHEN 7.5-325 MG PO TABS: 1.0000 | ORAL_TABLET | Freq: Four times a day (QID) | ORAL | 0 refills | Status: DC | PRN

## 2019-02-12 ENCOUNTER — Ambulatory Visit (INDEPENDENT_AMBULATORY_CARE_PROVIDER_SITE_OTHER): Payer: Medicare Other

## 2019-02-12 VITALS — BP 134/68 | HR 65 | Temp 98.0°F | Ht 59.0 in | Wt 127.6 lb

## 2019-02-12 DIAGNOSIS — Z0000 Encounter for general adult medical examination without abnormal findings: Secondary | ICD-10-CM

## 2019-02-12 NOTE — Patient Instructions (Signed)
Julia Salazar , Thank you for taking time to come for your Medicare Wellness Visit. I appreciate your ongoing commitment to your health goals. Please review the following plan we discussed and let me know if I can assist you in the future.   Screening recommendations/referrals: Colonoscopy: No longer required.  Mammogram: No longer required.  Bone Density: Pt declines order today.  Recommended yearly ophthalmology/optometry visit for glaucoma screening and checkup Recommended yearly dental visit for hygiene and checkup  Vaccinations: Influenza vaccine: Up to date Pneumococcal vaccine: Completed series Tdap vaccine: Up to date, due 10/2024 Shingles vaccine: Pt declines today.     Advanced directives: Currently on file.   Conditions/risks identified: Recommend to exercise or stretch for 3 days a week for at least 30 minutes at a time.   Next appointment: 02/21/19 @ 9:20 AM. Declined scheduling CPE at this time for 2020.   Preventive Care 83 Years and Older, Female Preventive care refers to lifestyle choices and visits with your health care provider that can promote health and wellness. What does preventive care include?  A yearly physical exam. This is also called an annual well check.  Dental exams once or twice a year.  Routine eye exams. Ask your health care provider how often you should have your eyes checked.  Personal lifestyle choices, including:  Daily care of your teeth and gums.  Regular physical activity.  Eating a healthy diet.  Avoiding tobacco and drug use.  Limiting alcohol use.  Practicing safe sex.  Taking low-dose aspirin every day.  Taking vitamin and mineral supplements as recommended by your health care provider. What happens during an annual well check? The services and screenings done by your health care provider during your annual well check will depend on your age, overall health, lifestyle risk factors, and family history of disease. Counseling    Your health care provider may ask you questions about your:  Alcohol use.  Tobacco use.  Drug use.  Emotional well-being.  Home and relationship well-being.  Sexual activity.  Eating habits.  History of falls.  Memory and ability to understand (cognition).  Work and work Statistician.  Reproductive health. Screening  You may have the following tests or measurements:  Height, weight, and BMI.  Blood pressure.  Lipid and cholesterol levels. These may be checked every 5 years, or more frequently if you are over 59 years old.  Skin check.  Lung cancer screening. You may have this screening every year starting at age 42 if you have a 30-pack-year history of smoking and currently smoke or have quit within the past 15 years.  Fecal occult blood test (FOBT) of the stool. You may have this test every year starting at age 51.  Flexible sigmoidoscopy or colonoscopy. You may have a sigmoidoscopy every 5 years or a colonoscopy every 10 years starting at age 80.  Hepatitis C blood test.  Hepatitis B blood test.  Sexually transmitted disease (STD) testing.  Diabetes screening. This is done by checking your blood sugar (glucose) after you have not eaten for a while (fasting). You may have this done every 1-3 years.  Bone density scan. This is done to screen for osteoporosis. You may have this done starting at age 27.  Mammogram. This may be done every 1-2 years. Talk to your health care provider about how often you should have regular mammograms. Talk with your health care provider about your test results, treatment options, and if necessary, the need for more tests. Vaccines  Your health care provider may recommend certain vaccines, such as:  Influenza vaccine. This is recommended every year.  Tetanus, diphtheria, and acellular pertussis (Tdap, Td) vaccine. You may need a Td booster every 10 years.  Zoster vaccine. You may need this after age 17.  Pneumococcal 13-valent  conjugate (PCV13) vaccine. One dose is recommended after age 14.  Pneumococcal polysaccharide (PPSV23) vaccine. One dose is recommended after age 55. Talk to your health care provider about which screenings and vaccines you need and how often you need them. This information is not intended to replace advice given to you by your health care provider. Make sure you discuss any questions you have with your health care provider. Document Released: 12/18/2015 Document Revised: 08/10/2016 Document Reviewed: 09/22/2015 Elsevier Interactive Patient Education  2017 [place] Prevention in the Home Falls can cause injuries. They can happen to people of all ages. There are many things you can do to make your home safe and to help prevent falls. What can I do on the outside of my home?  Regularly fix the edges of walkways and driveways and fix any cracks.  Remove anything that might make you trip as you walk through a door, such as a raised step or threshold.  Trim any bushes or trees on the path to your home.  Use bright outdoor lighting.  Clear any walking paths of anything that might make someone trip, such as rocks or tools.  Regularly check to see if handrails are loose or broken. Make sure that both sides of any steps have handrails.  Any raised decks and porches should have guardrails on the edges.  Have any leaves, snow, or ice cleared regularly.  Use sand or salt on walking paths during winter.  Clean up any spills in your garage right away. This includes oil or grease spills. What can I do in the bathroom?  Use night lights.  Install grab bars by the toilet and in the tub and shower. Do not use towel bars as grab bars.  Use non-skid mats or decals in the tub or shower.  If you need to sit down in the shower, use a plastic, non-slip stool.  Keep the floor dry. Clean up any water that spills on the floor as soon as it happens.  Remove soap buildup in the tub or  shower regularly.  Attach bath mats securely with double-sided non-slip rug tape.  Do not have throw rugs and other things on the floor that can make you trip. What can I do in the bedroom?  Use night lights.  Make sure that you have a light by your bed that is easy to reach.  Do not use any sheets or blankets that are too big for your bed. They should not hang down onto the floor.  Have a firm chair that has side arms. You can use this for support while you get dressed.  Do not have throw rugs and other things on the floor that can make you trip. What can I do in the kitchen?  Clean up any spills right away.  Avoid walking on wet floors.  Keep items that you use a lot in easy-to-reach places.  If you need to reach something above you, use a strong step stool that has a grab bar.  Keep electrical cords out of the way.  Do not use floor polish or wax that makes floors slippery. If you must use wax, use non-skid floor wax.  Do  not have throw rugs and other things on the floor that can make you trip. What can I do with my stairs?  Do not leave any items on the stairs.  Make sure that there are handrails on both sides of the stairs and use them. Fix handrails that are broken or loose. Make sure that handrails are as long as the stairways.  Check any carpeting to make sure that it is firmly attached to the stairs. Fix any carpet that is loose or worn.  Avoid having throw rugs at the top or bottom of the stairs. If you do have throw rugs, attach them to the floor with carpet tape.  Make sure that you have a light switch at the top of the stairs and the bottom of the stairs. If you do not have them, ask someone to add them for you. What else can I do to help prevent falls?  Wear shoes that:  Do not have high heels.  Have rubber bottoms.  Are comfortable and fit you well.  Are closed at the toe. Do not wear sandals.  If you use a stepladder:  Make sure that it is fully  opened. Do not climb a closed stepladder.  Make sure that both sides of the stepladder are locked into place.  Ask someone to hold it for you, if possible.  Clearly mark and make sure that you can see:  Any grab bars or handrails.  First and last steps.  Where the edge of each step is.  Use tools that help you move around (mobility aids) if they are needed. These include:  Canes.  Walkers.  Scooters.  Crutches.  Turn on the lights when you go into a dark area. Replace any light bulbs as soon as they burn out.  Set up your furniture so you have a clear path. Avoid moving your furniture around.  If any of your floors are uneven, fix them.  If there are any pets around you, be aware of where they are.  Review your medicines with your doctor. Some medicines can make you feel dizzy. This can increase your chance of falling. Ask your doctor what other things that you can do to help prevent falls. This information is not intended to replace advice given to you by your health care provider. Make sure you discuss any questions you have with your health care provider. Document Released: 09/17/2009 Document Revised: 04/28/2016 Document Reviewed: 12/26/2014 Elsevier Interactive Patient Education  2017 [employer].

## 2019-02-12 NOTE — Progress Notes (Signed)
Subjective:   Julia Salazar is a 85 y.o. female who presents for Medicare Annual (Subsequent) preventive examination.  Review of Systems:  N/A  Cardiac Risk Factors include: advanced age (>30men, >40 women);dyslipidemia;hypertension     Objective:     Vitals: BP 134/68 (BP Location: Right Arm)   Pulse 65   Temp 98 F (36.7 C) (Oral)   Ht 4\' 11"  (1.499 m)   Wt 127 lb 9.6 oz (57.9 kg)   BMI 25.77 kg/m   Body mass index is 25.77 kg/m.  Advanced Directives 02/12/2019 02/08/2018 10/05/2016  Does Patient Have a Medical Advance Directive? Yes Yes Yes  Type of Paramedic of [place];Living will [place];Living will;Out of facility DNR (pink MOST or yellow form) [place];Living will  Copy of [place] in Chart? Yes - validated most recent copy scanned in chart (See row information) Yes Yes    Tobacco Social History   Tobacco Use  Smoking Status Never Smoker  Smokeless Tobacco Never Used     Counseling given: Not Answered   Clinical Intake:  Pre-visit preparation completed: Yes  Pain : No/denies pain Pain Score: 0-No pain(Has left knee pain with movement. )     Nutritional Status: BMI 25 -29 Overweight Nutritional Risks: Nausea/ vomitting/ diarrhea(Diarrhea occasionally due to IBS.)     Interpreter Needed?: No  Information entered by :: [facility], LPN  Past Medical History:  Diagnosis Date  . Depression   . Diverticulosis   . History of chicken pox   . History of measles   . History of mumps   . Hyperlipidemia   . Hypertension   . Ovarian cancer ([place]) 1995   Past Surgical History:  Procedure Laterality Date  . ABDOMINAL HYSTERECTOMY     1973  . APPENDECTOMY  1986  . BACK SURGERY  05/2012   done at [facility] in [place]  . BILATERAL SALPINGOOPHORECTOMY  1986  . CATARACT EXTRACTION  2010   right eye  . CHOLECYSTECTOMY  1998  . COLON RESECTION  1995  . CORNEAL  TRANSPLANT    . OOPHORECTOMY     Family History  Problem Relation Age of Onset  . Colon cancer Mother   . Heart attack Father   . Breast cancer Sister 53  . Leukemia Sister   . Breast cancer Sister    Social History   Socioeconomic History  . Marital status: Widowed    Spouse name: Not on file  . Number of children: 3  . Years of education: LPN  . Highest education level: Associate degree: occupational, Hotel manager, or vocational program  Occupational History  . Occupation: retired  Scientific laboratory technician  . Financial resource strain: Not hard at all  . Food insecurity:    Worry: Never true    Inability: Never true  . Transportation needs:    Medical: No    Non-medical: No  Tobacco Use  . Smoking status: Never Smoker  . Smokeless tobacco: Never Used  Substance and Sexual Activity  . Alcohol use: No    Alcohol/week: 0.0 standard drinks  . Drug use: No  . Sexual activity: Not on file  Lifestyle  . Physical activity:    Days per week: 0 days    Minutes per session: 0 min  . Stress: To some extent  Relationships  . Social connections:    Talks on phone: Patient refused    Gets together: Patient refused  Attends religious service: Patient refused    Active member of club or organization: Patient refused    Attends meetings of clubs or organizations: Patient refused    Relationship status: Patient refused  Other Topics Concern  . Not on file  Social History Narrative  . Not on file    Outpatient Encounter Medications as of 02/12/2019  Medication Sig  . amLODipine (NORVASC) 10 MG tablet TAKE 1 TABLET DAILY  . aspirin 81 MG tablet Take 1 tablet by mouth daily.  . brimonidine-timolol (COMBIGAN) 0.2-0.5 % ophthalmic solution Place 1 drop into both eyes every 12 (twelve) hours.   . Calcium Carb-Cholecalciferol (CALCIUM 600 + D) 600-200 MG-UNIT TABS Take 1 tablet by mouth daily.  . candesartan (ATACAND) 8 MG tablet TAKE 1 TABLET DAILY (REPLACES LOSARTAN)  . carisoprodol (SOMA)  350 MG tablet Take 1 tablet (350 mg total) by mouth every 6 (six) hours.  . diclofenac sodium (VOLTAREN) 1 % GEL Apply 4 g topically 4 (four) times daily.  . diphenhydrAMINE (BENADRYL ALLERGY) 25 mg capsule Take 25 mg by mouth at bedtime.   . diphenoxylate-atropine (LOMOTIL) 2.5-0.025 MG tablet Take 1 tablet by mouth 4 (four) times daily as needed.  Marland Kitchen FLUoxetine (PROZAC) 20 MG capsule Take 1 capsule (20 mg total) by mouth daily.  . furosemide (LASIX) 40 MG tablet TAKE 1 TABLET DAILY  . hydrochlorothiazide (HYDRODIURIL) 12.5 MG tablet TAKE 1 TABLET DAILY  . HYDROcodone-acetaminophen (NORCO) 7.5-325 MG tablet Take 1 tablet by mouth 4 (four) times daily as needed for moderate pain.  Marland Kitchen HYDROcodone-acetaminophen (NORCO/VICODIN) 5-325 MG tablet Take 1 tablet by mouth every 6 (six) hours as needed for moderate pain.  . meloxicam (MOBIC) 15 MG tablet Take 1 tablet (15 mg total) by mouth daily as needed.  . Multiple Vitamins-Minerals (MULTIVITAMIN ADULT PO) Take 1 tablet by mouth daily.  Marland Kitchen omega-3 acid ethyl esters (LOVAZA) 1 g capsule TAKE 4 CAPSULES DAILY  . potassium chloride SA (K-DUR,KLOR-CON) 20 MEQ tablet TAKE 1 TABLET DAILY  . pravastatin (PRAVACHOL) 40 MG tablet TAKE 1 TABLET DAILY  . prednisoLONE acetate (PRED FORTE) 1 % ophthalmic suspension Place 1 drop into the left eye daily.   . bimatoprost (LUMIGAN) 0.03 % ophthalmic solution daily.  . brimonidine (ALPHAGAN P) 0.1 % SOLN Apply to eye daily.  Marland Kitchen dicyclomine (BENTYL) 10 MG capsule Take 1 capsule (10 mg total) by mouth 4 (four) times daily -  before meals and at bedtime. As needed for bowel movements (Patient not taking: Reported on 02/12/2019)  . dicyclomine (BENTYL) 10 MG capsule TAKE 1 CAP BY MOUTH 4 TIMES DAILY BEFORE MEALS AND AT BEDTIME FOR BOWEL MOVEMENTS (Patient not taking: Reported on 02/12/2019)  . dorzolamide-timolol (COSOPT) 22.3-6.8 MG/ML ophthalmic solution Place into both eyes 2 (two) times daily.  Marland Kitchen levothyroxine (SYNTHROID,  LEVOTHROID) 50 MCG tablet TAKE 1 TABLET BY MOUTH EVERY DAY (Patient not taking: Reported on 02/12/2019)  . Probiotic Product (DIGESTIVE ADVANTAGE GUMMIES PO) Take by mouth daily.  Marland Kitchen XIFAXAN 550 MG TABS tablet TAKE 1 TABLET (550 MG TOTAL) BY MOUTH 3 (THREE) TIMES DAILY. FOR 10 DAYS AS NEEDED (Patient not taking: Reported on 02/12/2019)  . zolpidem (AMBIEN) 5 MG tablet Take 1 tablet (5 mg total) by mouth at bedtime as needed for sleep. (Patient not taking: Reported on 05/14/2018)   Facility-Administered Encounter Medications as of 02/12/2019  Medication  . methylPREDNISolone acetate (DEPO-MEDROL) injection 80 mg    Activities of Daily Living In your present state of health, do  you have any difficulty performing the following activities: 02/12/2019  Hearing? N  Vision? Y  Comment Due to glaucoma and recent cornea transplant.  Difficulty concentrating or making decisions? N  Walking or climbing stairs? Y  Comment Due to right knee pain.   Dressing or bathing? N  Doing errands, shopping? Y  Comment Does not drive currently.   Preparing Food and eating ? N  Using the Toilet? N  In the past six months, have you accidently leaked urine? Y  Comment Wears depends daily.   Do you have problems with loss of bowel control? Y  Comment Wears depends daily.   Managing your Medications? N  Managing your Finances? Y  Comment Daughter assists with finances.  Housekeeping or managing your Housekeeping? Y  Comment Does minimal cleaning. Has a Chartered certified accountant.   Some recent data might be hidden    Patient Care Team: Birdie Sons, MD as PCP - General (Family Medicine) Oneta Rack, MD (Dermatology) Lorelee Cover., MD as Consulting Physician (Ophthalmology) Conception Chancy, DDS as Referring Physician (Dentistry) Vevelyn Royals, MD as Consulting Physician (Ophthalmology) Skaggs, Alric Quan, MD (Ophthalmology)    Assessment:   This is a routine wellness examination for [place].  Exercise  Activities and Dietary recommendations Current Exercise Habits: The patient does not participate in regular exercise at present, Exercise limited by: orthopedic condition(s)  Goals    . DIET - DECREASE FRIED FOODS     Recommend to cut out all foods that are fried in diet.     . Exercise 3x per week (30 min per time)     Recommend to exercise or stretch for 3 days a week for at least 30 minutes at a time.     . Increase water intake     Starting 10/05/16, I will increase my water intake to 4 glasses a day.       Fall Risk Fall Risk  02/12/2019 02/08/2018 10/05/2016 11/24/2015  Falls in the past year? 0 No No Yes  Number falls in past yr: - - - 2 or more  Injury with Fall? - - - Yes   FALL RISK PREVENTION PERTAINING TO THE HOME: Any stairs in or around the home? Yes  If so, do they handrails? Yes   Home free of loose throw rugs in walkways, pet beds, electrical cords, etc? Yes  Adequate lighting in your home to reduce risk of falls? Yes   ASSISTIVE DEVICES UTILIZED TO PREVENT FALLS:  Life alert? Yes  Use of a cane, walker or w/c? Yes  Grab bars in the bathroom? Yes  Shower chair or bench in shower? Yes  Elevated toilet seat or a handicapped toilet? No    TIMED UP AND GO:  Was the test performed? No .    Depression Screen PHQ 2/9 Scores 02/12/2019 02/12/2019 02/08/2018 02/08/2018  PHQ - 2 Score 0 0 0 0  PHQ- 9 Score 1 - 1 -     Cognitive Function: Declined today.      6CIT Screen 02/08/2018 10/05/2016  What Year? 0 points 0 points  What month? 0 points 0 points  What time? 0 points 0 points  Count back from 20 0 points 0 points  Months in reverse 0 points 0 points  Repeat phrase 2 points 2 points  Total Score 2 2    Immunization History  Administered Date(s) Administered  . Influenza Split 08/09/2011  . Influenza, High Dose Seasonal PF 08/24/2015, 09/21/2016, 09/27/2018  .  Influenza-Unspecified 09/25/2017  . Pneumococcal Conjugate-13 11/03/2014  . Pneumococcal  Polysaccharide-23 10/17/2012  . Td 11/03/2014  . Zoster 11/07/2013    Qualifies for Shingles Vaccine? Yes  Zostavax completed 11/07/13. Due for Shingrix. Education has been provided regarding the importance of this vaccine. Pt has been advised to call insurance company to determine out of pocket expense. Advised may also receive vaccine at local pharmacy or Health Dept. Verbalized acceptance and understanding.  Tdap: Up to date  Flu Vaccine: Up to date  Pneumococcal Vaccine: Up to date  Screening Tests Health Maintenance  Topic Date Due  . DEXA SCAN  10/24/2017  . TETANUS/TDAP  11/03/2024  . INFLUENZA VACCINE  Completed  . PNA vac Low Risk Adult  Completed    Cancer Screenings:  Colorectal Screening: No longer required.   Mammogram: No longer required.   Bone Density: Completed 10/24/12. Results reflect OSTEOPENIA. Repeat every 5 years. Pt declined order today.   Lung Cancer Screening: (Low Dose CT Chest recommended if Age 58-80 years, 30 pack-year currently smoking OR have quit w/in 15years.) does not qualify.    Additional Screening:  Vision Screening: Recommended annual ophthalmology exams for early detection of glaucoma and other disorders of the eye.  Dental Screening: Recommended annual dental exams for proper oral hygiene  Community Resource Referral:  CRR required this visit?  No       Plan:  I have personally reviewed and addressed the Medicare Annual Wellness questionnaire and have noted the following in the patient's chart:  A. Medical and social history B. Use of alcohol, tobacco or illicit drugs  C. Current medications and supplements D. Functional ability and status E.  Nutritional status F.  Physical activity G. Advance directives H. List of other physicians I.  Hospitalizations, surgeries, and ER visits in previous 12 months J.  [place] such as hearing and vision if needed, cognitive and depression L. Referrals and appointments -  none  In addition, I have reviewed and discussed with patient certain preventive protocols, quality metrics, and best practice recommendations. A written personalized care plan for preventive services as well as general preventive health recommendations were provided to patient.  See attached scanned questionnaire for additional information.   Signed,  Fabio Neighbors, LPN Nurse Health Advisor   Nurse Recommendations: Pt declined DEXA scan order.

## 2019-02-15 ENCOUNTER — Other Ambulatory Visit: Payer: Self-pay | Admitting: Family Medicine

## 2019-03-06 ENCOUNTER — Other Ambulatory Visit: Payer: Self-pay

## 2019-03-06 MED ORDER — HYDROCODONE-ACETAMINOPHEN 5-325 MG PO TABS: 1.0000 | ORAL_TABLET | Freq: Four times a day (QID) | ORAL | 0 refills | Status: DC | PRN

## 2019-03-06 NOTE — Telephone Encounter (Signed)
[facility]

## 2019-03-08 ENCOUNTER — Other Ambulatory Visit: Payer: Self-pay | Admitting: Family Medicine

## 2019-03-27 ENCOUNTER — Other Ambulatory Visit: Payer: Self-pay | Admitting: Family Medicine

## 2019-04-03 ENCOUNTER — Other Ambulatory Visit: Payer: Self-pay | Admitting: Family Medicine

## 2019-04-03 DIAGNOSIS — M1712 Unilateral primary osteoarthritis, left knee: Secondary | ICD-10-CM

## 2019-04-03 NOTE — Telephone Encounter (Signed)
Pt called asking for a refill on  Hydrocodone 7.5-325  [facility]  Thanks teri

## 2019-04-04 MED ORDER — HYDROCODONE-ACETAMINOPHEN 7.5-325 MG PO TABS: 1.0000 | ORAL_TABLET | Freq: Four times a day (QID) | ORAL | 0 refills | Status: DC | PRN

## 2019-05-16 ENCOUNTER — Other Ambulatory Visit: Payer: Self-pay | Admitting: Family Medicine

## 2019-06-10 ENCOUNTER — Other Ambulatory Visit: Payer: Self-pay | Admitting: Family Medicine

## 2019-06-10 MED ORDER — HYDROCODONE-ACETAMINOPHEN 5-325 MG PO TABS: 1.0000 | ORAL_TABLET | Freq: Four times a day (QID) | ORAL | 0 refills | Status: DC | PRN

## 2019-06-10 NOTE — Telephone Encounter (Signed)
Pt needs a refill on her Hydrocodone  5-325  [facility]  CB# [phone]

## 2019-06-12 ENCOUNTER — Other Ambulatory Visit: Payer: Self-pay | Admitting: Family Medicine

## 2019-07-04 ENCOUNTER — Other Ambulatory Visit: Payer: Self-pay | Admitting: Family Medicine

## 2019-07-04 DIAGNOSIS — M1712 Unilateral primary osteoarthritis, left knee: Secondary | ICD-10-CM

## 2019-07-04 MED ORDER — HYDROCODONE-ACETAMINOPHEN 7.5-325 MG PO TABS: 1.0000 | ORAL_TABLET | Freq: Four times a day (QID) | ORAL | 0 refills | Status: DC | PRN

## 2019-07-04 NOTE — Telephone Encounter (Signed)
pt needs refill on hydrocodone 7.5 /325  [facility]  Julia Salazar

## 2019-07-23 ENCOUNTER — Other Ambulatory Visit: Payer: Self-pay | Admitting: Family Medicine

## 2019-07-23 DIAGNOSIS — R197 Diarrhea, unspecified: Secondary | ICD-10-CM

## 2019-07-23 MED ORDER — DIPHENOXYLATE-ATROPINE 2.5-0.025 MG PO TABS: 1.0000 | ORAL_TABLET | Freq: Four times a day (QID) | ORAL | 1 refills | Status: DC | PRN

## 2019-07-23 NOTE — Telephone Encounter (Signed)
Pt needing a refill on: diphenoxylate-atropine (LOMOTIL) 2.5-0.025 MG tablet   Please fill at:  [id number] - [place], [place] - [address] [phone] (Phone) [phone] (Fax)   Thanks, [employer]

## 2019-08-16 ENCOUNTER — Telehealth: Payer: Self-pay

## 2019-08-16 DIAGNOSIS — Z1239 Encounter for other screening for malignant neoplasm of breast: Secondary | ICD-10-CM

## 2019-08-16 NOTE — Telephone Encounter (Signed)
Ok to order 

## 2019-08-16 NOTE — Telephone Encounter (Signed)
Patient has been advised. KW 

## 2019-08-16 NOTE — Telephone Encounter (Signed)
Patient is requesting an order for a mammogram.

## 2019-08-16 NOTE — Telephone Encounter (Signed)
Have entered order, she just needs to call to schedule

## 2019-08-19 ENCOUNTER — Other Ambulatory Visit: Payer: Self-pay | Admitting: Family Medicine

## 2019-08-19 DIAGNOSIS — Z1231 Encounter for screening mammogram for malignant neoplasm of breast: Secondary | ICD-10-CM

## 2019-08-20 ENCOUNTER — Other Ambulatory Visit: Payer: Self-pay | Admitting: Family Medicine

## 2019-08-21 ENCOUNTER — Ambulatory Visit (INDEPENDENT_AMBULATORY_CARE_PROVIDER_SITE_OTHER): Payer: Medicare Other

## 2019-08-21 ENCOUNTER — Other Ambulatory Visit: Payer: Self-pay

## 2019-08-21 DIAGNOSIS — Z23 Encounter for immunization: Secondary | ICD-10-CM

## 2019-08-21 NOTE — Progress Notes (Signed)
Fl

## 2019-09-06 ENCOUNTER — Other Ambulatory Visit: Payer: Self-pay | Admitting: Family Medicine

## 2019-09-06 NOTE — Telephone Encounter (Signed)
Please review. Thanks!  

## 2019-09-06 NOTE — Telephone Encounter (Signed)
Pt needing a refill:  HYDROcodone-acetaminophen (NORCO/VICODIN) 5-325 MG tablet  Please fill at:  [place], [place] - [address] [phone] (Phone) [phone] (Fax)    Thanks, [place]

## 2019-09-08 MED ORDER — HYDROCODONE-ACETAMINOPHEN 5-325 MG PO TABS: 1.0000 | ORAL_TABLET | Freq: Four times a day (QID) | ORAL | 0 refills | Status: DC | PRN

## 2019-09-24 ENCOUNTER — Ambulatory Visit
Admission: RE | Admit: 2019-09-24 | Discharge: 2019-09-24 | Disposition: A | Discharge status: Home / Self Care | Payer: Medicare Other | Source: Ambulatory Visit | Attending: Family Medicine | Admitting: Family Medicine

## 2019-09-24 DIAGNOSIS — Z1231 Encounter for screening mammogram for malignant neoplasm of breast: Secondary | ICD-10-CM | POA: Insufficient documentation

## 2019-10-02 ENCOUNTER — Ambulatory Visit (INDEPENDENT_AMBULATORY_CARE_PROVIDER_SITE_OTHER): Payer: Medicare Other | Admitting: Family Medicine

## 2019-10-02 ENCOUNTER — Other Ambulatory Visit: Payer: Self-pay

## 2019-10-02 ENCOUNTER — Encounter: Payer: Self-pay | Admitting: Family Medicine

## 2019-10-02 VITALS — BP 117/66 | HR 56 | Temp 97.7°F | Resp 18 | Ht 59.0 in | Wt 122.0 lb

## 2019-10-02 DIAGNOSIS — E559 Vitamin D deficiency, unspecified: Secondary | ICD-10-CM

## 2019-10-02 DIAGNOSIS — E039 Hypothyroidism, unspecified: Secondary | ICD-10-CM

## 2019-10-02 DIAGNOSIS — E782 Mixed hyperlipidemia: Secondary | ICD-10-CM | POA: Diagnosis not present

## 2019-10-02 DIAGNOSIS — I10 Essential (primary) hypertension: Secondary | ICD-10-CM

## 2019-10-02 DIAGNOSIS — Z0000 Encounter for general adult medical examination without abnormal findings: Secondary | ICD-10-CM

## 2019-10-02 DIAGNOSIS — K529 Noninfective gastroenteritis and colitis, unspecified: Secondary | ICD-10-CM

## 2019-10-02 MED ORDER — CHOLESTYRAMINE LIGHT 4 GM/DOSE PO POWD: ORAL | 3 refills | Status: DC

## 2019-10-02 NOTE — Patient Instructions (Signed)
.   Please review the attached list of medications and notify my office if there are any errors.   . Please bring all of your medications to every appointment so we can make sure that our medication list is the same as yours.   . It is especially important to get the annual flu vaccine this year. If you haven't had it already, please go to your pharmacy or call the office as soon as possible to schedule you flu shot.  

## 2019-10-02 NOTE — Progress Notes (Addendum)
Patient: Julia Salazar, Female    DOB: 20-Nov-1929, 85 y.o.   MRN: [id number] Visit Date: 10/02/2019   Today's Provider: Lelon Huh, MD   Chief Complaint  Patient presents with  . Annual Exam   Subjective:     Complete Physical Julia Salazar is a 85 y.o. female. She feels fairly well. She reports exercising 3 times a week in physical therapy at home.. She reports she is sleeping fairly well.  She is generally doing well, but her primary complaint is she continues to multiple watery stools every day unless she take Lomotil, which in turn leads to constipation. She did have a course of Xifaxin a few years ago which was initially effective, but very expensive. She is interested in trying cholestyramine. She has had a cholecystectomy.  -----------------------------------------------------------   Review of Systems  Constitutional: Negative.   HENT: Positive for hearing loss.   Eyes: Negative.   Respiratory: Negative.   Cardiovascular: Negative.   Gastrointestinal: Positive for diarrhea.  Endocrine: Negative.   Genitourinary: Negative.   Musculoskeletal: Positive for arthralgias, back pain and gait problem.  Skin: Negative.   Hematological: Negative.   Psychiatric/Behavioral: Negative.     Social History   Socioeconomic History  . Marital status: Widowed    Spouse name: Not on file  . Number of children: 3  . Years of education: LPN  . Highest education level: Associate degree: occupational, Hotel manager, or vocational program  Occupational History  . Occupation: retired  Scientific laboratory technician  . Financial resource strain: Not hard at all  . Food insecurity    Worry: Never true    Inability: Never true  . Transportation needs    Medical: No    Non-medical: No  Tobacco Use  . Smoking status: Never Smoker  . Smokeless tobacco: Never Used  Substance and Sexual Activity  . Alcohol use: No    Alcohol/week: 0.0 standard drinks  . Drug use: No  . Sexual activity: Not on  file  Lifestyle  . Physical activity    Days per week: 0 days    Minutes per session: 0 min  . Stress: To some extent  Relationships  . Social Herbalist on phone: Patient refused    Gets together: Patient refused    Attends religious service: Patient refused    Active member of club or organization: Patient refused    Attends meetings of clubs or organizations: Patient refused    Relationship status: Patient refused  . Intimate partner violence    Fear of current or ex partner: Patient refused    Emotionally abused: Patient refused    Physically abused: Patient refused    Forced sexual activity: Patient refused  Other Topics Concern  . Not on file  Social History Narrative  . Not on file    Past Medical History:  Diagnosis Date  . Depression   . Diverticulosis   . History of chicken pox   . History of measles   . History of mumps   . Hyperlipidemia   . Hypertension   . Ovarian cancer [facility]) 1995     Patient Active Problem List   Diagnosis Date Noted  . Hypothyroidism 05/14/2018  . Chronic diarrhea 10/05/2016  . Allergic rhinitis 11/24/2015  . Arthritis 11/24/2015  . Diverticulosis of colon 11/24/2015  . Family history of breast cancer 11/24/2015  . Fatigue 11/24/2015  . LBP (low back pain) 11/24/2015  . Arthritis of knee, degenerative 11/24/2015  .  POAG (primary open-angle glaucoma) 09/18/2013  . Edema 09/03/2009  . Insomnia 08/13/2009  . Vitamin D deficiency 03/29/2009  . Arthralgia of hip or thigh 03/27/2009  . Hyperlipidemia, mixed 03/17/2009  . Osteopenia 06/14/2006  . Colon polyp 07/09/2002  . Clinical depression 12/05/1998  . 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 Essential (primary) hypertension 12/05/1998  . Headache, migraine 12/05/1998  . History of ovarian cancer 12/05/1984  . H/O total hysterectomy 12/06/1971    Past Surgical History:  Procedure Laterality  Date  . ABDOMINAL HYSTERECTOMY     1973  . APPENDECTOMY  1986  . BACK SURGERY  05/2012   done at [facility] in [place]  . BILATERAL SALPINGOOPHORECTOMY  1986  . CATARACT EXTRACTION  2010   right eye  . CHOLECYSTECTOMY  1998  . COLON RESECTION  1995  . CORNEAL TRANSPLANT    . OOPHORECTOMY      Her family history includes Breast cancer in her sister; Breast cancer (age of onset: 65) in her sister; Colon cancer in her mother; Heart attack in her father; Leukemia in her sister.   Current Outpatient Medications:  .  amLODipine (NORVASC) 10 MG tablet, TAKE 1 TABLET DAILY, Disp: 90 tablet, Rfl: 3 .  aspirin 81 MG tablet, Take 1 tablet by mouth daily., Disp: , Rfl:  .  bimatoprost (LUMIGAN) 0.03 % ophthalmic solution, daily., Disp: , Rfl:  .  brimonidine (ALPHAGAN P) 0.1 % SOLN, Apply to eye daily., Disp: , Rfl:  .  brimonidine (ALPHAGAN) 0.2 % ophthalmic solution, INSTILL 1 DROP IN BOTH EYES TWICE A DAY, Disp: , Rfl:  .  brimonidine-timolol (COMBIGAN) 0.2-0.5 % ophthalmic solution, Place 1 drop into both eyes every 12 (twelve) hours. , Disp: , Rfl:  .  Calcium Carb-Cholecalciferol (CALCIUM 600 + D) 600-200 MG-UNIT TABS, Take 1 tablet by mouth daily., Disp: , Rfl:  .  candesartan (ATACAND) 8 MG tablet, TAKE 1 TABLET DAILY (REPLACES LOSARTAN), Disp: 90 tablet, Rfl: 4 .  carisoprodol (SOMA) 350 MG tablet, Take 1 tablet (350 mg total) by mouth every 6 (six) hours., Disp: 180 tablet, Rfl: 4 .  diphenhydrAMINE (BENADRYL ALLERGY) 25 mg capsule, Take 25 mg by mouth at bedtime. , Disp: , Rfl:  .  diphenoxylate-atropine (LOMOTIL) 2.5-0.025 MG tablet, Take 1 tablet by mouth 4 (four) times daily as needed., Disp: 180 tablet, Rfl: 1 .  dorzolamide-timolol (COSOPT) 22.3-6.8 MG/ML ophthalmic solution, Place into both eyes 2 (two) times daily., Disp: , Rfl:  .  FLUoxetine (PROZAC) 20 MG capsule, TAKE 1 CAPSULE DAILY, Disp: 90 capsule, Rfl: 4 .  furosemide (LASIX) 40 MG tablet, TAKE 1 TABLET DAILY,  Disp: 90 tablet, Rfl: 3 .  hydrochlorothiazide (HYDRODIURIL) 12.5 MG tablet, TAKE 1 TABLET DAILY, Disp: 90 tablet, Rfl: 4 .  HYDROcodone-acetaminophen (NORCO) 7.5-325 MG tablet, Take 1 tablet by mouth 4 (four) times daily as needed for moderate pain., Disp: 360 tablet, Rfl: 0 .  HYDROcodone-acetaminophen (NORCO/VICODIN) 5-325 MG tablet, Take 1 tablet by mouth every 6 (six) hours as needed for moderate pain., Disp: 360 tablet, Rfl: 0 .  Multiple Vitamins-Minerals (MULTIVITAMIN ADULT PO), Take 1 tablet by mouth daily., Disp: , Rfl:  .  omega-3 acid ethyl esters (LOVAZA) 1 g capsule, TAKE 4 CAPSULES DAILY, Disp: 360 capsule, Rfl: 4 .  potassium chloride SA (K-DUR) 20 MEQ tablet, TAKE 1 TABLET DAILY, Disp: 90 tablet, Rfl: 3 .  pravastatin (PRAVACHOL) 40 MG tablet, TAKE 1  TABLET DAILY, Disp: 90 tablet, Rfl: 3 .  prednisoLONE acetate (PRED FORTE) 1 % ophthalmic suspension, Place 1 drop into the left eye daily. , Disp: , Rfl:  .  Probiotic Product (DIGESTIVE ADVANTAGE GUMMIES PO), Take by mouth daily., Disp: , Rfl:  .  diclofenac sodium (VOLTAREN) 1 % GEL, Apply 4 g topically 4 (four) times daily. (Patient not taking: Reported on 10/02/2019), Disp: 100 g, Rfl: 3 .  meloxicam (MOBIC) 15 MG tablet, TAKE 1 TABLET DAILY AS NEEDED, Disp: 90 tablet, Rfl: 3 .  timolol (TIMOPTIC) 0.5 % ophthalmic solution, INSTILL 1 DROP TWICE A DAY IN EACH EYE, Disp: , Rfl:   Current Facility-Administered Medications:  .  methylPREDNISolone acetate (DEPO-MEDROL) injection 80 mg, 80 mg, Intra-articular, Once, Azarah Dacy, Kirstie Peri, MD  Patient Care Team: Birdie Sons, MD as PCP - General (Family Medicine) Oneta Rack, MD (Dermatology) Lorelee Cover., MD as Consulting Physician (Ophthalmology) Conception Chancy, DDS as Referring Physician (Dentistry) Vevelyn Royals, MD as Consulting Physician (Ophthalmology) Skaggs, Alric Quan, MD (Inactive) (Ophthalmology)     Objective:    Vitals: BP 117/66   Pulse (!) 56   Temp  97.7 F (36.5 C) (Temporal)   Resp 18   Ht 4\' 11"  (1.499 m)   Wt 122 lb (55.3 kg)   BMI 24.64 kg/m   Physical Exam   General Appearance:    Well developed, well nourished female. Alert, cooperative, in no acute distress, appears stated age   Head:    Normocephalic, without obvious abnormality, atraumatic  Eyes:    PERRL, conjunctiva/corneas clear, EOM's intact, fundi    benign, both eyes. Left pupil dilated and fixed  Ears:    Normal TM's and external ear canals, both ears  Nose:   Nares normal, septum midline, mucosa normal, no drainage    or sinus tenderness  Throat:   Lips, mucosa, and tongue normal; teeth and gums normal  Neck:   Supple, symmetrical, trachea midline, no adenopathy;    thyroid:  no enlargement/tenderness/nodules; no carotid   bruit or JVD  Back:     Symmetric, no curvature, ROM normal, no CVA tenderness  Lungs:     Clear to auscultation bilaterally, respirations unlabored  Chest Wall:    No tenderness or deformity   Heart:    Bradycardic. Normal rhythm. No murmurs, rubs, or gallops.   Breast Exam:    normal appearance, no masses or tenderness  Abdomen:     Soft, non-tender, bowel sounds active all four quadrants,    no masses, no organomegaly  Pelvic:    deferred and not indicated; post-menopausal, no abnormal Pap smears in past  Extremities:   All extremities are intact. No cyanosis or edema  Pulses:   2+ and symmetric all extremities  Skin:   Skin color, texture, turgor normal, no rashes or lesions  Lymph nodes:   Cervical, supraclavicular, and axillary nodes normal  Neurologic:   CNII-XII intact, normal strength, sensation and reflexes    throughout    Activities of Daily Living In your present state of health, do you have any difficulty performing the following activities: 02/12/2019  Hearing? N  Vision? Y  Comment Due to glaucoma and recent cornea transplant.  Difficulty concentrating or making decisions? N  Walking or climbing stairs? Y  Comment  Due to right knee pain.   Dressing or bathing? N  Doing errands, shopping? Y  Comment Does not drive currently.   Preparing Food and eating ? N  Using the Toilet? N  In the past six months, have you accidently leaked urine? Y  Comment Wears depends daily.   Do you have problems with loss of bowel control? Y  Comment Wears depends daily.   Managing your Medications? N  Managing your Finances? Y  Comment Daughter assists with finances.  Housekeeping or managing your Housekeeping? Y  Comment Does minimal cleaning. Has a Chartered certified accountant.   Some recent data might be hidden    Fall Risk Assessment Fall Risk  02/12/2019 02/08/2018 10/05/2016 11/24/2015  Falls in the past year? 0 No No Yes  Number falls in past yr: - - - 2 or more  Injury with Fall? - - - Yes     Depression Screen PHQ 2/9 Scores 02/12/2019 02/12/2019 02/08/2018 02/08/2018  PHQ - 2 Score 0 0 0 0  PHQ- 9 Score 1 - 1 -    6CIT Screen 02/08/2018  What Year? 0 points  What month? 0 points  What time? 0 points  Count back from 20 0 points  Months in reverse 0 points  Repeat phrase 2 points  Total Score 2       Assessment & Plan:    Annual Physical Reviewed patient's Family Medical History Reviewed and updated list of patient's medical providers Assessment of cognitive impairment was done Assessed patient's functional ability Established a written schedule for health screening [place] Completed and Reviewed  Exercise Activities and Dietary recommendations Goals    . DIET - DECREASE FRIED FOODS     Recommend to cut out all foods that are fried in diet.     . Exercise 3x per week (30 min per time)     Recommend to exercise or stretch for 3 days a week for at least 30 minutes at a time.     . Increase water intake     Starting 10/05/16, I will increase my water intake to 4 glasses a day.       Immunization History  Administered Date(s) Administered  . Fluad Quad(high Dose 65+) 08/21/2019  .  Influenza Split 08/09/2011  . Influenza, High Dose Seasonal PF 08/24/2015, 09/21/2016, 09/27/2018  . Influenza-Unspecified 09/25/2017  . Pneumococcal Conjugate-13 11/03/2014  . Pneumococcal Polysaccharide-23 10/17/2012  . Td 11/03/2014  . Zoster 11/07/2013    Health Maintenance  Topic Date Due  . DEXA SCAN  10/24/2017  . TETANUS/TDAP  11/03/2024  . INFLUENZA VACCINE  Completed  . PNA vac Low Risk Adult  Completed     Discussed health benefits of physical activity, and encouraged her to engage in regular exercise appropriate for her age and condition.    ------------------------------------------------------------------------------------------------------------   1. Annual physical exam   2. Vitamin D deficiency  - VITAMIN D 25 Hydroxy (Vit-D Deficiency, Fractures)  3. Essential (primary) hypertension Well controlled.  Continue current medications.    4. Hyperlipidemia, mixed She is tolerating pravastatin well with no adverse effects.   - Comprehensive metabolic panel - Lipid panel  5. Chronic diarrhea She does have history of cholecystectomy and may benefit from - cholestyramine light (PREVALITE) 4 GM/DOSE powder; 4 grams 1-3 times daily as needed for loose bowels  Dispense: 239.4 g; Refill: 3  6. Hypothyroidism, unspecified type  - TSH  The entirety of the information documented in the History of Present Illness, Review of Systems and Physical Exam were personally obtained by me. Portions of this information were initially documented by [facility], CMA and reviewed by me for thoroughness and  accuracy.     Lelon Huh, MD  [facility]

## 2019-10-03 LAB — COMPREHENSIVE METABOLIC PANEL
ALT: 14 IU/L (ref 0–32)
AST: 19 IU/L (ref 0–40)
Albumin/Globulin Ratio: 1.7 (ref 1.2–2.2)
Albumin: 4.3 g/dL (ref 3.6–4.6)
Alkaline Phosphatase: 72 IU/L (ref 39–117)
BUN/Creatinine Ratio: 21 (ref 12–28)
BUN: 30 mg/dL — ABNORMAL HIGH (ref 8–27)
Bilirubin Total: 0.4 mg/dL (ref 0.0–1.2)
CO2: 24 mmol/L (ref 20–29)
Calcium: 9.9 mg/dL (ref 8.7–10.3)
Chloride: 103 mmol/L (ref 96–106)
Creatinine, Ser: 1.43 mg/dL — ABNORMAL HIGH (ref 0.57–1.00)
GFR calc Af Amer: 38 mL/min/{1.73_m2} — ABNORMAL LOW (ref >59)
GFR calc non Af Amer: 33 mL/min/{1.73_m2} — ABNORMAL LOW (ref >59)
Globulin, Total: 2.5 g/dL (ref 1.5–4.5)
Glucose: 81 mg/dL (ref 65–99)
Potassium: 4.6 mmol/L (ref 3.5–5.2)
Sodium: 138 mmol/L (ref 134–144)
Total Protein: 6.8 g/dL (ref 6.0–8.5)

## 2019-10-03 LAB — LIPID PANEL
Chol/HDL Ratio: 6.3 ratio — ABNORMAL HIGH (ref 0.0–4.4)
Cholesterol, Total: 182 mg/dL (ref 100–199)
HDL: 29 mg/dL — ABNORMAL LOW (ref >39)
LDL Chol Calc (NIH): 118 mg/dL — ABNORMAL HIGH (ref 0–99)
Triglycerides: 199 mg/dL — ABNORMAL HIGH (ref 0–149)
VLDL Cholesterol Cal: 35 mg/dL (ref 5–40)

## 2019-10-03 LAB — TSH: TSH: 6.5 u[IU]/mL — ABNORMAL HIGH (ref 0.450–4.500)

## 2019-10-03 LAB — VITAMIN D 25 HYDROXY (VIT D DEFICIENCY, FRACTURES): Vit D, 25-Hydroxy: 49.6 ng/mL (ref 30.0–100.0)

## 2019-10-04 ENCOUNTER — Telehealth: Payer: Self-pay

## 2019-10-04 NOTE — Telephone Encounter (Signed)
-----   Message from Birdie Sons, MD sent at 10/04/2019  1:03 PM EDT ----- Kidney functions have declined, need to drink more water. Rest of labs are good. Continue current medications.  Follow up for BP and recheck labs in about 4 months.

## 2019-10-04 NOTE — Telephone Encounter (Signed)
Patient advised. A 4 month follow up scheduled.

## 2019-10-07 ENCOUNTER — Other Ambulatory Visit: Payer: Self-pay | Admitting: Family Medicine

## 2019-10-07 DIAGNOSIS — M1712 Unilateral primary osteoarthritis, left knee: Secondary | ICD-10-CM

## 2019-10-07 MED ORDER — HYDROCODONE-ACETAMINOPHEN 7.5-325 MG PO TABS: 1.0000 | ORAL_TABLET | Freq: Four times a day (QID) | ORAL | 0 refills | Status: DC | PRN

## 2019-10-07 MED ORDER — CARISOPRODOL 350 MG PO TABS: 350.0000 mg | ORAL_TABLET | Freq: Four times a day (QID) | ORAL | 4 refills | Status: DC

## 2019-10-07 NOTE — Telephone Encounter (Signed)
Pt needing refills on:  carisoprodol (SOMA) 350 MG tablet HYDROcodone-acetaminophen (NORCO) 7.5-325 MG tablet  Please fill with:  [facility], [place] - [address] [phone] (Phone) [phone] (Fax)   [place], [place]

## 2019-10-07 NOTE — Telephone Encounter (Signed)
Last OV 09/06/2019 and last refill was 7/30 for Hydrocodone and 09/25/2018 for [employer]

## 2019-10-29 ENCOUNTER — Encounter: Payer: Self-pay | Admitting: Family Medicine

## 2019-12-10 ENCOUNTER — Other Ambulatory Visit: Payer: Self-pay | Admitting: Family Medicine

## 2019-12-11 MED ORDER — HYDROCODONE-ACETAMINOPHEN 5-325 MG PO TABS: 1.0000 | ORAL_TABLET | Freq: Four times a day (QID) | ORAL | 0 refills | Status: DC | PRN

## 2020-01-05 ENCOUNTER — Other Ambulatory Visit: Payer: Self-pay | Admitting: Family Medicine

## 2020-01-05 DIAGNOSIS — M1712 Unilateral primary osteoarthritis, left knee: Secondary | ICD-10-CM

## 2020-01-06 MED ORDER — HYDROCODONE-ACETAMINOPHEN 7.5-325 MG PO TABS: 1.0000 | ORAL_TABLET | Freq: Four times a day (QID) | ORAL | 0 refills | Status: DC | PRN

## 2020-01-11 ENCOUNTER — Encounter: Payer: Self-pay | Admitting: Family Medicine

## 2020-01-13 ENCOUNTER — Telehealth: Payer: Self-pay

## 2020-01-13 ENCOUNTER — Encounter: Payer: Self-pay | Admitting: Family Medicine

## 2020-01-13 NOTE — Telephone Encounter (Signed)
Please advise which dose of Hydrocodone patient is supposed to be on.

## 2020-01-13 NOTE — Telephone Encounter (Signed)
Melanie with [facility] advised.

## 2020-01-13 NOTE — Telephone Encounter (Signed)
She alternates between the 5-325 and the 7.5-325

## 2020-01-13 NOTE — Telephone Encounter (Signed)
Copied from [place] [phone]. Topic: General - Inquiry >> Jan 13, 2020  9:48 AM Percell Belt A wrote: Reason for CRM: [facility] called in, they stated they need more info regarding her hydrocodone.  They are need to know which MG she is on.  Best number [phone] Ref# [id number]

## 2020-01-13 NOTE — Telephone Encounter (Signed)
Copied from [place] [id number]. Topic: General - Other >> Jan 13, 2020  4:08 PM Leward Quan A wrote: Reason for CRM: [facility] to inquire if patient is taking both HYDROcodone-acetaminophen ([place]) 7.5-325 MG tablet  and HYDROcodone-acetaminophen (NORCO/VICODIN) 5-325 MG tablet Please call  Ph# [phone] ref # [id number]

## 2020-01-20 ENCOUNTER — Telehealth: Payer: Self-pay

## 2020-01-20 NOTE — Telephone Encounter (Signed)
Copied from [place] [id number]. Topic: Appointment Scheduling - Scheduling Inquiry for Clinic >> Jan 20, 2020 10:01 AM Percell Belt A wrote: Reason for CRM: pt called in and stated that she has a cpe on 3/19 along with her AWV .  She would like to know why the CPE was cancelled and changed to a reg 4 month fu on 3/2.  She would like to have both of this visit on the same day.  Please advise  Best number [phone]

## 2020-01-23 ENCOUNTER — Other Ambulatory Visit: Payer: Self-pay | Admitting: Family Medicine

## 2020-01-23 DIAGNOSIS — K529 Noninfective gastroenteritis and colitis, unspecified: Secondary | ICD-10-CM

## 2020-01-23 NOTE — Telephone Encounter (Signed)
Requested medication (s) are due for refill today: yes  Requested medication (s) are on the active medication list: no  Last refill: 12/28/19  Future visit scheduled: no  Notes to clinic:  no valid encounter within last 12 months   Requested Prescriptions  Pending Prescriptions Disp Refills   PREVALITE 4 GM/DOSE powder [Pharmacy Med Name: PREVALITE POWDER] 231 g 3    Sig: [place] 1-3 TIMES DAILY AS NEEDED FOR LOOSE BOWELS      Cardiovascular:  Antilipid - Bile Acid Sequestrants Failed - 01/23/2020  1:31 PM      Failed - LDL in normal range and within 360 days    LDL Chol Calc (NIH)  Date Value Ref Range Status  10/02/2019 118 (H) 0 - 99 mg/dL Final          Failed - HDL in normal range and within 360 days    HDL  Date Value Ref Range Status  10/02/2019 29 (L) >39 mg/dL Final          Failed - Triglycerides in normal range and within 360 days    Triglycerides  Date Value Ref Range Status  10/02/2019 199 (H) 0 - 149 mg/dL Final          Failed - Valid encounter within last 12 months    Recent Outpatient Visits           3 months ago Annual physical exam   [facility] Birdie Sons, MD   1 year ago Hypothyroidism, unspecified type   [facility] Birdie Sons, MD   1 year ago Impacted cerumen of right ear   [facility] Birdie Sons, MD   1 year ago Essential (primary) hypertension   [facility] Birdie Sons, MD   3 years ago Medicare annual wellness visit, subsequent   [facility] Birdie Sons, MD       Future Appointments             In 1 week Fisher, Kirstie Peri, MD [facility]   In 4 weeks  [facility], [facility]            Passed - Total Cholesterol in normal range and within 360 days    Cholesterol, Total  Date Value Ref Range Status  10/02/2019 182 100 - 199 mg/dL Final

## 2020-02-04 ENCOUNTER — Other Ambulatory Visit: Payer: Self-pay

## 2020-02-04 ENCOUNTER — Ambulatory Visit (INDEPENDENT_AMBULATORY_CARE_PROVIDER_SITE_OTHER): Payer: Medicare Other | Admitting: Family Medicine

## 2020-02-04 ENCOUNTER — Encounter: Payer: Self-pay | Admitting: Family Medicine

## 2020-02-04 VITALS — BP 146/80 | HR 64 | Temp 96.8°F | Resp 16 | Wt 120.0 lb

## 2020-02-04 DIAGNOSIS — R609 Edema, unspecified: Secondary | ICD-10-CM

## 2020-02-04 DIAGNOSIS — I10 Essential (primary) hypertension: Secondary | ICD-10-CM

## 2020-02-04 DIAGNOSIS — E2839 Other primary ovarian failure: Secondary | ICD-10-CM

## 2020-02-04 DIAGNOSIS — K529 Noninfective gastroenteritis and colitis, unspecified: Secondary | ICD-10-CM | POA: Diagnosis not present

## 2020-02-04 MED ORDER — CHOLESTYRAMINE LIGHT 4 GM/DOSE PO POWD: ORAL | Status: DC

## 2020-02-04 MED ORDER — CANDESARTAN CILEXETIL 8 MG PO TABS: ORAL_TABLET | ORAL | 4 refills | Status: DC

## 2020-02-04 NOTE — Progress Notes (Signed)
Patient: Julia Salazar Female    DOB: [birth year]   86 y.o.   MRN: [id number] Visit Date: 02/04/2020  Today's Provider: Lelon Huh, MD   Chief Complaint  Patient presents with  . Hypertension  . Hypothyroidism   Subjective:     HPI  Hypertension, follow-up:  BP Readings from Last 3 Encounters:  02/04/20 (!) 146/80  10/02/19 117/66  02/12/19 134/68    She was last seen for hypertension 3 months ago.  BP at that visit was 117/66. Management since that visit includes advising patient to drink more water due to decline in kidney functions. She reports fair compliance with treatment.  Candesartan was last dispensed by [facility] on 08-30-2019 and patient does not have it on her of medications that she brought with her today.   She is not having side effects.  She is exercising. She is adherent to low salt diet.   Outside blood pressures are not checked. She is experiencing lower extremity edema.  Patient denies chest pain, chest pressure/discomfort, claudication, dyspnea, exertional chest pressure/discomfort, fatigue, irregular heart beat, near-syncope, orthopnea, palpitations, paroxysmal nocturnal dyspnea, syncope and tachypnea.   Cardiovascular risk factors include advanced age (older than 45 for men, 3 for women) and hypertension.  Use of agents associated with hypertension: NSAIDS.     Weight trend: stable Wt Readings from Last 3 Encounters:  02/04/20 120 lb (54.4 kg)  10/02/19 122 lb (55.3 kg)  02/12/19 127 lb 9.6 oz (57.9 kg)    Current diet: well balanced  She reports that she has severe swelling in her feet and ankles when she drinks more than 30 oz of water a day.   Allergies  Allergen Reactions  . Lovastatin   . Sertraline Hcl   . Etodolac Rash     Current Outpatient Medications:  .  amLODipine (NORVASC) 10 MG tablet, TAKE 1 TABLET DAILY, Disp: 90 tablet, Rfl: 3 .  aspirin 81 MG tablet, Take 1 tablet by mouth daily., Disp: , Rfl:  .   brimonidine-timolol (COMBIGAN) 0.2-0.5 % ophthalmic solution, Place 1 drop into both eyes every 12 (twelve) hours., Disp: , Rfl:  .  Calcium Carb-Cholecalciferol (CALCIUM 600 + D) 600-200 MG-UNIT TABS, Take 1 tablet by mouth daily., Disp: , Rfl:  .  carisoprodol (SOMA) 350 MG tablet, Take 1 tablet (350 mg total) by mouth every 6 (six) hours., Disp: 180 tablet, Rfl: 4 .  cholestyramine light (PREVALITE) 4 GM/DOSE powder, MIX 4 GRAMS WITH FLUIDS AND DRINK 1-3 TIMES DAILY AS NEEDED FOR LOOSE BOWELS, Disp: 231 g, Rfl: 3 .  diphenhydrAMINE (BENADRYL ALLERGY) 25 mg capsule, Take 25 mg by mouth at bedtime. , Disp: , Rfl:  .  diphenoxylate-atropine (LOMOTIL) 2.5-0.025 MG tablet, Take 1 tablet by mouth 4 (four) times daily as needed., Disp: 180 tablet, Rfl: 1 .  FLUoxetine (PROZAC) 20 MG capsule, TAKE 1 CAPSULE DAILY, Disp: 90 capsule, Rfl: 4 .  furosemide (LASIX) 40 MG tablet, TAKE 1 TABLET DAILY, Disp: 90 tablet, Rfl: 3 .  hydrochlorothiazide (HYDRODIURIL) 12.5 MG tablet, TAKE 1 TABLET DAILY, Disp: 90 tablet, Rfl: 4 .  HYDROcodone-acetaminophen (NORCO) 7.5-325 MG tablet, Take 1 tablet by mouth 4 (four) times daily as needed for moderate pain., Disp: 360 tablet, Rfl: 0 .  HYDROcodone-acetaminophen (NORCO/VICODIN) 5-325 MG tablet, Take 1 tablet by mouth every 6 (six) hours as needed for moderate pain., Disp: 360 tablet, Rfl: 0 .  meloxicam (MOBIC) 15 MG tablet, TAKE 1 TABLET DAILY AS  NEEDED, Disp: 90 tablet, Rfl: 3 .  Multiple Vitamins-Minerals (MULTIVITAMIN ADULT PO), Take 1 tablet by mouth daily., Disp: , Rfl:  .  omega-3 acid ethyl esters (LOVAZA) 1 g capsule, TAKE 4 CAPSULES DAILY, Disp: 360 capsule, Rfl: 4 .  potassium chloride SA (K-DUR) 20 MEQ tablet, TAKE 1 TABLET DAILY, Disp: 90 tablet, Rfl: 3 .  pravastatin (PRAVACHOL) 40 MG tablet, TAKE 1 TABLET DAILY, Disp: 90 tablet, Rfl: 3 .  prednisoLONE acetate (PRED FORTE) 1 % ophthalmic suspension, Place 1 drop into the left eye daily. , Disp: , Rfl:  .   candesartan (ATACAND) 8 MG tablet, TAKE 1 TABLET DAILY (REPLACES LOSARTAN) (Patient not taking: Reported on 02/04/2020), Disp: 90 tablet, Rfl: 4  Review of Systems  Constitutional: Negative for appetite change, chills, fatigue and fever.  Respiratory: Negative for chest tightness and shortness of breath.   Cardiovascular: Positive for leg swelling (in feet). Negative for chest pain and palpitations.  Gastrointestinal: Negative for abdominal pain, nausea and vomiting.  Neurological: Negative for dizziness and weakness.    Social History   Tobacco Use  . Smoking status: Never Smoker  . Smokeless tobacco: Never Used  Substance Use Topics  . Alcohol use: No    Alcohol/week: 0.0 standard drinks      Objective:   BP (!) 146/80 (BP Location: Right Arm, Cuff Size: Normal)   Pulse 64   Temp (!) 96.8 F (36 C) (Temporal)   Resp 16   Wt 120 lb (54.4 kg)   SpO2 98% Comment: room air  BMI 24.24 kg/m  Vitals:   02/04/20 1103 02/04/20 1106  BP: (!) 144/64 (!) 146/80  Pulse: 64   Resp: 16   Temp: (!) 96.8 F (36 C)   TempSrc: Temporal   SpO2: 98%   Weight: 120 lb (54.4 kg)   Body mass index is 24.24 kg/m.   Physical Exam  General appearance: Well developed, well nourished female, cooperative and in no acute distress Head: Normocephalic, without obvious abnormality, atraumatic Respiratory: Respirations even and unlabored, normal respiratory rate Extremities: All extremities are intact.  Skin: Skin color, texture, turgor normal. No rashes seen  Psych: Appropriate mood and affect. Neurologic: Mental status: Alert, oriented to person, place, and time, thought content appropriate.       Assessment & Plan    1. Essential (primary) hypertension It seems that she ran out of candesartan about 2 months ago which is probably contributing to edema. She does not want to use the mail order pharmacy so prescription - candesartan (ATACAND) 8 MG tablet; Take one tablet  Dispense: 90 tablet;  [facility]  2. Edema, unspecified type Multifactorial due to amlodipine, running out of candesartan and some underlying CKD. Advised if her BP comes down significantly she could probably reduce dosage of amlodipine if she continues to have trouble with edema. Encouraged to drink at least 6 8 oz glasses of fluid every day  3. Chronic diarrhea She states that cholestyramine works well, but causes constipation if she takes it consistently. Advised to try taking it intermittently , but on a schedule - cholestyramine light (PREVALITE) 4 GM/DOSE powder; MIX 2 GRAMS WITH FLUIDS AND DRINK EVERY 2-3 DAYS AS NEEDED FOR LOOSE BOWELS  Dispense: 231 g  4. Estrogen deficiency Due for - DG Bone density [place]; Future  Return to check BP and renal functions in about 3 months.     Lelon Huh, MD  [facility]

## 2020-02-04 NOTE — Patient Instructions (Signed)
.   Please review the attached list of medications and notify my office if there are any errors.   . Please bring all of your medications to every appointment so we can make sure that our medication list is the same as yours.   

## 2020-02-07 ENCOUNTER — Other Ambulatory Visit: Payer: Self-pay | Admitting: Family Medicine

## 2020-02-07 DIAGNOSIS — E782 Mixed hyperlipidemia: Secondary | ICD-10-CM

## 2020-02-07 MED ORDER — OMEGA-3-ACID ETHYL ESTERS 1 G PO CAPS: 4.0000 | ORAL_CAPSULE | Freq: Every day | ORAL | 4 refills | Status: DC

## 2020-02-07 NOTE — Telephone Encounter (Signed)
[facility] faxed refill request for the following medications:  omega-3 acid ethyl esters (LOVAZA) 1 g capsule  90 day supply Last Rx: 12/22/2018 LOV: 02/04/2020 Please advise. Thanks TNP

## 2020-02-17 NOTE — Progress Notes (Signed)
Subjective:   Julia Salazar is a 86 y.o. female who presents for Medicare Annual (Subsequent) preventive examination.    This visit is being conducted through telemedicine due to the COVID-19 pandemic. This patient has given me verbal consent via doximity to conduct this visit, patient states they are participating from their home address. Some vital signs may be absent or patient reported.    Patient identification: identified by name, DOB, and current address  Review of Systems:  N/A  Cardiac Risk Factors include: advanced age (>79men, >35 women);dyslipidemia;hypertension     Objective:     Vitals: BP 110/66 (BP Location: Left Arm)   Pulse 73   There is no height or weight on file to calculate BMI. Unable to obtain ALL vitals due to visit being conducted via telephonically. Vitals documented were taken by patient.   Advanced Directives 02/18/2020 02/12/2019 02/08/2018 10/05/2016  Does Patient Have a Medical Advance Directive? Yes Yes Yes Yes  Type of Paramedic of [place];Living will [place];Living will [place];Living will;Out of facility DNR (pink MOST or yellow form) [place];Living will  Copy of University in Chart? Yes - validated most recent copy scanned in chart (See row information) Yes - validated most recent copy scanned in chart (See row information) Yes Yes    Tobacco Social History   Tobacco Use  Smoking Status Never Smoker  Smokeless Tobacco Never Used     Counseling given: Not Answered   Clinical Intake:  Pre-visit preparation completed: Yes  Pain : 0-10 Pain Score: 4  Pain Type: Chronic pain Pain Location: Back Pain Orientation: Lower Pain Descriptors / Indicators: Aching Pain Frequency: Intermittent     Nutritional Risks: Nausea/ vomitting/ diarrhea(Occasional diarrhea due to Prevalite.) Diabetes: No  How often do you need to have someone help  you when you read instructions, pamphlets, or other written materials from your doctor or pharmacy?: 1 - Never  Interpreter Needed?: No  Information entered by :: [facility], LPN  Past Medical History:  Diagnosis Date  . Depression   . Diverticulosis   . History of chicken pox   . History of measles   . History of mumps   . Hyperlipidemia   . Hypertension   . Ovarian cancer ([place]) 1995   Past Surgical History:  Procedure Laterality Date  . ABDOMINAL HYSTERECTOMY     1973  . APPENDECTOMY  1986  . BACK SURGERY  05/2012   done at [facility] in [place]  . BILATERAL SALPINGOOPHORECTOMY  1986  . CATARACT EXTRACTION  2010   right eye  . CHOLECYSTECTOMY  1998  . COLON RESECTION  1995  . CORNEAL TRANSPLANT    . DESCEMETS STRIPPING AUTOMATED ENDOTHELIAL KERATOPLASTY Left 08/30/2018   Alvie Heidelberg, MD [facility]  . OOPHORECTOMY     Family History  Problem Relation Age of Onset  . Colon cancer Mother   . Heart attack Father   . Breast cancer Sister 34  . Leukemia Sister   . Hodgkin's lymphoma Sister    Social History   Socioeconomic History  . Marital status: Widowed    Spouse name: Not on file  . Number of children: 3  . Years of education: LPN  . Highest education level: Associate degree: occupational, Hotel manager, or vocational program  Occupational History  . Occupation: retired  Tobacco Use  . Smoking status: Never Smoker  . Smokeless tobacco: Never Used  Substance and Sexual  Activity  . Alcohol use: No    Alcohol/week: 0.0 standard drinks  . Drug use: No  . Sexual activity: Not on file  Other Topics Concern  . Not on file  Social History Narrative  . Not on file   Social Determinants of Health   Financial Resource Strain: Low Risk   . Difficulty of Paying Living Expenses: Not hard at all  Food Insecurity: No Food Insecurity  . Worried About Charity fundraiser in the Last Year: Never true  . Ran Out of Food in the Last Year: Never true  Transportation  Needs: No Transportation Needs  . Lack of Transportation (Medical): No  . Lack of Transportation (Non-Medical): No  Physical Activity: Inactive  . Days of Exercise per Week: 0 days  . Minutes of Exercise per Session: 0 min  Stress: No Stress Concern Present  . Feeling of Stress : Not at all  Social Connections: Somewhat Isolated  . Frequency of Communication with Friends and Family: More than three times a week  . Frequency of Social Gatherings with Friends and Family: More than three times a week  . Attends Religious Services: More than 4 times per year  . Active Member of Clubs or Organizations: No  . Attends Archivist Meetings: Never  . Marital Status: Widowed    Outpatient Encounter Medications as of 02/18/2020  Medication Sig  . amLODipine (NORVASC) 10 MG tablet TAKE 1 TABLET DAILY  . aspirin 81 MG tablet Take 1 tablet by mouth daily.  . brimonidine-timolol (COMBIGAN) 0.2-0.5 % ophthalmic solution Place 1 drop into both eyes every 12 (twelve) hours.  . Calcium Carb-Cholecalciferol (CALCIUM 600 + D) 600-200 MG-UNIT TABS Take 1 tablet by mouth daily.  . candesartan (ATACAND) 8 MG tablet Take one tablet (Patient taking differently: Take 8 mg by mouth daily. )  . carisoprodol (SOMA) 350 MG tablet Take 1 tablet (350 mg total) by mouth every 6 (six) hours.  . cholestyramine light (PREVALITE) 4 GM/DOSE powder MIX 2 GRAMS WITH FLUIDS AND DRINK EVERY 2-3 DAYS AS NEEDED FOR LOOSE BOWELS (Patient taking differently: MIX 2 GRAMS WITH FLUIDS AND DRINK EVERY 2-3 DAYS AS NEEDED FOR LOOSE BOWELS)  . diphenhydrAMINE (BENADRYL ALLERGY) 25 mg capsule Take 25 mg by mouth at bedtime.   . diphenoxylate-atropine (LOMOTIL) 2.5-0.025 MG tablet Take 1 tablet by mouth 4 (four) times daily as needed.  Marland Kitchen FLUoxetine (PROZAC) 20 MG capsule TAKE 1 CAPSULE DAILY  . furosemide (LASIX) 40 MG tablet TAKE 1 TABLET DAILY  . hydrochlorothiazide (HYDRODIURIL) 12.5 MG tablet TAKE 1 TABLET DAILY  .  HYDROcodone-acetaminophen (NORCO) 7.5-325 MG tablet Take 1 tablet by mouth 4 (four) times daily as needed for moderate pain.  Marland Kitchen HYDROcodone-acetaminophen (NORCO/VICODIN) 5-325 MG tablet Take 1 tablet by mouth every 6 (six) hours as needed for moderate pain.  . meloxicam (MOBIC) 15 MG tablet TAKE 1 TABLET DAILY AS NEEDED  . Multiple Vitamins-Minerals (MULTIVITAMIN ADULT PO) Take 1 tablet by mouth daily.  Marland Kitchen omega-3 acid ethyl esters (LOVAZA) 1 g capsule Take 4 capsules (4 g total) by mouth daily.  . potassium chloride SA (K-DUR) 20 MEQ tablet TAKE 1 TABLET DAILY  . pravastatin (PRAVACHOL) 40 MG tablet TAKE 1 TABLET DAILY  . prednisoLONE acetate (PRED FORTE) 1 % ophthalmic suspension Place 1 drop into the left eye daily.    No facility-administered encounter medications on file as of 02/18/2020.    Activities of Daily Living In your present state of health, do  you have any difficulty performing the following activities: 02/18/2020  Hearing? N  Vision? Y  Comment Due to issues after a corneal transplant in the left eye.  Difficulty concentrating or making decisions? N  Walking or climbing stairs? Y  Comment Due to knee pains and balance issues.  Dressing or bathing? N  Doing errands, shopping? Y  Comment Does not drive.  Preparing Food and eating ? N  Using the Toilet? N  In the past six months, have you accidently leaked urine? Y  Comment Wears protection at all times.  Do you have problems with loss of bowel control? N  Managing your Medications? N  Managing your Finances? N  Housekeeping or managing your Housekeeping? Y  Comment Has a Chartered certified accountant for cleaning.  Some recent data might be hidden    Patient Care Team: Birdie Sons, MD as PCP - General (Family Medicine) Oneta Rack, MD (Dermatology) Lorelee Cover., MD as Consulting Physician (Ophthalmology) Conception Chancy, DDS as Referring Physician (Dentistry)    Assessment:   This is a routine wellness examination for  Julia Salazar.  Exercise Activities and Dietary recommendations Current Exercise Habits: Home exercise routine, Type of exercise: stretching, Time (Minutes): 25, Frequency (Times/Week): 3, Weekly Exercise (Minutes/Week): 75, Intensity: Mild, Exercise limited by: orthopedic condition(s)  Goals    . Increase water intake     Starting 10/05/16, I will increase my water intake to 4 glasses a day.       Fall Risk: Fall Risk  02/18/2020 02/04/2020 02/12/2019 02/08/2018 10/05/2016  Falls in the past year? 1 0 0 No No  Number falls in past yr: 0 0 - - -  Injury with Fall? 0 0 - - -  Risk for fall due to : Impaired balance/gait - - - -  Follow up Falls prevention discussed Falls evaluation completed - - -    FALL RISK PREVENTION PERTAINING TO THE HOME:  Any stairs in or around the home? No  If so, are there any without handrails? N/A  Home free of loose throw rugs in walkways, pet beds, electrical cords, etc? Yes  Adequate lighting in your home to reduce risk of falls? Yes   ASSISTIVE DEVICES UTILIZED TO PREVENT FALLS:  Life alert? Yes  Use of a cane, walker or w/c? Yes  Grab bars in the bathroom? Yes  Shower chair or bench in shower? Yes  Elevated toilet seat or a handicapped toilet? No    TIMED UP AND GO:  Was the test performed? No .    Depression Screen PHQ 2/9 Scores 02/18/2020 02/12/2019 02/12/2019 02/08/2018  PHQ - 2 Score 0 0 0 0  PHQ- 9 Score - 1 - 1     Cognitive Function: Declined today.     6CIT Screen 02/08/2018 10/05/2016  What Year? 0 points 0 points  What month? 0 points 0 points  What time? 0 points 0 points  Count back from 20 0 points 0 points  Months in reverse 0 points 0 points  Repeat phrase 2 points 2 points  Total Score 2 2    Immunization History  Administered Date(s) Administered  . Fluad Quad(high Dose 65+) 08/21/2019  . Influenza Split 08/09/2011  . Influenza, High Dose Seasonal PF 08/24/2015, 09/21/2016, 09/27/2018  . Influenza-Unspecified 09/25/2017    . Moderna SARS-COVID-2 Vaccination 12/18/2019, 01/15/2020  . Pneumococcal Conjugate-13 11/03/2014  . Pneumococcal Polysaccharide-23 10/17/2012  . Td 11/03/2014  . Zoster 11/07/2013    Qualifies for Shingles Vaccine?  Yes  Zostavax completed 11/07/13. Due for Shingrix. Pt has been advised to call insurance company to determine out of pocket expense. Advised may also receive vaccine at local pharmacy or Health Dept. Verbalized acceptance and understanding.  Tdap: Up to date  Flu Vaccine: Up to date  Pneumococcal Vaccine: Completed series  Screening Tests Health Maintenance  Topic Date Due  . DEXA SCAN  10/24/2017  . TETANUS/TDAP  11/03/2024  . INFLUENZA VACCINE  Completed  . PNA vac Low Risk Adult  Completed    Cancer Screenings:  Colorectal Screening: No longer required.   Mammogram: No longer required.   Bone Density: Completed 10/24/12. Results reflect OSTEOPENIA. Repeat every 5 years. Ordered 02/04/20. Pt provided with [place] contact information to call re:apt.  Lung Cancer Screening: (Low Dose CT Chest recommended if Age 44-80 years, 30 pack-year currently smoking OR have quit w/in 15years.) does not qualify.   Additional Screening:  Vision Screening: Recommended annual ophthalmology exams for early detection of glaucoma and other disorders of the eye.  Dental Screening: Recommended annual dental exams for proper oral hygiene  Community Resource Referral:  CRR required this visit?  No       Plan:  I have personally reviewed and addressed the Medicare Annual Wellness questionnaire and have noted the following in the patient's chart:  A. Medical and social history B. Use of alcohol, tobacco or illicit drugs  C. Current medications and supplements D. Functional ability and status E.  Nutritional status F.  Physical activity G. Advance directives H. List of other physicians I.  Hospitalizations, surgeries, and ER visits in previous 12 months J.   [place] such as hearing and vision if needed, cognitive and depression L. Referrals and appointments   In addition, I have reviewed and discussed with patient certain preventive protocols, quality metrics, and best practice recommendations. A written personalized care plan for preventive services as well as general preventive health recommendations were provided to patient. Nurse Health Advisor  Signed,    Arminta Gamm [place], [place]  [id number] Nurse Health Advisor   Nurse Notes: None.

## 2020-02-18 ENCOUNTER — Other Ambulatory Visit: Payer: Self-pay

## 2020-02-18 ENCOUNTER — Ambulatory Visit (INDEPENDENT_AMBULATORY_CARE_PROVIDER_SITE_OTHER): Payer: Medicare Other

## 2020-02-18 VITALS — BP 110/66 | HR 73

## 2020-02-18 DIAGNOSIS — Z0000 Encounter for general adult medical examination without abnormal findings: Secondary | ICD-10-CM

## 2020-02-18 NOTE — Patient Instructions (Signed)
Julia Salazar , Thank you for taking time to come for your Medicare Wellness Visit. I appreciate your ongoing commitment to your health goals. Please review the following plan we discussed and let me know if I can assist you in the future.   Screening recommendations/referrals: Colonoscopy: No longer required.  Mammogram: No longer required.  Bone Density: Currently due. Ordered 02/04/20. Pt to call [facility] re:apt. Recommended yearly ophthalmology/optometry visit for glaucoma screening and checkup Recommended yearly dental visit for hygiene and checkup  Vaccinations: Influenza vaccine: Up to date Pneumococcal vaccine: Completed series Tdap vaccine: Up to date, due 10/2024 Shingles vaccine: Pt declines today.     Advanced directives: Currently on file  Conditions/risks identified: Recommend to continue to increase water intake to 6-8 8 oz glasses a day.   Next appointment: 05/13/20 @ 11:00 AM with Dr Caryn Section    Preventive Care 56 Years and Older, Female Preventive care refers to lifestyle choices and visits with your health care provider that can promote health and wellness. What does preventive care include?  A yearly physical exam. This is also called an annual well check.  Dental exams once or twice a year.  Routine eye exams. Ask your health care provider how often you should have your eyes checked.  Personal lifestyle choices, including:  Daily care of your teeth and gums.  Regular physical activity.  Eating a healthy diet.  Avoiding tobacco and drug use.  Limiting alcohol use.  Practicing safe sex.  Taking low-dose aspirin every day.  Taking vitamin and mineral supplements as recommended by your health care provider. What happens during an annual well check? The services and screenings done by your health care provider during your annual well check will depend on your age, overall health, lifestyle risk factors, and family history of disease. Counseling  Your health  care provider may ask you questions about your:  Alcohol use.  Tobacco use.  Drug use.  Emotional well-being.  Home and relationship well-being.  Sexual activity.  Eating habits.  History of falls.  Memory and ability to understand (cognition).  Work and work Statistician.  Reproductive health. Screening  You may have the following tests or measurements:  Height, weight, and BMI.  Blood pressure.  Lipid and cholesterol levels. These may be checked every 5 years, or more frequently if you are over 2 years old.  Skin check.  Lung cancer screening. You may have this screening every year starting at age 61 if you have a 30-pack-year history of smoking and currently smoke or have quit within the past 15 years.  Fecal occult blood test (FOBT) of the stool. You may have this test every year starting at age 39.  Flexible sigmoidoscopy or colonoscopy. You may have a sigmoidoscopy every 5 years or a colonoscopy every 10 years starting at age 97.  Hepatitis C blood test.  Hepatitis B blood test.  Sexually transmitted disease (STD) testing.  Diabetes screening. This is done by checking your blood sugar (glucose) after you have not eaten for a while (fasting). You may have this done every 1-3 years.  Bone density scan. This is done to screen for osteoporosis. You may have this done starting at age 84.  Mammogram. This may be done every 1-2 years. Talk to your health care provider about how often you should have regular mammograms. Talk with your health care provider about your test results, treatment options, and if necessary, the need for more tests. Vaccines  Your health care provider may recommend  certain vaccines, such as:  Influenza vaccine. This is recommended every year.  Tetanus, diphtheria, and acellular pertussis (Tdap, Td) vaccine. You may need a Td booster every 10 years.  Zoster vaccine. You may need this after age 52.  Pneumococcal 13-valent conjugate  (PCV13) vaccine. One dose is recommended after age 84.  Pneumococcal polysaccharide (PPSV23) vaccine. One dose is recommended after age 34. Talk to your health care provider about which screenings and vaccines you need and how often you need them. This information is not intended to replace advice given to you by your health care provider. Make sure you discuss any questions you have with your health care provider. Document Released: 12/18/2015 Document Revised: 08/10/2016 Document Reviewed: 09/22/2015 Elsevier Interactive Patient Education  2017 [employer] Prevention in the Home Falls can cause injuries. They can happen to people of all ages. There are many things you can do to make your home safe and to help prevent falls. What can I do on the outside of my home?  Regularly fix the edges of walkways and driveways and fix any cracks.  Remove anything that might make you trip as you walk through a door, such as a raised step or threshold.  Trim any bushes or trees on the path to your home.  Use bright outdoor lighting.  Clear any walking paths of anything that might make someone trip, such as rocks or tools.  Regularly check to see if handrails are loose or broken. Make sure that both sides of any steps have handrails.  Any raised decks and porches should have guardrails on the edges.  Have any leaves, snow, or ice cleared regularly.  Use sand or salt on walking paths during winter.  Clean up any spills in your garage right away. This includes oil or grease spills. What can I do in the bathroom?  Use night lights.  Install grab bars by the toilet and in the tub and shower. Do not use towel bars as grab bars.  Use non-skid mats or decals in the tub or shower.  If you need to sit down in the shower, use a plastic, non-slip stool.  Keep the floor dry. Clean up any water that spills on the floor as soon as it happens.  Remove soap buildup in the tub or shower  regularly.  Attach bath mats securely with double-sided non-slip rug tape.  Do not have throw rugs and other things on the floor that can make you trip. What can I do in the bedroom?  Use night lights.  Make sure that you have a light by your bed that is easy to reach.  Do not use any sheets or blankets that are too big for your bed. They should not hang down onto the floor.  Have a firm chair that has side arms. You can use this for support while you get dressed.  Do not have throw rugs and other things on the floor that can make you trip. What can I do in the kitchen?  Clean up any spills right away.  Avoid walking on wet floors.  Keep items that you use a lot in easy-to-reach places.  If you need to reach something above you, use a strong step stool that has a grab bar.  Keep electrical cords out of the way.  Do not use floor polish or wax that makes floors slippery. If you must use wax, use non-skid floor wax.  Do not have throw rugs and other  things on the floor that can make you trip. What can I do with my stairs?  Do not leave any items on the stairs.  Make sure that there are handrails on both sides of the stairs and use them. Fix handrails that are broken or loose. Make sure that handrails are as long as the stairways.  Check any carpeting to make sure that it is firmly attached to the stairs. Fix any carpet that is loose or worn.  Avoid having throw rugs at the top or bottom of the stairs. If you do have throw rugs, attach them to the floor with carpet tape.  Make sure that you have a light switch at the top of the stairs and the bottom of the stairs. If you do not have them, ask someone to add them for you. What else can I do to help prevent falls?  Wear shoes that:  Do not have high heels.  Have rubber bottoms.  Are comfortable and fit you well.  Are closed at the toe. Do not wear sandals.  If you use a stepladder:  Make sure that it is fully  opened. Do not climb a closed stepladder.  Make sure that both sides of the stepladder are locked into place.  Ask someone to hold it for you, if possible.  Clearly mark and make sure that you can see:  Any grab bars or handrails.  First and last steps.  Where the edge of each step is.  Use tools that help you move around (mobility aids) if they are needed. These include:  Canes.  Walkers.  Scooters.  Crutches.  Turn on the lights when you go into a dark area. Replace any light bulbs as soon as they burn out.  Set up your furniture so you have a clear path. Avoid moving your furniture around.  If any of your floors are uneven, fix them.  If there are any pets around you, be aware of where they are.  Review your medicines with your doctor. Some medicines can make you feel dizzy. This can increase your chance of falling. Ask your doctor what other things that you can do to help prevent falls. This information is not intended to replace advice given to you by your health care provider. Make sure you discuss any questions you have with your health care provider. Document Released: 09/17/2009 Document Revised: 04/28/2016 Document Reviewed: 12/26/2014 Elsevier Interactive Patient Education  2017 [employer].

## 2020-02-19 ENCOUNTER — Ambulatory Visit: Payer: Self-pay | Admitting: *Deleted

## 2020-02-19 NOTE — Telephone Encounter (Signed)
She fell at the 1st wk of March and bruised her right inner thigh.   She is concerned and wants to have it checked.   She also hit her head but "the 2 knots are almost gone"   No LOC.  See triage notes.  I made her an appt with Kathleene Hazel for 02/20/2020 at 8:00 AM.   Reason for Disposition . Large swelling or bruise > 2 inches (5 cm)  Answer Assessment - Initial Assessment Questions 1. MECHANISM: "How did the injury happen?" (e.g., twisting injury, direct blow)      I slipped and fell in my bedroom.   I have 2 bumps on my thigh.     I have 1 bruises on my inner right thigh that feels like a large marble.   No history of blood clots. 2. ONSET: "When did the injury happen?" (Minutes or hours ago)      March 2 I fell 3. LOCATION: "Where is the injury located?"      Inside of right inner thigh 4. APPEARANCE of INJURY: "What does the injury look like?"  (e.g., deformity of leg)     It's a hard knot with a bruise 5. SEVERITY: "Can you put weight on that leg?" "Can you walk?"      Yes but it hurts. 6. SIZE: For cuts, bruises, or swelling, ask: "How large is it?" (e.g., inches or centimeters)      No skin cuts 7. PAIN: "Is there pain?" If so, ask: "How bad is the pain?"  (Scale 1-10; or mild, moderate, severe)     It's sore to the touch.  My right leg is slightly swollen.   No numbness/tingling in right leg. 8. TETANUS: For any breaks in the skin, ask: "When was the last tetanus booster?"     No 9. OTHER SYMPTOMS: "Do you have any other symptoms?"      No 10. PREGNANCY: "Is there any chance you are pregnant?" "When was your last menstrual period?"       N/A due to age  Protocols used: [place]

## 2020-02-20 ENCOUNTER — Ambulatory Visit (INDEPENDENT_AMBULATORY_CARE_PROVIDER_SITE_OTHER): Payer: Medicare Other | Admitting: Physician Assistant

## 2020-02-20 ENCOUNTER — Encounter: Payer: Self-pay | Admitting: Physician Assistant

## 2020-02-20 ENCOUNTER — Other Ambulatory Visit: Payer: Self-pay

## 2020-02-20 VITALS — BP 116/76 | HR 78 | Temp 98.2°F

## 2020-02-20 DIAGNOSIS — T148XXA Other injury of unspecified body region, initial encounter: Secondary | ICD-10-CM

## 2020-02-20 DIAGNOSIS — W19XXXS Unspecified fall, sequela: Secondary | ICD-10-CM

## 2020-02-20 NOTE — Patient Instructions (Signed)
Contusion A contusion is a deep bruise. Contusions are the result of a blunt injury to tissues and muscle fibers under the skin. The injury causes bleeding under the skin. The skin overlying the contusion may turn blue, purple, or yellow. Minor injuries will give you a painless contusion, but more severe injuries cause contusions that may stay painful and swollen for a few weeks. Follow these instructions at home: Pay attention to any changes in your symptoms. Let your health care provider know about them. Take these actions to relieve your pain. Managing pain, stiffness, and swelling   Use resting, icing, applying pressure (compression), and raising (elevating) the injured area. This is often called the RICE strategy. ? Rest the injured area. Return to your normal activities as told by your health care provider. Ask your health care provider what activities are safe for you. ? If directed, put ice on the injured area:  Put ice in a plastic bag.  Place a towel between your skin and the bag.  Leave the ice on for 20 minutes, 2-3 times per day. ? If directed, apply light compression to the injured area using an elastic bandage. Make sure the bandage is not wrapped too tightly. Remove and reapply the bandage as directed by your health care provider. ? If possible, raise (elevate) the injured area above the level of your heart while you are sitting or lying down. General instructions  Take over-the-counter and prescription medicines only as told by your health care provider.  Keep all follow-up visits as told by your health care provider. This is important. Contact a health care provider if:  Your symptoms do not improve after several days of treatment.  Your symptoms get worse.  You have difficulty moving the injured area. Get help right away if:  You have severe pain.  You have numbness in a hand or foot.  Your hand or foot turns pale or cold. Summary  A contusion is a deep  bruise.  Contusions are the result of a blunt injury to tissues and muscle fibers under the skin.  It is treated with rest, ice, compression, and elevation. You may be given over-the-counter medicines for pain.  Contact a health care provider if your symptoms do not improve, or get worse.  Get help right away if you have severe pain, have numbness, or the area turns pale or cold. This information is not intended to replace advice given to you by your health care provider. Make sure you discuss any questions you have with your health care provider. Document Revised: 07/12/2018 Document Reviewed: 07/12/2018 Elsevier Patient Education  2020 Elsevier Inc[employer]   

## 2020-02-20 NOTE — Progress Notes (Addendum)
Patient: Julia Salazar Female    DOB: 1929/03/29   86 y.o.   MRN: [id number] Visit Date: 02/20/2020  Today's Provider: Trinna Post, PA-C   Chief Complaint  Patient presents with  . Fall   Subjective:     Fall The accident occurred more than 1 week ago. The fall occurred while walking. She landed on hard floor. The point of impact was the head and left elbow. The pain is present in the head and right upper leg. The symptoms are aggravated by standing, pressure on injury, use of injured limb, movement and ambulation. Pertinent negatives include no abdominal pain, headaches, loss of consciousness, numbness or tingling. She has tried nothing for the symptoms. The treatment provided no relief.  Patient states she did not go to the ER after her fall on 02/04/2020. She did hit her head and had 2 lumps. She was putting on clothes and slipped, she hit her head on the chair. She now has a lump on her right upper thigh that showed up 2 days after fall. It looked like a bruise when it first showed up but now feels like a knot. Reports this area is painful when she walks and when she is laying in bed. The knot got worse yesterday in terms of pain but has not gotten any larger. There is no heat to the area. She is concerned this may be a blood clot. She denies being bed bound, long car rides, history of cancer. She denies SOB and chest pain.   Allergies  Allergen Reactions  . Lovastatin   . Sertraline Hcl   . Etodolac Rash     Current Outpatient Medications:  .  amLODipine (NORVASC) 10 MG tablet, TAKE 1 TABLET DAILY, Disp: 90 tablet, Rfl: 3 .  aspirin 81 MG tablet, Take 1 tablet by mouth daily., Disp: , Rfl:  .  brimonidine-timolol (COMBIGAN) 0.2-0.5 % ophthalmic solution, Place 1 drop into both eyes every 12 (twelve) hours., Disp: , Rfl:  .  Calcium Carb-Cholecalciferol (CALCIUM 600 + D) 600-200 MG-UNIT TABS, Take 1 tablet by mouth daily., Disp: , Rfl:  .  candesartan (ATACAND) 8 MG  tablet, Take one tablet (Patient taking differently: Take 8 mg by mouth daily. ), Disp: 90 tablet, Rfl: 4 .  carisoprodol (SOMA) 350 MG tablet, Take 1 tablet (350 mg total) by mouth every 6 (six) hours., Disp: 180 tablet, Rfl: 4 .  cholestyramine light (PREVALITE) 4 GM/DOSE powder, MIX 2 GRAMS WITH FLUIDS AND DRINK EVERY 2-3 DAYS AS NEEDED FOR LOOSE BOWELS (Patient taking differently: MIX 2 GRAMS WITH FLUIDS AND DRINK EVERY 2-3 DAYS AS NEEDED FOR LOOSE BOWELS), Disp: 231 g, Rfl:  .  diphenhydrAMINE (BENADRYL ALLERGY) 25 mg capsule, Take 25 mg by mouth at bedtime. , Disp: , Rfl:  .  diphenoxylate-atropine (LOMOTIL) 2.5-0.025 MG tablet, Take 1 tablet by mouth 4 (four) times daily as needed., Disp: 180 tablet, Rfl: 1 .  FLUoxetine (PROZAC) 20 MG capsule, TAKE 1 CAPSULE DAILY, Disp: 90 capsule, Rfl: 4 .  furosemide (LASIX) 40 MG tablet, TAKE 1 TABLET DAILY, Disp: 90 tablet, Rfl: 3 .  hydrochlorothiazide (HYDRODIURIL) 12.5 MG tablet, TAKE 1 TABLET DAILY, Disp: 90 tablet, Rfl: 4 .  HYDROcodone-acetaminophen (NORCO) 7.5-325 MG tablet, Take 1 tablet by mouth 4 (four) times daily as needed for moderate pain., Disp: 360 tablet, Rfl: 0 .  HYDROcodone-acetaminophen (NORCO/VICODIN) 5-325 MG tablet, Take 1 tablet by mouth every 6 (six) hours as needed for  moderate pain., Disp: 360 tablet, Rfl: 0 .  meloxicam (MOBIC) 15 MG tablet, TAKE 1 TABLET DAILY AS NEEDED, Disp: 90 tablet, Rfl: 3 .  Multiple Vitamins-Minerals (MULTIVITAMIN ADULT PO), Take 1 tablet by mouth daily., Disp: , Rfl:  .  omega-3 acid ethyl esters (LOVAZA) 1 g capsule, Take 4 capsules (4 g total) by mouth daily., Disp: 360 capsule, Rfl: 4 .  potassium chloride SA (K-DUR) 20 MEQ tablet, TAKE 1 TABLET DAILY, Disp: 90 tablet, Rfl: 3 .  pravastatin (PRAVACHOL) 40 MG tablet, TAKE 1 TABLET DAILY, Disp: 90 tablet, Rfl: 3 .  prednisoLONE acetate (PRED FORTE) 1 % ophthalmic suspension, Place 1 drop into the left eye daily. , Disp: , Rfl:   Review of Systems    Gastrointestinal: Negative for abdominal pain.  Neurological: Negative for dizziness, tingling, loss of consciousness, light-headedness, numbness and headaches.    Social History   Tobacco Use  . Smoking status: Never Smoker  . Smokeless tobacco: Never Used  Substance Use Topics  . Alcohol use: No    Alcohol/week: 0.0 standard drinks      Objective:   BP 116/76 (BP Location: Left Arm, Patient Position: Sitting, Cuff Size: Normal)   Pulse 78   Temp 98.2 F (36.8 C) (Temporal)   SpO2 95%  Vitals:   02/20/20 0821  BP: 116/76  Pulse: 78  Temp: 98.2 F (36.8 C)  TempSrc: Temporal  SpO2: 95%  There is no height or weight on file to calculate BMI.   Physical Exam Constitutional:      Appearance: Normal appearance.  Cardiovascular:     Rate and Rhythm: Normal rate.     Comments: Lower extremities are warm and well perfused.  Pulmonary:     Effort: Pulmonary effort is normal.  Musculoskeletal:        General: Tenderness present. No swelling.     Right lower leg: No swelling. No edema.     Left lower leg: No swelling. No edema.       Legs:     Comments: Bilateral lower extremities are not edematous and comparable in size.  Neurological:     Mental Status: She is alert.      No results found for any visits on 02/20/20.     Assessment & Plan    1. Fall, sequela  Suspect posttraumatic fat necrosis or hematoma related to her fall as there is superficial palpable lump in the area of ecchymoses that emerged a couple of days after her fall. The remainder of her leg is not edematous, red, or painful and is comparable to her other leg. I am always more than happy to get her an ultrasound to check, though patient declines at this point.   2. Bruise  The entirety of the information documented in the History of Present Illness, Review of Systems and Physical Exam were personally obtained by me. Portions of this information were initially documented by [facility] and  reviewed by me for thoroughness and accuracy.   F/u PRN      Trinna Post, PA-C  [facility]

## 2020-02-21 ENCOUNTER — Encounter: Payer: Medicare Other | Admitting: Family Medicine

## 2020-02-21 ENCOUNTER — Ambulatory Visit: Payer: Medicare Other

## 2020-03-10 ENCOUNTER — Other Ambulatory Visit: Payer: Self-pay | Admitting: Family Medicine

## 2020-03-11 MED ORDER — HYDROCODONE-ACETAMINOPHEN 5-325 MG PO TABS: 1.0000 | ORAL_TABLET | Freq: Four times a day (QID) | ORAL | 0 refills | Status: DC | PRN

## 2020-03-18 ENCOUNTER — Encounter: Payer: Self-pay | Admitting: Family Medicine

## 2020-03-20 ENCOUNTER — Other Ambulatory Visit: Payer: Self-pay | Admitting: Family Medicine

## 2020-03-20 MED ORDER — CARISOPRODOL 350 MG PO TABS: 350.0000 mg | ORAL_TABLET | Freq: Four times a day (QID) | ORAL | 4 refills | Status: DC

## 2020-03-20 NOTE — Telephone Encounter (Signed)
[facility] faxed refill request for the following medications:  carisoprodol (SOMA) 350 MG tablet [id number] DISCONTINUED    Please advise.  Thanks, [employer]

## 2020-03-24 ENCOUNTER — Other Ambulatory Visit: Payer: Self-pay | Admitting: Family Medicine

## 2020-03-24 DIAGNOSIS — M1712 Unilateral primary osteoarthritis, left knee: Secondary | ICD-10-CM

## 2020-03-24 MED ORDER — HYDROCODONE-ACETAMINOPHEN 7.5-325 MG PO TABS: 1.0000 | ORAL_TABLET | Freq: Four times a day (QID) | ORAL | 0 refills | Status: DC | PRN

## 2020-04-06 ENCOUNTER — Other Ambulatory Visit: Payer: Self-pay | Admitting: Family Medicine

## 2020-04-06 MED ORDER — HYDROCHLOROTHIAZIDE 12.5 MG PO TABS: 12.5000 mg | ORAL_TABLET | Freq: Every day | ORAL | 4 refills | Status: DC

## 2020-04-06 NOTE — Telephone Encounter (Signed)
I called and spoke with patient's daughter Narda Rutherford. Narda Rutherford states that [facility] is giving them a hard time about why she is on 2 different doses of Hydrocone. Narda Rutherford says that [facility] pharmacy supervisor told her that they would be faxing our office something today asking about why she is on 2 different doses of Hydrocodone. Will check with medical records to see if any faxes came through from pharmacy.

## 2020-04-06 NOTE — Telephone Encounter (Signed)
[facility] faxed refill request for the following medications:  hydrochlorothiazide (HYDRODIURIL) 12.5 MG tablet   Please advise.

## 2020-04-06 NOTE — Telephone Encounter (Signed)
Form was received last week and faxed to insurance on 04/01/2020. I called insurance and verified that all issues with prescription dose clarification were resolved. Pharmacist Lattie Haw confirmed that they ([facility]) received the fax and all issues were resolved. I called and notified patients daughter Narda Rutherford.

## 2020-04-06 NOTE — Telephone Encounter (Signed)
Pt's daughter Narda Rutherford called this morning asking about moms prescription for the Hydrocodone.  She said that [facility] questions her taking the two different strengthens that Dr. Caryn Section prescribed.    CB# [phone]

## 2020-05-13 ENCOUNTER — Other Ambulatory Visit: Payer: Self-pay

## 2020-05-13 ENCOUNTER — Encounter: Payer: Self-pay | Admitting: Family Medicine

## 2020-05-13 ENCOUNTER — Ambulatory Visit (INDEPENDENT_AMBULATORY_CARE_PROVIDER_SITE_OTHER): Payer: Medicare Other | Admitting: Family Medicine

## 2020-05-13 VITALS — BP 110/52 | HR 78 | Temp 97.1°F | Resp 16 | Wt 117.0 lb

## 2020-05-13 DIAGNOSIS — R609 Edema, unspecified: Secondary | ICD-10-CM

## 2020-05-13 DIAGNOSIS — I10 Essential (primary) hypertension: Secondary | ICD-10-CM | POA: Diagnosis not present

## 2020-05-13 NOTE — Progress Notes (Signed)
Established patient visit   Patient: Julia Salazar   DOB: 1929/01/09   86 y.o. Female  MRN: [id number] Visit Date: 05/13/2020  Today's healthcare provider: Lelon Huh, MD   Chief Complaint  Patient presents with  . Hypertension   Subjective    HPI Hypertension, follow-up  BP Readings from Last 3 Encounters:  05/13/20 (!) 110/52  02/20/20 116/76  02/18/20 110/66   Wt Readings from Last 3 Encounters:  05/13/20 117 lb (53.1 kg)  02/04/20 120 lb (54.4 kg)  10/02/19 122 lb (55.3 kg)     She was last seen for hypertension 3 months ago.  BP at that visit was 146/80. Management since that visit includes refilling Candesartan (patient had been out of medication). Patient was advised to follow up in 3 months to recheck blood pressure and renal panel.  She reports good compliance with treatment. She is still having swelling in both feet. She is not having side effects.  She is following a Regular diet. She is exercising. She does not smoke.  Use of agents associated with hypertension: NSAIDS.   Outside blood pressures are 115/80. Symptoms: No chest pain No chest pressure  No palpitations No syncope  No dyspnea No orthopnea  No paroxysmal nocturnal dyspnea Yes lower extremity edema   Pertinent labs: Lab Results  Component Value Date   CHOL 182 10/02/2019   HDL 29 (L) 10/02/2019   LDLCALC 118 (H) 10/02/2019   TRIG 199 (H) 10/02/2019   CHOLHDL 6.3 (H) 10/02/2019   Lab Results  Component Value Date   NA 138 10/02/2019   K 4.6 10/02/2019   CREATININE 1.43 (H) 10/02/2019   GFRNONAA 33 (L) 10/02/2019   GFRAA 38 (L) 10/02/2019   GLUCOSE 81 10/02/2019     The ASCVD Risk score (Goff DC Jr., et al., 2013) failed to calculate for the following reasons:   The 2013 ASCVD risk score is only valid for ages 35 to 74   ---------------------------------------------------------------------------------------------------    Medications: Outpatient Medications Prior to  Visit  Medication Sig  . amLODipine (NORVASC) 10 MG tablet TAKE 1 TABLET DAILY  . aspirin 81 MG tablet Take 1 tablet by mouth daily.  . brimonidine-timolol (COMBIGAN) 0.2-0.5 % ophthalmic solution Place 1 drop into both eyes every 12 (twelve) hours.  . Calcium Carb-Cholecalciferol (CALCIUM 600 + D) 600-200 MG-UNIT TABS Take 1 tablet by mouth daily.  . candesartan (ATACAND) 8 MG tablet Take one tablet (Patient taking differently: Take 8 mg by mouth daily. )  . carisoprodol (SOMA) 350 MG tablet Take 1 tablet (350 mg total) by mouth every 6 (six) hours.  . cholestyramine light (PREVALITE) 4 GM/DOSE powder MIX 2 GRAMS WITH FLUIDS AND DRINK EVERY 2-3 DAYS AS NEEDED FOR LOOSE BOWELS (Patient taking differently: MIX 2 GRAMS WITH FLUIDS AND DRINK EVERY 2-3 DAYS AS NEEDED FOR LOOSE BOWELS)  . diphenhydrAMINE (BENADRYL ALLERGY) 25 mg capsule Take 25 mg by mouth at bedtime.   . diphenoxylate-atropine (LOMOTIL) 2.5-0.025 MG tablet Take 1 tablet by mouth 4 (four) times daily as needed.  Marland Kitchen FLUoxetine (PROZAC) 20 MG capsule TAKE 1 CAPSULE DAILY  . furosemide (LASIX) 40 MG tablet TAKE 1 TABLET DAILY  . hydrochlorothiazide (HYDRODIURIL) 12.5 MG tablet Take 1 tablet (12.5 mg total) by mouth daily.  Marland Kitchen HYDROcodone-acetaminophen (NORCO) 7.5-325 MG tablet Take 1 tablet by mouth 4 (four) times daily as needed for moderate pain.  Marland Kitchen HYDROcodone-acetaminophen (NORCO/VICODIN) 5-325 MG tablet Take 1 tablet by mouth every 6 (six)  hours as needed for moderate pain.  . meloxicam (MOBIC) 15 MG tablet TAKE 1 TABLET DAILY AS NEEDED  . Multiple Vitamins-Minerals (MULTIVITAMIN ADULT PO) Take 1 tablet by mouth daily.  Marland Kitchen omega-3 acid ethyl esters (LOVAZA) 1 g capsule Take 4 capsules (4 g total) by mouth daily.  . potassium chloride SA (K-DUR) 20 MEQ tablet TAKE 1 TABLET DAILY  . pravastatin (PRAVACHOL) 40 MG tablet TAKE 1 TABLET DAILY  . prednisoLONE acetate (PRED FORTE) 1 % ophthalmic suspension Place 1 drop into the left eye  daily.    No facility-administered medications prior to visit.    Review of Systems  Constitutional: Negative for appetite change, chills, fatigue and fever.  Respiratory: Negative for chest tightness and shortness of breath.   Cardiovascular: Negative for chest pain and palpitations.  Gastrointestinal: Negative for abdominal pain, nausea and vomiting.  Musculoskeletal: Positive for joint swelling (in both feet).  Neurological: Negative for dizziness and weakness.     Objective    BP (!) 110/52 (BP Location: Right Arm, Patient Position: Sitting, Cuff Size: Normal)   Pulse 78   Temp (!) 97.1 F (36.2 C) (Temporal)   Resp 16   Wt 117 lb (53.1 kg)   SpO2 96% Comment: room air  BMI 23.63 kg/m   Physical Exam  General appearance: Well developed, well nourished female, cooperative and in no acute distress Head: Normocephalic, without obvious abnormality, atraumatic. Mod cerumen right ear canal, none in left Respiratory: Respirations even and unlabored, normal respiratory rate Extremities: trace bipedal edemia  No results found for any visits on 05/13/20.  Assessment & Plan     1. Essential (primary) hypertension Very well controlled since restarting candesartan. Consider d/c potassium supplement after reviewing labs.  - Renal Function Panel  2. Edema, unspecified type Is likely exacerbate by amlodipine. She is currently able to keep it under control with leg elevation as needed. Counseled that her BP is well enough controlled we could consider reducing amlodipine if it worsens.    Future Appointments  Date Time Provider [place]  10/07/2020 10:00 AM Birdie Sons, MD [facility]  02/22/2021 10:00 AM [facility]-NURSE HEALTH ADVISOR [facility]         The entirety of the information documented in the History of Present Illness, Review of Systems and Physical Exam were personally obtained by me. Portions of this information were initially documented by the CMA and  reviewed by me for thoroughness and accuracy.      Lelon Huh, MD  [facility] [phone] (phone) [phone] (fax)  [place]

## 2020-05-14 ENCOUNTER — Other Ambulatory Visit: Payer: Self-pay | Admitting: Family Medicine

## 2020-05-14 LAB — RENAL FUNCTION PANEL
Albumin: 4.1 g/dL (ref 3.6–4.6)
BUN/Creatinine Ratio: 26 (ref 12–28)
BUN: 34 mg/dL — ABNORMAL HIGH (ref 8–27)
CO2: 22 mmol/L (ref 20–29)
Calcium: 10.2 mg/dL (ref 8.7–10.3)
Chloride: 104 mmol/L (ref 96–106)
Creatinine, Ser: 1.3 mg/dL — ABNORMAL HIGH (ref 0.57–1.00)
GFR calc Af Amer: 42 mL/min/{1.73_m2} — ABNORMAL LOW (ref >59)
GFR calc non Af Amer: 36 mL/min/{1.73_m2} — ABNORMAL LOW (ref >59)
Glucose: 84 mg/dL (ref 65–99)
Phosphorus: 4 mg/dL (ref 3.0–4.3)
Potassium: 5 mmol/L (ref 3.5–5.2)
Sodium: 141 mmol/L (ref 134–144)

## 2020-05-14 MED ORDER — MELOXICAM 15 MG PO TABS: 15.0000 mg | ORAL_TABLET | Freq: Every day | ORAL | 3 refills | Status: DC | PRN

## 2020-05-14 NOTE — Telephone Encounter (Signed)
[facility] faxed refill request for the following medications:  meloxicam (MOBIC) 15 MG tablet   Please advise.  Thanks, [employer]

## 2020-05-19 ENCOUNTER — Other Ambulatory Visit: Payer: Self-pay | Admitting: Family Medicine

## 2020-05-19 DIAGNOSIS — M1712 Unilateral primary osteoarthritis, left knee: Secondary | ICD-10-CM

## 2020-05-20 MED ORDER — HYDROCODONE-ACETAMINOPHEN 7.5-325 MG PO TABS: 1.0000 | ORAL_TABLET | Freq: Four times a day (QID) | ORAL | 0 refills | Status: DC | PRN

## 2020-06-15 ENCOUNTER — Other Ambulatory Visit: Payer: Self-pay | Admitting: Family Medicine

## 2020-06-15 MED ORDER — HYDROCODONE-ACETAMINOPHEN 5-325 MG PO TABS: 1.0000 | ORAL_TABLET | Freq: Four times a day (QID) | ORAL | 0 refills | Status: DC | PRN

## 2020-07-15 ENCOUNTER — Other Ambulatory Visit: Payer: Self-pay | Admitting: Family Medicine

## 2020-07-15 DIAGNOSIS — M1712 Unilateral primary osteoarthritis, left knee: Secondary | ICD-10-CM

## 2020-07-16 MED ORDER — HYDROCODONE-ACETAMINOPHEN 7.5-325 MG PO TABS: 1.0000 | ORAL_TABLET | Freq: Four times a day (QID) | ORAL | 0 refills | Status: DC | PRN

## 2020-07-16 NOTE — Telephone Encounter (Signed)
Seems patient is on 2 different doses of Vicodin and just received 90 day supply of 5mg  tabs 1 month ago.  I feel it is an overdose risk for her to have 2 different doses and such large quantities.  I will not refill at this time, but will forward to PCP so he can decide if he would like to.

## 2020-07-16 NOTE — Telephone Encounter (Signed)
Patient received # 160 of 7.5 mg on 05/20/20/ and #360 of 5 mg on 06/15/20.  They are requesting 90 day supply today.

## 2020-08-03 ENCOUNTER — Other Ambulatory Visit: Payer: Self-pay | Admitting: Family Medicine

## 2020-08-03 NOTE — Telephone Encounter (Signed)
Requested Prescriptions  Pending Prescriptions Disp Refills  . amLODipine (NORVASC) 10 MG tablet [Pharmacy Med Name: AMLODIPINE TAB 10MG ] 90 tablet 0    Sig: TAKE 1 TABLET DAILY     Cardiovascular:  Calcium Channel Blockers Passed - 08/03/2020  7:20 PM      Passed - Last BP in normal range    BP Readings from Last 1 Encounters:  05/13/20 (!) 110/52         Passed - Valid encounter within last 6 months    Recent Outpatient Visits          2 months ago Essential (primary) hypertension   [facility] Birdie Sons, MD   5 months ago Fall, sequela   Orin, Cullen, PA-C   6 months ago Essential (primary) hypertension   [facility] Birdie Sons, MD   10 months ago Annual physical exam   [facility] Birdie Sons, MD   2 years ago Hypothyroidism, unspecified type   [facility] Birdie Sons, MD      Future Appointments            In 2 months Fisher, Kirstie Peri, MD [facility], [place]

## 2020-08-12 ENCOUNTER — Other Ambulatory Visit: Payer: Self-pay | Admitting: Family Medicine

## 2020-08-20 ENCOUNTER — Telehealth: Payer: Self-pay | Admitting: Family Medicine

## 2020-08-20 ENCOUNTER — Other Ambulatory Visit: Payer: Self-pay | Admitting: Family Medicine

## 2020-08-20 NOTE — Telephone Encounter (Signed)
Pt's daughter called in to request a refill for candesartan (ATACAND) 8 MG tablet. Daughter says that she was told by pharmacy that Rx is inactive on file. Pt doesn't recall a change to medication.    [id number] [place], [address]  [address], [place] [address]    Please assist.

## 2020-08-21 MED ORDER — CANDESARTAN CILEXETIL 8 MG PO TABS: ORAL_TABLET | ORAL | 4 refills | Status: DC

## 2020-09-01 ENCOUNTER — Other Ambulatory Visit: Payer: Self-pay | Admitting: Family Medicine

## 2020-09-01 DIAGNOSIS — M1712 Unilateral primary osteoarthritis, left knee: Secondary | ICD-10-CM

## 2020-09-03 MED ORDER — HYDROCODONE-ACETAMINOPHEN 7.5-325 MG PO TABS
1.0000 | ORAL_TABLET | Freq: Four times a day (QID) | ORAL | 0 refills | Status: DC | PRN
Start: 2020-09-03 — End: 2020-10-05

## 2020-09-16 ENCOUNTER — Other Ambulatory Visit: Payer: Self-pay | Admitting: Family Medicine

## 2020-09-16 DIAGNOSIS — Z1231 Encounter for screening mammogram for malignant neoplasm of breast: Secondary | ICD-10-CM

## 2020-09-17 MED ORDER — HYDROCODONE-ACETAMINOPHEN 5-325 MG PO TABS
1.0000 | ORAL_TABLET | Freq: Four times a day (QID) | ORAL | 0 refills | Status: DC | PRN
Start: 2020-09-17 — End: 2020-10-05

## 2020-09-21 ENCOUNTER — Encounter: Payer: Self-pay | Admitting: Family Medicine

## 2020-09-23 ENCOUNTER — Ambulatory Visit: Payer: Medicare Other

## 2020-09-25 ENCOUNTER — Telehealth: Payer: Self-pay | Admitting: Family Medicine

## 2020-09-25 NOTE — Telephone Encounter (Signed)
I would recommend to patient that we change to one dose of the medication. She's currently alternating between 5 and 7.5 which is actually not a high dose, but having two separate prescriptions makes the pill count look high and the pharmacists are getting strict about that lately. I would suggest changing to 7.5mg  every 4-6 hours. She still may need to change pharmacies

## 2020-09-25 NOTE — Telephone Encounter (Signed)
Patient has CPE scheduled 10-07-2020 at 10AM. Comments say she wants a booster shot. Does she need to be put on Covid vaccine schedule?

## 2020-09-28 ENCOUNTER — Encounter: Payer: Self-pay | Admitting: Family Medicine

## 2020-09-28 ENCOUNTER — Ambulatory Visit (INDEPENDENT_AMBULATORY_CARE_PROVIDER_SITE_OTHER): Payer: Medicare Other | Admitting: Family Medicine

## 2020-09-28 DIAGNOSIS — M1990 Unspecified osteoarthritis, unspecified site: Secondary | ICD-10-CM | POA: Diagnosis not present

## 2020-09-28 DIAGNOSIS — R1031 Right lower quadrant pain: Secondary | ICD-10-CM | POA: Diagnosis not present

## 2020-09-28 MED ORDER — PREDNISONE 10 MG PO TABS: ORAL_TABLET | ORAL | 0 refills | Status: DC

## 2020-09-28 NOTE — Progress Notes (Signed)
Virtual telephone visit    Virtual Visit via Telephone Note   This visit type was conducted due to national recommendations for restrictions regarding the COVID-19 Pandemic (e.g. social distancing) in an effort to limit this patient's exposure and mitigate transmission in our community. Due to her co-morbid illnesses, this patient is at least at moderate risk for complications without adequate follow up. This format is felt to be most appropriate for this patient at this time. The patient did not have access to video technology or had technical difficulties with video requiring transitioning to audio format only (telephone). Physical exam was limited to content and character of the telephone converstion.    Patient location: home Provider location: office  I discussed the limitations of evaluation and management by telemedicine and the availability of in person appointments. The patient expressed understanding and agreed to proceed.   Visit Date: 09/28/2020  Today's healthcare provider: Vernie Murders, PA   No chief complaint on file.  Subjective    HPI  The patient is an 86 year old female who presents via phone visit with complaint of right sided groin pain that radiates down her leg for 1 week.  She states it hurts so bad she can hardly walk.  She has been using Hydrocodone, heat and ice and is getting a little relief.   It is worst when she has to get up and down or when she is walking.   She does have arthritis in multiple areas but states this has not worsened any of those symptoms. She is using the pain medication 4 times a day.       Past Medical History:  Diagnosis Date  . Depression   . Diverticulosis   . History of chicken pox   . History of measles   . History of mumps   . Hyperlipidemia   . Hypertension   . Ovarian cancer ([place]) 1995   Past Surgical History:  Procedure Laterality Date  . ABDOMINAL HYSTERECTOMY     1973  . APPENDECTOMY  1986  . BACK  SURGERY  05/2012   done at [facility] in [place]  . BILATERAL SALPINGOOPHORECTOMY  1986  . CATARACT EXTRACTION  2010   right eye  . CHOLECYSTECTOMY  1998  . COLON RESECTION  1995  . CORNEAL TRANSPLANT    . DESCEMETS STRIPPING AUTOMATED ENDOTHELIAL KERATOPLASTY Left 08/30/2018   Alvie Heidelberg, MD [facility]  . OOPHORECTOMY     Social History   Tobacco Use  . Smoking status: Never Smoker  . Smokeless tobacco: Never Used  Vaping Use  . Vaping Use: Never used  Substance Use Topics  . Alcohol use: No    Alcohol/week: 0.0 standard drinks  . Drug use: No   Family Status  Relation Name Status  . Mother  Deceased at age 84       cancer  . Father  Deceased at age 38       MI  . Sister  Alive       Has a Publishing copy in 2012. Breast cancer returned 2021. Currently having radiation.   . Sister  Deceased   Allergies  Allergen Reactions  . Lovastatin   . Sertraline Hcl   . Etodolac Rash      Medications: Outpatient Medications Prior to Visit  Medication Sig  . amLODipine (NORVASC) 10 MG tablet TAKE 1 TABLET DAILY  . aspirin 81 MG tablet Take 1 tablet by mouth daily.  . brimonidine-timolol (COMBIGAN) 0.2-0.5 %  ophthalmic solution Place 1 drop into both eyes every 12 (twelve) hours.  . Calcium Carb-Cholecalciferol (CALCIUM 600 + D) 600-200 MG-UNIT TABS Take 1 tablet by mouth daily.  . candesartan (ATACAND) 8 MG tablet Take one tablet  . carisoprodol (SOMA) 350 MG tablet Take 1 tablet (350 mg total) by mouth every 6 (six) hours.  . cholestyramine light (PREVALITE) 4 GM/DOSE powder MIX 2 GRAMS WITH FLUIDS AND DRINK EVERY 2-3 DAYS AS NEEDED FOR LOOSE BOWELS (Patient taking differently: MIX 2 GRAMS WITH FLUIDS AND DRINK EVERY 2-3 DAYS AS NEEDED FOR LOOSE BOWELS)  . diphenhydrAMINE (BENADRYL ALLERGY) 25 mg capsule Take 25 mg by mouth at bedtime.   . diphenoxylate-atropine (LOMOTIL) 2.5-0.025 MG tablet Take 1 tablet by mouth 4 (four) times daily as needed.  Marland Kitchen FLUoxetine (PROZAC) 20 MG  capsule TAKE 1 CAPSULE DAILY  . furosemide (LASIX) 40 MG tablet TAKE 1 TABLET DAILY  . hydrochlorothiazide (HYDRODIURIL) 12.5 MG tablet Take 1 tablet (12.5 mg total) by mouth daily.  Marland Kitchen HYDROcodone-acetaminophen (NORCO) 7.5-325 MG tablet Take 1 tablet by mouth 4 (four) times daily as needed for moderate pain.  Marland Kitchen HYDROcodone-acetaminophen (NORCO/VICODIN) 5-325 MG tablet Take 1 tablet by mouth every 6 (six) hours as needed for moderate pain.  . meloxicam (MOBIC) 15 MG tablet Take 1 tablet (15 mg total) by mouth daily as needed.  . Multiple Vitamins-Minerals (MULTIVITAMIN ADULT PO) Take 1 tablet by mouth daily.  Marland Kitchen omega-3 acid ethyl esters (LOVAZA) 1 g capsule Take 4 capsules (4 g total) by mouth daily.  . potassium chloride SA (K-DUR) 20 MEQ tablet TAKE 1 TABLET DAILY  . pravastatin (PRAVACHOL) 40 MG tablet TAKE 1 TABLET DAILY  . prednisoLONE acetate (PRED FORTE) 1 % ophthalmic suspension Place 1 drop into the left eye daily.    No facility-administered medications prior to visit.    Review of Systems  Musculoskeletal: Positive for arthralgias (chromin and unchanged), back pain (chronic and unchanged) and myalgias.      Objective    There were no vitals taken for this visit.     Assessment & Plan     1. Right groin pain No known injury to the right hip. Some radiation of discomfort down the right leg without numbness or weakness. Only slight relief from Hydrocodone and Soma. Apply moist heat or ice packs. Will give prednisone taper and recheck with Dr. Caryn Section as planned on 10-07-20. -predniSONE (DELTASONE) 10 MG tablet; Taper down by 1 tablet by mouth daily starting at 6 day 1, 5 day 2, 4 day 3, 3 day 4, 2 day 5 and 1 day 6. Divide tablets among meals and bedtime each day.  Dispense: 21 tablet; Refill: 0  2. Arthritis Chronic pain syndrome from arthritis in back with grade 2 spondylolisthesis, L5-S1 posterior interbody fusion and mild compression of L2. Using Norco 5-325 mg QiD prn with  Soma 350 mg q 6 hrs.   No follow-ups on file.    I discussed the assessment and treatment plan with the patient. The patient was provided an opportunity to ask questions and all were answered. The patient agreed with the plan and demonstrated an understanding of the instructions.   The patient was advised to call back or seek an in-person evaluation if the symptoms worsen or if the condition fails to improve as anticipated.  I provided 23 minutes of non-face-to-face time during this encounter.  Andres Shad, PA, have reviewed all documentation for this visit. The documentation on 09/28/20 for the exam,  diagnosis, procedures, and orders are all accurate and complete.   Vernie Murders, [place] [phone] (phone) [phone] (fax)  [place]

## 2020-09-30 NOTE — Telephone Encounter (Signed)
Patient advised that we only give Covid Vaccines on Thursdays, not during an office visit.   She said she would make other arrangements to get hers. cbe

## 2020-10-05 ENCOUNTER — Other Ambulatory Visit: Payer: Self-pay | Admitting: Family Medicine

## 2020-10-05 MED ORDER — HYDROCODONE-ACETAMINOPHEN 5-325 MG PO TABS
1.0000 | ORAL_TABLET | Freq: Four times a day (QID) | ORAL | 0 refills | Status: DC | PRN
Start: 2020-10-05 — End: 2021-01-04

## 2020-10-07 ENCOUNTER — Other Ambulatory Visit: Payer: Self-pay

## 2020-10-07 ENCOUNTER — Encounter: Payer: Self-pay | Admitting: Family Medicine

## 2020-10-07 ENCOUNTER — Ambulatory Visit (INDEPENDENT_AMBULATORY_CARE_PROVIDER_SITE_OTHER): Payer: Medicare Other | Admitting: Family Medicine

## 2020-10-07 VITALS — BP 102/56 | HR 69 | Temp 98.3°F | Resp 20 | Wt 117.0 lb

## 2020-10-07 DIAGNOSIS — I10 Essential (primary) hypertension: Secondary | ICD-10-CM

## 2020-10-07 DIAGNOSIS — Z0000 Encounter for general adult medical examination without abnormal findings: Secondary | ICD-10-CM | POA: Diagnosis not present

## 2020-10-07 DIAGNOSIS — R1031 Right lower quadrant pain: Secondary | ICD-10-CM

## 2020-10-07 DIAGNOSIS — M8580 Other specified disorders of bone density and structure, unspecified site: Secondary | ICD-10-CM | POA: Diagnosis not present

## 2020-10-07 DIAGNOSIS — M25559 Pain in unspecified hip: Secondary | ICD-10-CM | POA: Diagnosis not present

## 2020-10-07 DIAGNOSIS — E039 Hypothyroidism, unspecified: Secondary | ICD-10-CM

## 2020-10-07 DIAGNOSIS — F32A Depression, unspecified: Secondary | ICD-10-CM

## 2020-10-07 DIAGNOSIS — E559 Vitamin D deficiency, unspecified: Secondary | ICD-10-CM

## 2020-10-07 DIAGNOSIS — E2839 Other primary ovarian failure: Secondary | ICD-10-CM

## 2020-10-07 DIAGNOSIS — E782 Mixed hyperlipidemia: Secondary | ICD-10-CM

## 2020-10-07 DIAGNOSIS — G8929 Other chronic pain: Secondary | ICD-10-CM

## 2020-10-07 DIAGNOSIS — M545 Low back pain, unspecified: Secondary | ICD-10-CM

## 2020-10-07 DIAGNOSIS — M1712 Unilateral primary osteoarthritis, left knee: Secondary | ICD-10-CM

## 2020-10-07 MED ORDER — PREDNISONE 10 MG PO TABS: ORAL_TABLET | ORAL | 0 refills | Status: AC

## 2020-10-07 MED ORDER — AMLODIPINE BESYLATE 5 MG PO TABS: 5.0000 mg | ORAL_TABLET | Freq: Every day | ORAL | 1 refills | Status: DC

## 2020-10-07 NOTE — Progress Notes (Signed)
Complete physical exam   Patient: Julia Salazar   DOB: [birth year]   86 y.o. Female  MRN: [id number] Visit Date: 10/07/2020  Today's healthcare provider: Lelon Huh, MD   Chief Complaint  Patient presents with  . Annual Exam   Subjective    Julia Salazar is a 86 y.o. female who presents today for a complete physical exam.  She reports consuming a general diet. The patient does not participate in regular exercise at present. She generally feels fairly well. She reports sleeping fairly well. She does have additional problems to discuss today.    Patient reports that she was seen by Vernie Murders, PA on 09/28/2020 due to groin pain. She was treated with a prednisone taper, and reports that she is feeling much better. She reports that the pain is still there, but has not completely resolved.   She also mentions that she has had another fall since her last visit. She reports that this was about 4 weeks ago. She hit the back of her head, but she was did not go unconscious.  She feels off balance when she walks. She uses a walker at home.   She continues to have chronic daily back pain limiting her activity. She reports that she takes one hydrocodone/apap tablet four times every day. She had been alternating 7.5 with 5mg , but lately has been having to take 7.5mg  tablets most of the time.   Past Medical History:  Diagnosis Date  . Depression   . Diverticulosis   . History of chicken pox   . History of measles   . History of mumps   . Hyperlipidemia   . Hypertension   . Ovarian cancer ([place]) 1995   Past Surgical History:  Procedure Laterality Date  . ABDOMINAL HYSTERECTOMY     1973  . APPENDECTOMY  1986  . BACK SURGERY  05/2012   done at [facility] in [place]  . BILATERAL SALPINGOOPHORECTOMY  1986  . CATARACT EXTRACTION  2010   right eye  . CHOLECYSTECTOMY  1998  . COLON RESECTION  1995  . CORNEAL TRANSPLANT    . DESCEMETS STRIPPING AUTOMATED ENDOTHELIAL  KERATOPLASTY Left 08/30/2018   Alvie Heidelberg, MD [facility]  . OOPHORECTOMY     Social History   Socioeconomic History  . Marital status: Widowed    Spouse name: Not on file  . Number of children: 3  . Years of education: LPN  . Highest education level: Associate degree: occupational, Hotel manager, or vocational program  Occupational History  . Occupation: retired  Tobacco Use  . Smoking status: Never Smoker  . Smokeless tobacco: Never Used  Vaping Use  . Vaping Use: Never used  Substance and Sexual Activity  . Alcohol use: No    Alcohol/week: 0.0 standard drinks  . Drug use: No  . Sexual activity: Not on file  Other Topics Concern  . Not on file  Social History Narrative  . Not on file   Social Determinants of Health   Financial Resource Strain: Low Risk   . Difficulty of Paying Living Expenses: Not hard at all  Food Insecurity: No Food Insecurity  . Worried About Charity fundraiser in the Last Year: Never true  . Ran Out of Food in the Last Year: Never true  Transportation Needs: No Transportation Needs  . Lack of Transportation (Medical): No  . Lack of Transportation (Non-Medical): No  Physical Activity: Inactive  . Days of Exercise per Week: 0  days  . Minutes of Exercise per Session: 0 min  Stress: No Stress Concern Present  . Feeling of Stress : Not at all  Social Connections: Moderately Isolated  . Frequency of Communication with Friends and Family: More than three times a week  . Frequency of Social Gatherings with Friends and Family: More than three times a week  . Attends Religious Services: More than 4 times per year  . Active Member of Clubs or Organizations: No  . Attends Archivist Meetings: Never  . Marital Status: Widowed  Intimate Partner Violence: Not At Risk  . Fear of Current or Ex-Partner: No  . Emotionally Abused: No  . Physically Abused: No  . Sexually Abused: No   Family Status  Relation Name Status  . Mother  Deceased at age 41        cancer  . Father  Deceased at age 57       MI  . Sister  Alive       Has a Publishing copy in 2012. Breast cancer returned 2021. Currently having radiation.   . Sister  Deceased   Family History  Problem Relation Age of Onset  . Colon cancer Mother   . Heart attack Father   . Breast cancer Sister 49  . Leukemia Sister   . Hodgkin's lymphoma Sister    Allergies  Allergen Reactions  . Lovastatin   . Sertraline Hcl   . Etodolac Rash    Patient Care Team: Birdie Sons, MD as PCP - General (Family Medicine) Oneta Rack, MD (Dermatology) Lorelee Cover., MD as Consulting Physician (Ophthalmology) Conception Chancy, DDS as Referring Physician (Dentistry)   Medications: Outpatient Medications Prior to Visit  Medication Sig  . amLODipine (NORVASC) 10 MG tablet TAKE 1 TABLET DAILY  . aspirin 81 MG tablet Take 1 tablet by mouth daily.  . brimonidine-timolol (COMBIGAN) 0.2-0.5 % ophthalmic solution Place 1 drop into both eyes every 12 (twelve) hours.  . Calcium Carb-Cholecalciferol (CALCIUM 600 + D) 600-200 MG-UNIT TABS Take 1 tablet by mouth daily.  . candesartan (ATACAND) 8 MG tablet Take one tablet  . carisoprodol (SOMA) 350 MG tablet Take 1 tablet (350 mg total) by mouth every 6 (six) hours.  . cholestyramine light (PREVALITE) 4 GM/DOSE powder MIX 2 GRAMS WITH FLUIDS AND DRINK EVERY 2-3 DAYS AS NEEDED FOR LOOSE BOWELS (Patient taking differently: MIX 2 GRAMS WITH FLUIDS AND DRINK EVERY 2-3 DAYS AS NEEDED FOR LOOSE BOWELS)  . diphenhydrAMINE (BENADRYL ALLERGY) 25 mg capsule Take 25 mg by mouth at bedtime.   . diphenoxylate-atropine (LOMOTIL) 2.5-0.025 MG tablet Take 1 tablet by mouth 4 (four) times daily as needed.  Marland Kitchen FLUoxetine (PROZAC) 20 MG capsule TAKE 1 CAPSULE DAILY  . furosemide (LASIX) 40 MG tablet TAKE 1 TABLET DAILY  . hydrochlorothiazide (HYDRODIURIL) 12.5 MG tablet Take 1 tablet (12.5 mg total) by mouth daily.  Marland Kitchen HYDROcodone-acetaminophen (NORCO/VICODIN) 5-325 MG tablet  Take 1 tablet by mouth every 6 (six) hours as needed for moderate pain.  . meloxicam (MOBIC) 15 MG tablet Take 1 tablet (15 mg total) by mouth daily as needed.  . Multiple Vitamins-Minerals (MULTIVITAMIN ADULT PO) Take 1 tablet by mouth daily.  Marland Kitchen omega-3 acid ethyl esters (LOVAZA) 1 g capsule Take 4 capsules (4 g total) by mouth daily.  . pravastatin (PRAVACHOL) 40 MG tablet TAKE 1 TABLET DAILY  . prednisoLONE acetate (PRED FORTE) 1 % ophthalmic suspension Place 1 drop into the left eye daily.   Marland Kitchen  potassium chloride SA (K-DUR) 20 MEQ tablet TAKE 1 TABLET DAILY  . predniSONE (DELTASONE) 10 MG tablet Taper down by 1 tablet by mouth daily starting at 6 day 1, 5 day 2, 4 day 3, 3 day 4, 2 day 5 and 1 day 6. Divide tablets among meals and bedtime each day.   No facility-administered medications prior to visit.    Review of Systems  Constitutional: Negative.   HENT: Negative.   Eyes: Negative.   Respiratory: Negative.   Cardiovascular: Negative.   Gastrointestinal: Negative.   Endocrine: Negative.   Genitourinary: Negative.   Musculoskeletal: Positive for arthralgias, back pain, gait problem and myalgias.  Skin: Negative.   Allergic/Immunologic: Negative.   Hematological: Negative.   Psychiatric/Behavioral: Negative.       Objective    BP (!) 102/56   Pulse 69   Temp 98.3 F (36.8 C)   Resp 20   Wt 117 lb (53.1 kg)   BMI 23.63 kg/m    Physical Exam   General Appearance:    Well developed, well nourished female. Alert, cooperative, in no acute distress, appears stated age. Sitting comfortable in wheelchair.   Head:    Normocephalic, without obvious abnormality, atraumatic  Eyes:    PERRL, conjunctiva/corneas clear, EOM's intact, fundi    benign, both eyes  Ears:    Normal TM's and external ear canals, both ears  Neck:   Supple, symmetrical, trachea midline, no adenopathy;    thyroid:  no enlargement/tenderness/nodules; no carotid   bruit or JVD  Back:     Symmetric, no  curvature, ROM normal, no CVA tenderness  Lungs:     Clear to auscultation bilaterally, respirations unlabored  Chest Wall:    No tenderness or deformity   Heart:    Normal heart rate. Normal rhythm. No murmurs, rubs, or gallops.   Breast Exam:    deferred  Abdomen:     Soft, non-tender, bowel sounds active all four quadrants,    no masses, no organomegaly  Pelvic:    deferred  Extremities:   All extremities are intact. No cyanosis or edema  Pulses:   2+ and symmetric all extremities  Skin:   Skin color, texture, turgor normal, no rashes or lesions  Lymph nodes:   Cervical, supraclavicular, and axillary nodes normal  Neurologic:   CNII-XII intact, normal strength, sensation and reflexes    throughout    Last depression screening scores PHQ 2/9 Scores 10/07/2020 02/18/2020 02/12/2019  PHQ - 2 Score 0 0 0  PHQ- 9 Score - - 1   Last fall risk screening Fall Risk  10/07/2020  Falls in the past year? 1  Number falls in past yr: 1  Injury with Fall? 1  Risk for fall due to : History of fall(s)  Follow up Falls prevention discussed   Last Audit-C alcohol use screening Alcohol Use Disorder Test (AUDIT) 10/07/2020  1. How often do you have a drink containing alcohol? 0  2. How many drinks containing alcohol do you have on a typical day when you are drinking? 0  3. How often do you have six or more drinks on one occasion? 0  AUDIT-C Score 0  Alcohol Brief Interventions/Follow-up AUDIT Score <7 follow-up not indicated   A score of 3 or more in women, and 4 or more in men indicates increased risk for alcohol abuse, EXCEPT if all of the points are from question 1   No results found for any visits on 10/07/20.  Assessment &  Plan    Routine Health Maintenance and Physical Exam  Exercise Activities and Dietary recommendations Goals    . Increase water intake     Starting 10/05/16, I will increase my water intake to 4 glasses a day.       Immunization History  Administered Date(s)  Administered  . Fluad Quad(high Dose 65+) 08/21/2019  . Influenza Split 08/09/2011  . Influenza, High Dose Seasonal PF 08/24/2015, 09/21/2016, 09/27/2018  . Influenza-Unspecified 09/25/2017, 09/04/2020  . Moderna SARS-COVID-2 Vaccination 12/18/2019, 01/15/2020  . Pneumococcal Conjugate-13 11/03/2014  . Pneumococcal Polysaccharide-23 10/17/2012  . Td 11/03/2014  . Zoster 11/07/2013    Health Maintenance  Topic Date Due  . DEXA SCAN  10/24/2017  . TETANUS/TDAP  11/03/2024  . INFLUENZA VACCINE  Completed  . COVID-19 Vaccine  Completed  . PNA vac Low Risk Adult  Completed    Discussed health benefits of physical activity, and encouraged her to engage in regular exercise appropriate for her age and condition.  1. Annual physical exam   2. Hypothyroidism, unspecified type  - TSH  3. Pain in joint involving pelvic region and thigh, unspecified laterality She had greatly improved reporting pain improving from 10/10 to 3/10 with 6 day prednisone course. Pain is still somewhat limiting and she requests another round of medication.  - predniSONE (DELTASONE) 10 MG tablet; Taper down by 1 tablet by mouth daily starting at 6 day 1, 5 day 2, 4 day 3, 3 day 4, 2 day 5 and 1 day 6. Divide tablets among meals and bedtime each day.  Dispense: 21 tablet; Refill: 0   4. Osteopenia, unspecified location/Estrogen deficiency Due for BMD - VITAMIN D 25 Hydroxy (Vit-D Deficiency, Fractures)  5. Vitamin D deficiency  - VITAMIN D 25 Hydroxy (Vit-D Deficiency, Fractures)  6. Essential (primary) hypertension Very well controlled. Continue current medications.   - amLODipine (NORVASC) 5 MG tablet; Take 1 tablet (5 mg total) by mouth daily.  Dispense: 90 tablet; Refill: 1  7. Osteoarthritis of left knee, unspecified osteoarthritis type   8. Chronic low back pain, unspecified back pain laterality, unspecified whether sciatica present Chronic pain on one tablet hydrocodone/apap four times daily. Had  been alternating 5mg  with 7.5mg , but now taking 7.5mg  with most doses.   9. Depression, unspecified depression type Doing very well with fluoxetine which she wishes to continue unchanged.   10. Hyperlipidemia, mixed She is tolerating pravastatin well with no adverse effects.   - CBC - Comprehensive metabolic panel - Lipid panel   Future Appointments  Date Time Provider [place]  10/19/2020  1:20 PM [facility]  01/15/2021  9:40 AM Birdie Sons, MD [facility]  02/22/2021 10:00 AM [facility]-NURSE HEALTH ADVISOR [facility]        The entirety of the information documented in the History of Present Illness, Review of Systems and Physical Exam were personally obtained by me. Portions of this information were initially documented by the CMA and reviewed by me for thoroughness and accuracy.      Lelon Huh, MD  [facility] [phone] (phone) [phone] (fax)  [place]

## 2020-10-08 LAB — COMPREHENSIVE METABOLIC PANEL
ALT: 18 IU/L (ref 0–32)
AST: 20 IU/L (ref 0–40)
Albumin/Globulin Ratio: 1.5 (ref 1.2–2.2)
Albumin: 4.1 g/dL (ref 3.6–4.6)
Alkaline Phosphatase: 78 IU/L (ref 44–121)
BUN/Creatinine Ratio: 33 — ABNORMAL HIGH (ref 12–28)
BUN: 41 mg/dL — ABNORMAL HIGH (ref 8–27)
Bilirubin Total: 0.2 mg/dL (ref 0.0–1.2)
CO2: 22 mmol/L (ref 20–29)
Calcium: 9.7 mg/dL (ref 8.7–10.3)
Chloride: 102 mmol/L (ref 96–106)
Creatinine, Ser: 1.23 mg/dL — ABNORMAL HIGH (ref 0.57–1.00)
GFR calc Af Amer: 45 mL/min/{1.73_m2} — ABNORMAL LOW (ref >59)
GFR calc non Af Amer: 39 mL/min/{1.73_m2} — ABNORMAL LOW (ref >59)
Globulin, Total: 2.7 g/dL (ref 1.5–4.5)
Glucose: 90 mg/dL (ref 65–99)
Potassium: 3.6 mmol/L (ref 3.5–5.2)
Sodium: 141 mmol/L (ref 134–144)
Total Protein: 6.8 g/dL (ref 6.0–8.5)

## 2020-10-08 LAB — TSH: TSH: 3.38 u[IU]/mL (ref 0.450–4.500)

## 2020-10-08 LAB — CBC
Hematocrit: 38.9 % (ref 34.0–46.6)
Hemoglobin: 12.9 g/dL (ref 11.1–15.9)
MCH: 31.2 pg (ref 26.6–33.0)
MCHC: 33.2 g/dL (ref 31.5–35.7)
MCV: 94 fL (ref 79–97)
Platelets: 232 10*3/uL (ref 150–450)
RBC: 4.13 x10E6/uL (ref 3.77–5.28)
RDW: 12.1 % (ref 11.7–15.4)
WBC: 9.1 10*3/uL (ref 3.4–10.8)

## 2020-10-08 LAB — LIPID PANEL
Chol/HDL Ratio: 5.3 ratio — ABNORMAL HIGH (ref 0.0–4.4)
Cholesterol, Total: 187 mg/dL (ref 100–199)
HDL: 35 mg/dL — ABNORMAL LOW (ref >39)
LDL Chol Calc (NIH): 111 mg/dL — ABNORMAL HIGH (ref 0–99)
Triglycerides: 238 mg/dL — ABNORMAL HIGH (ref 0–149)
VLDL Cholesterol Cal: 41 mg/dL — ABNORMAL HIGH (ref 5–40)

## 2020-10-08 LAB — VITAMIN D 25 HYDROXY (VIT D DEFICIENCY, FRACTURES): Vit D, 25-Hydroxy: 56.2 ng/mL (ref 30.0–100.0)

## 2020-10-19 ENCOUNTER — Ambulatory Visit
Admission: RE | Admit: 2020-10-19 | Discharge: 2020-10-19 | Disposition: A | Discharge status: Home / Self Care | Payer: Medicare Other | Source: Ambulatory Visit | Attending: Family Medicine | Admitting: Family Medicine

## 2020-10-19 ENCOUNTER — Other Ambulatory Visit: Payer: Self-pay

## 2020-10-19 DIAGNOSIS — Z1231 Encounter for screening mammogram for malignant neoplasm of breast: Secondary | ICD-10-CM | POA: Diagnosis present

## 2020-10-23 ENCOUNTER — Other Ambulatory Visit: Payer: Self-pay | Admitting: Family Medicine

## 2020-11-23 ENCOUNTER — Other Ambulatory Visit: Payer: Self-pay | Admitting: Family Medicine

## 2020-11-23 NOTE — Telephone Encounter (Signed)
Requested medication (s) are due for refill today: Yes (soma)  Requested medication (s) are on the active medication list:yes  Last refill: 03/20/20  180  4 refills  Future visit scheduled yes 01/15/21     Notes to clinic: Not delegated.                        Pravastatin requested early via Interface. LRF 10/23/20  90  0 refills.  Requested Prescriptions  Pending Prescriptions Disp Refills   pravastatin (PRAVACHOL) 40 MG tablet [Pharmacy Med Name: PRAVASTATIN  TAB 40MG ] 90 tablet 0    Sig: TAKE 1 TABLET DAILY      Cardiovascular:  Antilipid - Statins Failed - 11/23/2020  7:19 PM      Failed - LDL in normal range and within 360 days    LDL Chol Calc (NIH)  Date Value Ref Range Status  10/07/2020 111 (H) 0 - 99 mg/dL Final          Failed - HDL in normal range and within 360 days    HDL  Date Value Ref Range Status  10/07/2020 35 (L) >39 mg/dL Final          Failed - Triglycerides in normal range and within 360 days    Triglycerides  Date Value Ref Range Status  10/07/2020 238 (H) 0 - 149 mg/dL Final          Passed - Total Cholesterol in normal range and within 360 days    Cholesterol, Total  Date Value Ref Range Status  10/07/2020 187 100 - 199 mg/dL Final          Passed - Patient is not pregnant      Passed - Valid encounter within last 12 months    Recent Outpatient Visits           1 month ago Annual physical exam   [facility] Birdie Sons, MD   1 month ago Right groin pain   [place], Vickki Muff, PA-C   6 months ago Essential (primary) hypertension   [facility], Kirstie Peri, MD   9 months ago Fall, sequela   [facility], [place], PA-C   9 months ago Essential (primary) hypertension   [facility], Kirstie Peri, MD       Future Appointments             In 1 month Fisher, Kirstie Peri, MD [facility]                carisoprodol (SOMA) 350 MG tablet [Pharmacy Med Name: CARISOPRODOL TAB 350MG ] 180 tablet 3    Sig: TAKE 1 TABLET EVERY 6 HOURS      Not Delegated - Analgesics:  Muscle Relaxants Failed - 11/23/2020  7:19 PM      Failed - This refill cannot be delegated      Passed - Valid encounter within last 6 months    Recent Outpatient Visits           1 month ago Annual physical exam   [facility] Birdie Sons, MD   1 month ago Right groin pain   [place], Vickki Muff, PA-C   6 months ago Essential (primary) hypertension   [facility] Birdie Sons, MD   9 months ago Fall, sequela   [place], [place], [place]  9 months ago Essential (primary) hypertension   [facility], Kirstie Peri, MD       Future Appointments             In 1 month Fisher, Kirstie Peri, MD [facility], [place]

## 2020-11-26 ENCOUNTER — Other Ambulatory Visit: Payer: Self-pay

## 2020-11-26 NOTE — Telephone Encounter (Signed)
Pharmacy requesting refills on medication. Please advise. Thanks!

## 2020-11-29 MED ORDER — FLUOXETINE HCL 20 MG PO CAPS
20.0000 mg | ORAL_CAPSULE | Freq: Every day | ORAL | 4 refills | Status: DC
Start: 2020-11-29 — End: 2022-02-15

## 2020-11-30 ENCOUNTER — Encounter: Payer: Self-pay | Admitting: Family Medicine

## 2020-11-30 ENCOUNTER — Other Ambulatory Visit: Payer: Self-pay | Admitting: Family Medicine

## 2020-11-30 DIAGNOSIS — M1712 Unilateral primary osteoarthritis, left knee: Secondary | ICD-10-CM

## 2020-11-30 MED ORDER — HYDROCODONE-ACETAMINOPHEN 7.5-325 MG PO TABS: 1.0000 | ORAL_TABLET | Freq: Four times a day (QID) | ORAL | 0 refills | Status: DC | PRN

## 2021-01-04 ENCOUNTER — Other Ambulatory Visit: Payer: Self-pay | Admitting: Family Medicine

## 2021-01-05 MED ORDER — HYDROCODONE-ACETAMINOPHEN 5-325 MG PO TABS: 1.0000 | ORAL_TABLET | Freq: Four times a day (QID) | ORAL | 0 refills | Status: DC | PRN

## 2021-01-12 ENCOUNTER — Other Ambulatory Visit: Payer: Self-pay | Admitting: Family Medicine

## 2021-01-12 DIAGNOSIS — E782 Mixed hyperlipidemia: Secondary | ICD-10-CM

## 2021-01-12 NOTE — Telephone Encounter (Signed)
Requested Prescriptions  Pending Prescriptions Disp Refills  . omega-3 acid ethyl esters (LOVAZA) 1 g capsule [Pharmacy Med Name: OMEGA-3-ACID CAP 1GM(L)] 360 capsule 3    Sig: TAKE 4 CAPSULES (4 GRAMS   TOTAL) DAILY     Endocrinology:  Nutritional Agents Passed - 01/12/2021  8:13 AM      Passed - Valid encounter within last 12 months    Recent Outpatient Visits          3 months ago Annual physical exam   [facility] Birdie Sons, MD   3 months ago Right groin pain   [place], Vickki Muff, PA-C   8 months ago Essential (primary) hypertension   [facility] Birdie Sons, MD   10 months ago Fall, sequela   [place], [place], [place]   11 months ago Essential (primary) hypertension   [facility], Kirstie Peri, MD      Future Appointments            In 3 days Fisher, Kirstie Peri, MD [facility], [place]

## 2021-01-15 ENCOUNTER — Ambulatory Visit (INDEPENDENT_AMBULATORY_CARE_PROVIDER_SITE_OTHER): Payer: Medicare Other | Admitting: Family Medicine

## 2021-01-15 ENCOUNTER — Other Ambulatory Visit: Payer: Self-pay

## 2021-01-15 ENCOUNTER — Encounter: Payer: Self-pay | Admitting: Family Medicine

## 2021-01-15 VITALS — BP 117/63 | HR 62 | Wt 115.0 lb

## 2021-01-15 DIAGNOSIS — R2689 Other abnormalities of gait and mobility: Secondary | ICD-10-CM | POA: Diagnosis not present

## 2021-01-15 DIAGNOSIS — R29898 Other symptoms and signs involving the musculoskeletal system: Secondary | ICD-10-CM

## 2021-01-15 DIAGNOSIS — I10 Essential (primary) hypertension: Secondary | ICD-10-CM | POA: Diagnosis not present

## 2021-01-15 DIAGNOSIS — M1712 Unilateral primary osteoarthritis, left knee: Secondary | ICD-10-CM

## 2021-01-15 NOTE — Progress Notes (Signed)
Established patient visit   Patient: Julia Salazar   DOB: 1929/11/15   87 y.o. Female  MRN: [id number] Visit Date: 01/15/2021  Today's healthcare provider: Lelon Huh, MD   Chief Complaint  Patient presents with  . Hypertension  . Knee Pain   Subjective    Knee Pain  There was no injury mechanism. The pain is present in the right knee and left knee (Left worse than right). The pain has been worsening since onset. Associated symptoms include muscle weakness. Pertinent negatives include no loss of motion, loss of sensation, numbness or tingling.   Has been having more trouble ambulating due to knee pain. Legs are week and has trouble keeping balance. Has had physical therapy In the past which was helpful. Has also been followed by Dr. Sabra Heck and had several knee injections. Difficulty with ambulation is limiting her ADLs and ability to leave home.   Hypertension, follow-up  BP Readings from Last 3 Encounters:  01/15/21 117/63  10/07/20 (!) 102/56  05/13/20 (!) 110/52   Wt Readings from Last 3 Encounters:  01/15/21 115 lb (52.2 kg)  10/07/20 117 lb (53.1 kg)  05/13/20 117 lb (53.1 kg)     She was last seen for hypertension 3 months ago.  BP at that visit was 102/56. Management since that visit includes no changes. She was a bit hypotensive at that visit, but reports home systolic blood pressure is usually in the 120s and 130s.   She reports excellent compliance with treatment. She is not having side effects.  She is following a Low Sodium diet. She is not exercising. She does not smoke.  Use of agents associated with hypertension: none.   Outside blood pressures are 100-150's/60-80's. Symptoms: No chest pain No chest pressure  No palpitations No syncope  No dyspnea No orthopnea  No paroxysmal nocturnal dyspnea No lower extremity edema   Pertinent labs: Lab Results  Component Value Date   CHOL 187 10/07/2020   HDL 35 (L) 10/07/2020   LDLCALC 111 (H)  10/07/2020   TRIG 238 (H) 10/07/2020   CHOLHDL 5.3 (H) 10/07/2020   Lab Results  Component Value Date   NA 141 10/07/2020   K 3.6 10/07/2020   CREATININE 1.23 (H) 10/07/2020   GFRNONAA 39 (L) 10/07/2020   GFRAA 45 (L) 10/07/2020   GLUCOSE 90 10/07/2020     The ASCVD Risk score (Goff DC Jr., et al., 2013) failed to calculate for the following reasons:   The 2013 ASCVD risk score is only valid for ages 54 to 44   ---------------------------------------------------------------------------------------------------    Medications: Outpatient Medications Prior to Visit  Medication Sig  . amLODipine (NORVASC) 5 MG tablet Take 1 tablet (5 mg total) by mouth daily.  Marland Kitchen aspirin 81 MG tablet Take 1 tablet by mouth daily.  . brimonidine-timolol (COMBIGAN) 0.2-0.5 % ophthalmic solution Place 1 drop into both eyes every 12 (twelve) hours.  . Calcium Carb-Cholecalciferol 600-200 MG-UNIT TABS Take 1 tablet by mouth daily.  . candesartan (ATACAND) 8 MG tablet Take one tablet  . carisoprodol (SOMA) 350 MG tablet TAKE 1 TABLET EVERY 6 HOURS  . cholestyramine light (PREVALITE) 4 GM/DOSE powder MIX 2 GRAMS WITH FLUIDS AND DRINK EVERY 2-3 DAYS AS NEEDED FOR LOOSE BOWELS (Patient taking differently: MIX 2 GRAMS WITH FLUIDS AND DRINK EVERY 2-3 DAYS AS NEEDED FOR LOOSE BOWELS)  . diclofenac Sodium (VOLTAREN) 1 % GEL Apply topically 4 (four) times daily.  . diphenhydrAMINE (BENADRYL) 25 mg capsule  Take 25 mg by mouth at bedtime.   . diphenoxylate-atropine (LOMOTIL) 2.5-0.025 MG tablet Take 1 tablet by mouth 4 (four) times daily as needed.  Marland Kitchen FLUoxetine (PROZAC) 20 MG capsule Take 1 capsule (20 mg total) by mouth daily.  . furosemide (LASIX) 40 MG tablet TAKE 1 TABLET DAILY  . hydrochlorothiazide (HYDRODIURIL) 12.5 MG tablet Take 1 tablet (12.5 mg total) by mouth daily.  Marland Kitchen HYDROcodone-acetaminophen (NORCO) 7.5-325 MG tablet Take 1 tablet by mouth 4 (four) times daily as needed for moderate pain.  Marland Kitchen  HYDROcodone-acetaminophen (NORCO/VICODIN) 5-325 MG tablet Take 1 tablet by mouth every 6 (six) hours as needed for moderate pain.  . meloxicam (MOBIC) 15 MG tablet Take 1 tablet (15 mg total) by mouth daily as needed.  . Multiple Vitamins-Minerals (MULTIVITAMIN ADULT PO) Take 1 tablet by mouth daily.  Marland Kitchen omega-3 acid ethyl esters (LOVAZA) 1 g capsule TAKE 4 CAPSULES (4 GRAMS   TOTAL) DAILY  . pravastatin (PRAVACHOL) 40 MG tablet TAKE 1 TABLET DAILY  . prednisoLONE acetate (PRED FORTE) 1 % ophthalmic suspension Place 1 drop into the left eye daily.   . potassium chloride SA (K-DUR) 20 MEQ tablet TAKE 1 TABLET DAILY   No facility-administered medications prior to visit.    Review of Systems  Constitutional: Positive for fatigue. Negative for activity change, appetite change, chills, diaphoresis, fever and unexpected weight change.  Respiratory: Negative.   Cardiovascular: Negative.   Gastrointestinal: Negative.   Musculoskeletal: Positive for arthralgias, back pain and joint swelling. Negative for gait problem, myalgias, neck pain and neck stiffness.  Neurological: Negative for dizziness, tingling, light-headedness, numbness and headaches.      Objective    BP 117/63 (BP Location: Left Arm, Patient Position: Sitting, Cuff Size: Normal)   Pulse 62   Wt 115 lb (52.2 kg) Comment: Per pt  BMI 23.23 kg/m    Physical Exam   General: Appearance:    Well developed, well nourished female in no acute distress, sitting comfortably in wheelchair.   Eyes:    PERRL, conjunctiva/corneas clear, EOM's intact       Lungs:     Clear to auscultation bilaterally, respirations unlabored  Heart:    Normal heart rate. Normal rhythm. No murmurs, rubs, or gallops.   MS:   All extremities are intact. Difficulty standing from sitting position due to LE weakness and poor balance.   Neurologic:   Awake, alert, oriented x 3. No apparent focal neurological           defect.         Assessment & Plan     1.  Essential (primary) hypertension Reasonable well controlled at home. Normotensive in office today. No symptoms of hypotension. Continue current medications.    2. Osteoarthritis of left knee, unspecified osteoarthritis type Some temporary improvement with Voltaren gel, but is limiting ability to ambulate and perform ADLs, and difficult to leave home.  - Ambulatory referral to [place] Also recommend she get back in with Dr. Sabra Heck as she did have relief with prior injections.   3. Weakness of both lower extremities  - Ambulatory referral to Home Health  4. Balance problems  - Ambulatory referral to [place]  Date Time Provider [place]  02/22/2021 10:00 AM [facility] HEALTH ADVISOR [facility]  06/04/2021  1:40 PM Caryn Section Kirstie Peri, MD [facility]         The entirety of the information documented in the History of Present Illness,  Review of Systems and Physical Exam were personally obtained by me. Portions of this information were initially documented by the CMA and reviewed by me for thoroughness and accuracy.      Lelon Huh, MD  [facility] [phone] (phone) [phone] (fax)  [place]

## 2021-01-25 ENCOUNTER — Encounter: Payer: Self-pay | Admitting: Family Medicine

## 2021-01-25 ENCOUNTER — Telehealth: Payer: Self-pay

## 2021-01-25 NOTE — Telephone Encounter (Signed)
Copied from [place] [id number]. Topic: General - Other >> Jan 25, 2021  2:55 PM Alanda Slim E wrote: Reason for CRM: Pt went to see Dr. Sabra Heck (ortho) and was advise to get an order from Dr. Caryn Section for PT at home / please advise

## 2021-01-26 NOTE — Telephone Encounter (Signed)
Pt has called once about PT and her daughter also and has not gotten a call back, Pt had a really bad nite last nite and is very anxious for this PT to get set up. Please return call to the pt not her daughter for now as she herself wants to make sure she gets this started. Pls have nurse call [phone]

## 2021-01-26 NOTE — Telephone Encounter (Signed)
This order was already placed on 01-15-2021. I don't know why they haven't heard from home Health yet.

## 2021-01-26 NOTE — Telephone Encounter (Signed)
Ok to place an order for PT? Please advise. Thanks!

## 2021-01-28 NOTE — Telephone Encounter (Signed)
Was previously ordered at recent O.V. Per referral note services will begin no later that 02-01-2021

## 2021-01-29 ENCOUNTER — Other Ambulatory Visit: Payer: Self-pay | Admitting: Family Medicine

## 2021-01-29 DIAGNOSIS — K529 Noninfective gastroenteritis and colitis, unspecified: Secondary | ICD-10-CM

## 2021-02-09 DIAGNOSIS — M25559 Pain in unspecified hip: Secondary | ICD-10-CM | POA: Diagnosis not present

## 2021-02-09 DIAGNOSIS — E559 Vitamin D deficiency, unspecified: Secondary | ICD-10-CM

## 2021-02-09 DIAGNOSIS — M545 Low back pain, unspecified: Secondary | ICD-10-CM | POA: Diagnosis not present

## 2021-02-09 DIAGNOSIS — K5730 Diverticulosis of large intestine without perforation or abscess without bleeding: Secondary | ICD-10-CM

## 2021-02-09 DIAGNOSIS — M1712 Unilateral primary osteoarthritis, left knee: Secondary | ICD-10-CM | POA: Diagnosis not present

## 2021-02-09 DIAGNOSIS — K639 Disease of intestine, unspecified: Secondary | ICD-10-CM

## 2021-02-09 DIAGNOSIS — I10 Essential (primary) hypertension: Secondary | ICD-10-CM | POA: Diagnosis not present

## 2021-02-09 DIAGNOSIS — G43909 Migraine, unspecified, not intractable, without status migrainosus: Secondary | ICD-10-CM

## 2021-02-09 DIAGNOSIS — H401190 Primary open-angle glaucoma, unspecified eye, stage unspecified: Secondary | ICD-10-CM

## 2021-02-09 DIAGNOSIS — G4700 Insomnia, unspecified: Secondary | ICD-10-CM

## 2021-02-17 NOTE — Progress Notes (Signed)
Subjective:   Julia Salazar is a 87 y.o. female who presents for Medicare Annual (Subsequent) preventive examination.  I connected with Julia Salazar today by telephone and verified that I am speaking with the correct person using two identifiers. Location patient: home Location provider: work [name], provider.   I discussed the limitations, risks, security and privacy concerns of performing an evaluation and management service by telephone and the availability of in person appointments. I also discussed with the patient that there may be a patient responsible charge related to this service. The patient expressed understanding and verbally consented to this telephonic visit.    Interactive audio and video telecommunications were attempted between this provider and patient, however failed, due to patient having technical difficulties OR patient did not have access to video capability.  We continued and completed visit with audio only.   Review of Systems    N/A  Cardiac Risk Factors include: advanced age (>60men, >2 women);dyslipidemia;hypertension     Objective:    Today's Vitals   02/22/21 0942  PainSc: 4    There is no height or weight on file to calculate BMI.  Advanced Directives 02/22/2021 02/18/2020 02/12/2019 02/08/2018 10/05/2016  Does Patient Have a Medical Advance Directive? Yes Yes Yes Yes Yes  Type of Julia Salazar;Living will Julia Salazar;Living will Julia Salazar;Living will Julia Salazar;Living will;Out of facility DNR (pink MOST or yellow form) Julia Salazar;Living will  Copy of [place] in Chart? Yes - validated most recent copy scanned in chart (See row information) Yes - validated most recent copy scanned in chart (See row information) Yes - validated most recent copy scanned in chart (See row information) Yes Yes     Current Medications (verified) Outpatient Encounter Medications as of 02/22/2021  Medication Sig  . amLODipine (NORVASC) 5 MG tablet Take 1 tablet (5 mg total) by mouth daily.  Marland Kitchen aspirin 81 MG tablet Take 1 tablet by mouth daily.  . brimonidine-timolol (COMBIGAN) 0.2-0.5 % ophthalmic solution Place 1 drop into both eyes every 12 (twelve) hours.  . Calcium Carb-Cholecalciferol 600-200 MG-UNIT TABS Take 1 tablet by mouth daily.  . candesartan (ATACAND) 8 MG tablet Take one tablet  . carisoprodol (SOMA) 350 MG tablet TAKE 1 TABLET EVERY 6 HOURS  . cholestyramine light (PREVALITE) 4 GM/DOSE powder MIX 2 GRAMS WITH FLUIDS AND DRINK EVERY 2-3 DAYS AS NEEDED FOR LOOSE BOWELS (Patient taking differently: MIX 2 GRAMS WITH FLUIDS AND DRINK EVERY 2-3 DAYS AS NEEDED FOR LOOSE BOWELS)  . diclofenac Sodium (VOLTAREN) 1 % GEL Apply topically 4 (four) times daily.  . diphenhydrAMINE (BENADRYL) 25 mg capsule Take 25 mg by mouth at bedtime.   Marland Kitchen FLUoxetine (PROZAC) 20 MG capsule Take 1 capsule (20 mg total) by mouth daily.  . furosemide (LASIX) 40 MG tablet TAKE 1 TABLET DAILY  . hydrochlorothiazide (HYDRODIURIL) 12.5 MG tablet Take 1 tablet (12.5 mg total) by mouth daily.  Marland Kitchen HYDROcodone-acetaminophen (NORCO) 7.5-325 MG tablet Take 1 tablet by mouth 4 (four) times daily as needed for moderate pain.  Marland Kitchen HYDROcodone-acetaminophen (NORCO/VICODIN) 5-325 MG tablet Take 1 tablet by mouth every 6 (six) hours as needed for moderate pain.  . meloxicam (MOBIC) 15 MG tablet Take 1 tablet (15 mg total) by mouth daily as needed.  . Multiple Vitamins-Minerals (MULTIVITAMIN ADULT PO) Take 1 tablet by mouth daily.  Marland Kitchen omega-3 acid ethyl esters (LOVAZA)  1 g capsule TAKE 4 CAPSULES (4 GRAMS   TOTAL) DAILY  . pravastatin (PRAVACHOL) 40 MG tablet TAKE 1 TABLET DAILY  . prednisoLONE acetate (PRED FORTE) 1 % ophthalmic suspension Place 1 drop into the left eye daily.   . diphenoxylate-atropine (LOMOTIL) 2.5-0.025 MG tablet Take 1  tablet by mouth 4 (four) times daily as needed. (Patient not taking: Reported on 02/22/2021)   No facility-administered encounter medications on file as of 02/22/2021.    Allergies (verified) Lovastatin, Sertraline hcl, and Etodolac   History: Past Medical History:  Diagnosis Date  . Depression   . Diverticulosis   . History of chicken pox   . History of measles   . History of mumps   . Hyperlipidemia   . Hypertension   . Ovarian cancer ([place]) 1995   Past Surgical History:  Procedure Laterality Date  . ABDOMINAL HYSTERECTOMY     1973  . APPENDECTOMY  1986  . BACK SURGERY  05/2012   done at [facility] in [place]  . BILATERAL SALPINGOOPHORECTOMY  1986  . CATARACT EXTRACTION  2010   right eye  . CHOLECYSTECTOMY  1998  . COLON RESECTION  1995  . CORNEAL TRANSPLANT    . DESCEMETS STRIPPING AUTOMATED ENDOTHELIAL KERATOPLASTY Left 08/30/2018   Julia Heidelberg, MD Julia Salazar  . OOPHORECTOMY     Family History  Problem Relation Age of Onset  . Colon cancer Mother   . Heart attack Father   . Breast cancer Sister 37  . Leukemia Sister   . Hodgkin's lymphoma Sister    Social History   Socioeconomic History  . Marital status: Widowed    Spouse name: Not on file  . Number of children: 3  . Years of education: LPN  . Highest education level: Associate degree: occupational, Hotel manager, or vocational program  Occupational History  . Occupation: retired  Tobacco Use  . Smoking status: Never Smoker  . Smokeless tobacco: Never Used  Vaping Use  . Vaping Use: Never used  Substance and Sexual Activity  . Alcohol use: No    Alcohol/week: 0.0 standard drinks  . Drug use: No  . Sexual activity: Not on file  Other Topics Concern  . Not on file  Social History Narrative  . Not on file   Social Determinants of Health   Financial Resource Strain: Low Risk   . Difficulty of Paying Living Expenses: Not hard at all  Food Insecurity: No Food Insecurity  . Worried About Ship broker in the Last Year: Never true  . Ran Out of Food in the Last Year: Never true  Transportation Needs: No Transportation Needs  . Lack of Transportation (Medical): No  . Lack of Transportation (Non-Medical): No  Physical Activity: Insufficiently Active  . Days of Exercise per Week: 2 days  . Minutes of Exercise per Session: 60 min  Stress: No Stress Concern Present  . Feeling of Stress : Not at all  Social Connections: Socially Isolated  . Frequency of Communication with Friends and Family: Three times a week  . Frequency of Social Gatherings with Friends and Family: More than three times a week  . Attends Religious Services: Never  . Active Member of Clubs or Organizations: No  . Attends Archivist Meetings: Never  . Marital Status: Widowed    Tobacco Counseling Counseling given: Not Answered   Clinical Intake:  Pre-visit preparation completed: Yes  Pain : 0-10 Pain Score: 4  Pain Type: Chronic pain Pain  Location: Back (and left knee) Pain Orientation: Lower Pain Descriptors / Indicators: Aching Pain Frequency: Constant Pain Relieving Factors: Takes Hydrocodone-Ace as needed for pain.  Pain Relieving Factors: Takes Hydrocodone-Ace as needed for pain.  Nutritional Risks: None Diabetes: No  How often do you need to have someone help you when you read instructions, pamphlets, or other written materials from your doctor or pharmacy?: 1 - Never  Diabetic? No  Interpreter Needed?: No  Information entered by :: [facility], LPN   Activities of Daily Living In your present state of health, do you have any difficulty performing the following activities: 02/22/2021 10/07/2020  Hearing? N Y  Vision? N -  Difficulty concentrating or making decisions? N N  Walking or climbing stairs? Y Y  Comment Due to back and knee pains. -  Dressing or bathing? N -  Doing errands, shopping? Y Y  Comment Does not drive. -  Preparing Food and eating ? N -  Using the  Toilet? N -  In the past six months, have you accidently leaked urine? Y -  Comment Due to frequency. -  Do you have problems with loss of bowel control? N -  Managing your Medications? N -  Managing your Finances? Y -  Comment Daughter assists with finances. -  Housekeeping or managing your Housekeeping? Y -  Comment Has assistance with housework EOW. -  Some recent data might be hidden    Patient Care Team: Birdie Sons, MD as PCP - General (Family Medicine) Oneta Rack, MD (Dermatology) Lorelee Cover., MD as Consulting Physician (Ophthalmology) Conception Chancy, DDS as Referring Physician (Dentistry) Earnestine Leys, MD (Orthopedic Surgery)  Indicate any recent Medical Services you may have received from other than [facility] providers in the past year (date may be approximate).     Assessment:   This is a routine wellness examination for Julia Salazar.  Hearing/Vision screen No exam data present  Dietary issues and exercise activities discussed: Current Exercise Habits: Structured exercise class, Type of exercise: Other - see comments (Sees PT twice a week), Time (Minutes): 60, Frequency (Times/Week): 2, Weekly Exercise (Minutes/Week): 120, Intensity: Mild, Exercise limited by: orthopedic condition(s)  Goals    . Increase water intake     Recommend to increase water intake to 6-8 8 oz glasses a day.      Depression Screen PHQ 2/9 Scores 02/22/2021 10/07/2020 02/18/2020 02/12/2019 02/12/2019 02/08/2018 02/08/2018  PHQ - 2 Score 0 0 0 0 0 0 0  PHQ- 9 Score - - - 1 - 1 -    Fall Risk Fall Risk  02/22/2021 10/07/2020 02/18/2020 02/04/2020 02/12/2019  Falls in the past year? 1 1 1  0 0  Number falls in past yr: 1 1 0 0 -  Injury with Fall? 0 1 0 0 -  Risk for fall due to : Impaired balance/gait History of fall(s) Impaired balance/gait - -  Follow up Falls prevention discussed Falls prevention discussed Falls prevention discussed Falls evaluation completed -  Comment Currently in PT for  balance issues. - - - -    FALL RISK PREVENTION PERTAINING TO THE HOME:  Any stairs in or around the home? No  If so, are there any without handrails? No  Home free of loose throw rugs in walkways, pet beds, electrical cords, etc? Yes  Adequate lighting in your home to reduce risk of falls? Yes   ASSISTIVE DEVICES UTILIZED TO PREVENT FALLS:  Life alert? Yes  Use of a cane, walker  or w/c? Yes  Grab bars in the bathroom? Yes  Shower chair or bench in shower? Yes  Elevated toilet seat or a handicapped toilet? No    Cognitive Function: Normal cognitive status assessed by direct observation by this Nurse Health Advisor. No abnormalities found.      6CIT Screen 02/08/2018 10/05/2016  What Year? 0 points 0 points  What month? 0 points 0 points  What time? 0 points 0 points  Count back from 20 0 points 0 points  Months in reverse 0 points 0 points  Repeat phrase 2 points 2 points  Total Score 2 2    Immunizations Immunization History  Administered Date(s) Administered  . Fluad Quad(high Dose 65+) 08/21/2019  . Influenza Split 08/09/2011  . Influenza, High Dose Seasonal PF 08/24/2015, 09/21/2016, 09/27/2018  . Influenza-Unspecified 09/25/2017, 09/04/2020  . Moderna Sars-Covid-2 Vaccination 12/18/2019, 01/15/2020  . PFIZER(Purple Top)SARS-COV-2 Vaccination 10/22/2020  . Pneumococcal Conjugate-13 11/03/2014  . Pneumococcal Polysaccharide-23 10/17/2012  . Td 11/03/2014  . Zoster 11/07/2013    TDAP status: Up to date  Flu Vaccine status: Up to date  Pneumococcal vaccine status: Up to date  Covid-19 vaccine status: Completed vaccines  Qualifies for Shingles Vaccine? Yes   Zostavax completed Yes   Shingrix Completed?: No.    Education has been provided regarding the importance of this vaccine. Patient has been advised to call insurance company to determine out of pocket expense if they have not yet received this vaccine. Advised may also receive vaccine at local pharmacy or  Health Dept. Verbalized acceptance and understanding.  Screening Tests Health Maintenance  Topic Date Due  . DEXA SCAN  02/22/2022 (Originally 10/24/2017)  . COVID-19 Vaccine (4 - Booster) 04/21/2021  . TETANUS/TDAP  11/03/2024  . INFLUENZA VACCINE  Completed  . PNA vac Low Risk Adult  Completed  . HPV VACCINES  Aged Out    Health Maintenance  There are no preventive care reminders to display for this patient.  Colorectal cancer screening: No longer required.   Mammogram status: No longer required due to age.  Bone Density status: Currently due, declined order at this time.  Lung Cancer Screening: (Low Dose CT Chest recommended if Age 75-80 years, 30 pack-year currently smoking OR have quit w/in 15years.) does not qualify.   Additional Screening:  Vision Screening: Recommended annual ophthalmology exams for early detection of glaucoma and other disorders of the eye. Is the patient up to date with their annual eye exam?  Yes  Who is the provider or what is the name of the office in which the patient attends annual eye exams? Dr Gloriann Loan If pt is not established with a provider, would they like to be referred to a provider to establish care? No .   Dental Screening: Recommended annual dental exams for proper oral hygiene  Community Resource Referral / Chronic Care Management: CRR required this visit?  No   CCM required this visit?  No      Plan:     I have personally reviewed and noted the following in the patient's chart:   . Medical and social history . Use of alcohol, tobacco or illicit drugs  . Current medications and supplements . Functional ability and status . Nutritional status . Physical activity . Advanced directives . List of other physicians . Hospitalizations, surgeries, and ER visits in previous 12 months . Vitals . Screenings to include cognitive, depression, and falls . Referrals and appointments  In addition, I have reviewed and discussed with  patient certain preventive protocols, quality metrics, and best practice recommendations. A written personalized care plan for preventive services as well as general preventive health recommendations were provided to patient.     Julia Salazar, [place]   08/03/1414   Nurse Notes: Declined a DEXA order at this time.

## 2021-02-22 ENCOUNTER — Ambulatory Visit (INDEPENDENT_AMBULATORY_CARE_PROVIDER_SITE_OTHER): Payer: Medicare Other

## 2021-02-22 ENCOUNTER — Other Ambulatory Visit: Payer: Self-pay

## 2021-02-22 DIAGNOSIS — Z0000 Encounter for general adult medical examination without abnormal findings: Secondary | ICD-10-CM | POA: Diagnosis not present

## 2021-02-22 NOTE — Patient Instructions (Signed)
[name] , Thank you for taking time to come for your Medicare Wellness Visit. I appreciate your ongoing commitment to your health goals. Please review the following plan we discussed and let me know if I can assist you in the future.   Screening recommendations/referrals: Colonoscopy: No longer required.  Mammogram: No longer required.  Bone Density: Currently due, declined order. Recommended yearly ophthalmology/optometry visit for glaucoma screening and checkup Recommended yearly dental visit for hygiene and checkup  Vaccinations: Influenza vaccine: Done 09/04/20 Pneumococcal vaccine: Completed series Tdap vaccine: Up to date, due 10/2024 Shingles vaccine: Shingrix discussed. Please contact your pharmacy for coverage information.     Advanced directives: Currently on file.   Conditions/risks identified: Recommend to increase water intake to 6-8 8 oz glasses a day.  Next appointment: 06/04/21 @ 1:40 PM with Dr Julia Salazar    Preventive Care 54 Years and Older, Female Preventive care refers to lifestyle choices and visits with your health care provider that can promote health and wellness. What does preventive care include?  A yearly physical exam. This is also called an annual well check.  Dental exams once or twice a year.  Routine eye exams. Ask your health care provider how often you should have your eyes checked.  Personal lifestyle choices, including:  Daily care of your teeth and gums.  Regular physical activity.  Eating a healthy diet.  Avoiding tobacco and drug use.  Limiting alcohol use.  Practicing safe sex.  Taking low-dose aspirin every day.  Taking vitamin and mineral supplements as recommended by your health care provider. What happens during an annual well check? The services and screenings done by your health care provider during your annual well check will depend on your age, overall health, lifestyle risk factors, and family history of  disease. Counseling  Your health care provider may ask you questions about your:  Alcohol use.  Tobacco use.  Drug use.  Emotional well-being.  Home and relationship well-being.  Sexual activity.  Eating habits.  History of falls.  Memory and ability to understand (cognition).  Work and work Statistician.  Reproductive health. Screening  You may have the following tests or measurements:  Height, weight, and BMI.  Blood pressure.  Lipid and cholesterol levels. These may be checked every 5 years, or more frequently if you are over 58 years old.  Skin check.  Lung cancer screening. You may have this screening every year starting at age 70 if you have a 30-pack-year history of smoking and currently smoke or have quit within the past 15 years.  Fecal occult blood test (FOBT) of the stool. You may have this test every year starting at age 76.  Flexible sigmoidoscopy or colonoscopy. You may have a sigmoidoscopy every 5 years or a colonoscopy every 10 years starting at age 63.  Hepatitis C blood test.  Hepatitis B blood test.  Sexually transmitted disease (STD) testing.  Diabetes screening. This is done by checking your blood sugar (glucose) after you have not eaten for a while (fasting). You may have this done every 1-3 years.  Bone density scan. This is done to screen for osteoporosis. You may have this done starting at age 88.  Mammogram. This may be done every 1-2 years. Talk to your health care provider about how often you should have regular mammograms. Talk with your health care provider about your test results, treatment options, and if necessary, the need for more tests. Vaccines  Your health care provider may recommend certain vaccines,  such as:  Influenza vaccine. This is recommended every year.  Tetanus, diphtheria, and acellular pertussis (Tdap, Td) vaccine. You may need a Td booster every 10 years.  Zoster vaccine. You may need this after age  36.  Pneumococcal 13-valent conjugate (PCV13) vaccine. One dose is recommended after age 64.  Pneumococcal polysaccharide (PPSV23) vaccine. One dose is recommended after age 89. Talk to your health care provider about which screenings and vaccines you need and how often you need them. This information is not intended to replace advice given to you by your health care provider. Make sure you discuss any questions you have with your health care provider. Document Released: 12/18/2015 Document Revised: 08/10/2016 Document Reviewed: 09/22/2015 Elsevier Interactive Patient Education  2017 [place] Prevention in the Home Falls can cause injuries. They can happen to people of all ages. There are many things you can do to make your home safe and to help prevent falls. What can I do on the outside of my home?  Regularly fix the edges of walkways and driveways and fix any cracks.  Remove anything that might make you trip as you walk through a door, such as a raised step or threshold.  Trim any bushes or trees on the path to your home.  Use bright outdoor lighting.  Clear any walking paths of anything that might make someone trip, such as rocks or tools.  Regularly check to see if handrails are loose or broken. Make sure that both sides of any steps have handrails.  Any raised decks and porches should have guardrails on the edges.  Have any leaves, snow, or ice cleared regularly.  Use sand or salt on walking paths during winter.  Clean up any spills in your garage right away. This includes oil or grease spills. What can I do in the bathroom?  Use night lights.  Install grab bars by the toilet and in the tub and shower. Do not use towel bars as grab bars.  Use non-skid mats or decals in the tub or shower.  If you need to sit down in the shower, use a plastic, non-slip stool.  Keep the floor dry. Clean up any water that spills on the floor as soon as it happens.  Remove  soap buildup in the tub or shower regularly.  Attach bath mats securely with double-sided non-slip rug tape.  Do not have throw rugs and other things on the floor that can make you trip. What can I do in the bedroom?  Use night lights.  Make sure that you have a light by your bed that is easy to reach.  Do not use any sheets or blankets that are too big for your bed. They should not hang down onto the floor.  Have a firm chair that has side arms. You can use this for support while you get dressed.  Do not have throw rugs and other things on the floor that can make you trip. What can I do in the kitchen?  Clean up any spills right away.  Avoid walking on wet floors.  Keep items that you use a lot in easy-to-reach places.  If you need to reach something above you, use a strong step stool that has a grab bar.  Keep electrical cords out of the way.  Do not use floor polish or wax that makes floors slippery. If you must use wax, use non-skid floor wax.  Do not have throw rugs and other things on  the floor that can make you trip. What can I do with my stairs?  Do not leave any items on the stairs.  Make sure that there are handrails on both sides of the stairs and use them. Fix handrails that are broken or loose. Make sure that handrails are as long as the stairways.  Check any carpeting to make sure that it is firmly attached to the stairs. Fix any carpet that is loose or worn.  Avoid having throw rugs at the top or bottom of the stairs. If you do have throw rugs, attach them to the floor with carpet tape.  Make sure that you have a light switch at the top of the stairs and the bottom of the stairs. If you do not have them, ask someone to add them for you. What else can I do to help prevent falls?  Wear shoes that:  Do not have high heels.  Have rubber bottoms.  Are comfortable and fit you well.  Are closed at the toe. Do not wear sandals.  If you use a  stepladder:  Make sure that it is fully opened. Do not climb a closed stepladder.  Make sure that both sides of the stepladder are locked into place.  Ask someone to hold it for you, if possible.  Clearly mark and make sure that you can see:  Any grab bars or handrails.  First and last steps.  Where the edge of each step is.  Use tools that help you move around (mobility aids) if they are needed. These include:  Canes.  Walkers.  Scooters.  Crutches.  Turn on the lights when you go into a dark area. Replace any light bulbs as soon as they burn out.  Set up your furniture so you have a clear path. Avoid moving your furniture around.  If any of your floors are uneven, fix them.  If there are any pets around you, be aware of where they are.  Review your medicines with your doctor. Some medicines can make you feel dizzy. This can increase your chance of falling. Ask your doctor what other things that you can do to help prevent falls. This information is not intended to replace advice given to you by your health care provider. Make sure you discuss any questions you have with your health care provider. Document Released: 09/17/2009 Document Revised: 04/28/2016 Document Reviewed: 12/26/2014 Elsevier Interactive Patient Education  2017 [employer].

## 2021-03-03 ENCOUNTER — Other Ambulatory Visit: Payer: Self-pay | Admitting: Family Medicine

## 2021-03-03 DIAGNOSIS — M1712 Unilateral primary osteoarthritis, left knee: Secondary | ICD-10-CM

## 2021-03-04 MED ORDER — HYDROCODONE-ACETAMINOPHEN 7.5-325 MG PO TABS: 1.0000 | ORAL_TABLET | Freq: Four times a day (QID) | ORAL | 0 refills | Status: DC | PRN

## 2021-03-11 IMAGING — MG DIGITAL SCREENING BILAT W/ TOMO W/ CAD
6 of 12 series · 6 of 36 positions shown · non-contrast
Comparison: Previous exam(s).

CLINICAL DATA: Screening.

EXAM:
DIGITAL SCREENING BILATERAL MAMMOGRAM WITH TOMO AND CAD

[L CC synth-2D (1 of 2)]
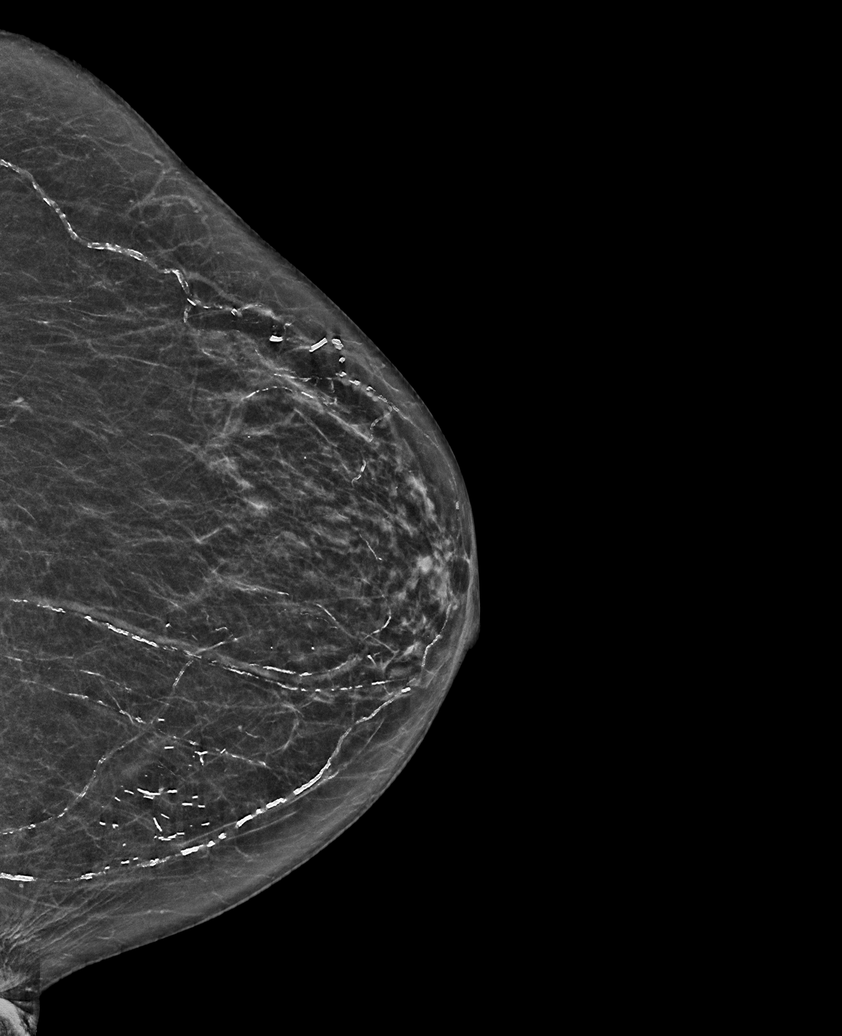

[R MLO synth-2D]
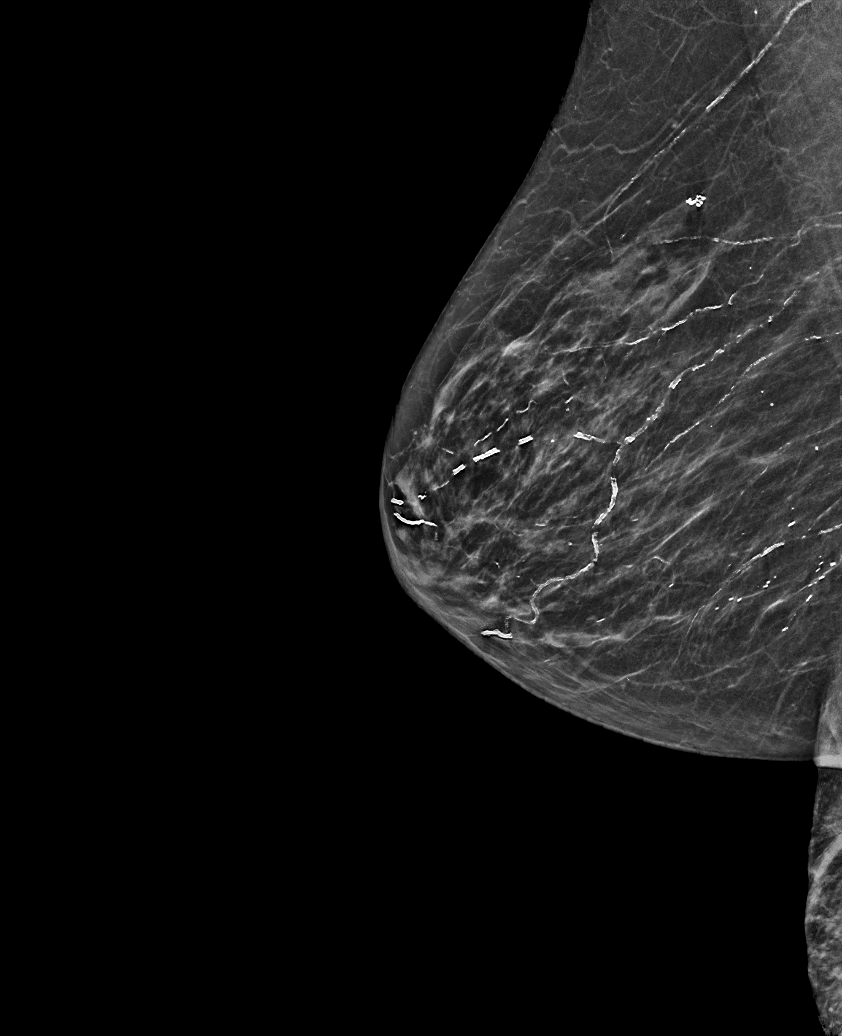

[L CC synth-2D (2 of 2)]
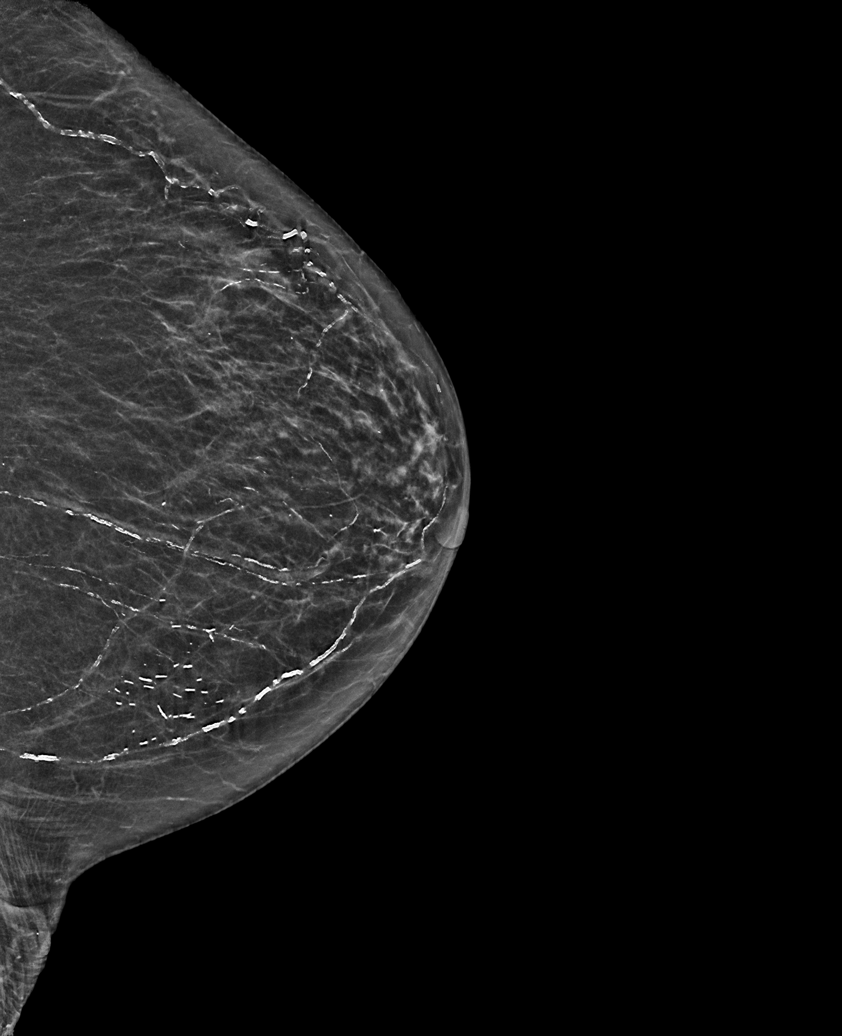

[L MLO synth-2D (1 of 2)]
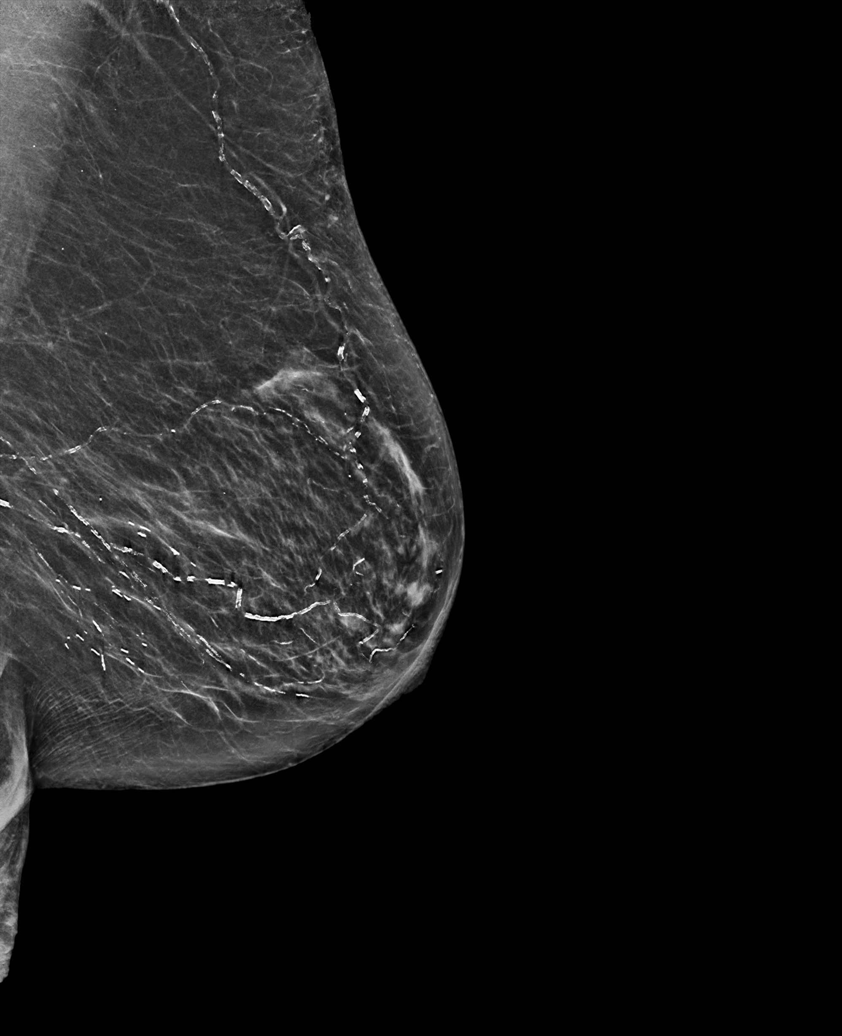

[R CC synth-2D]
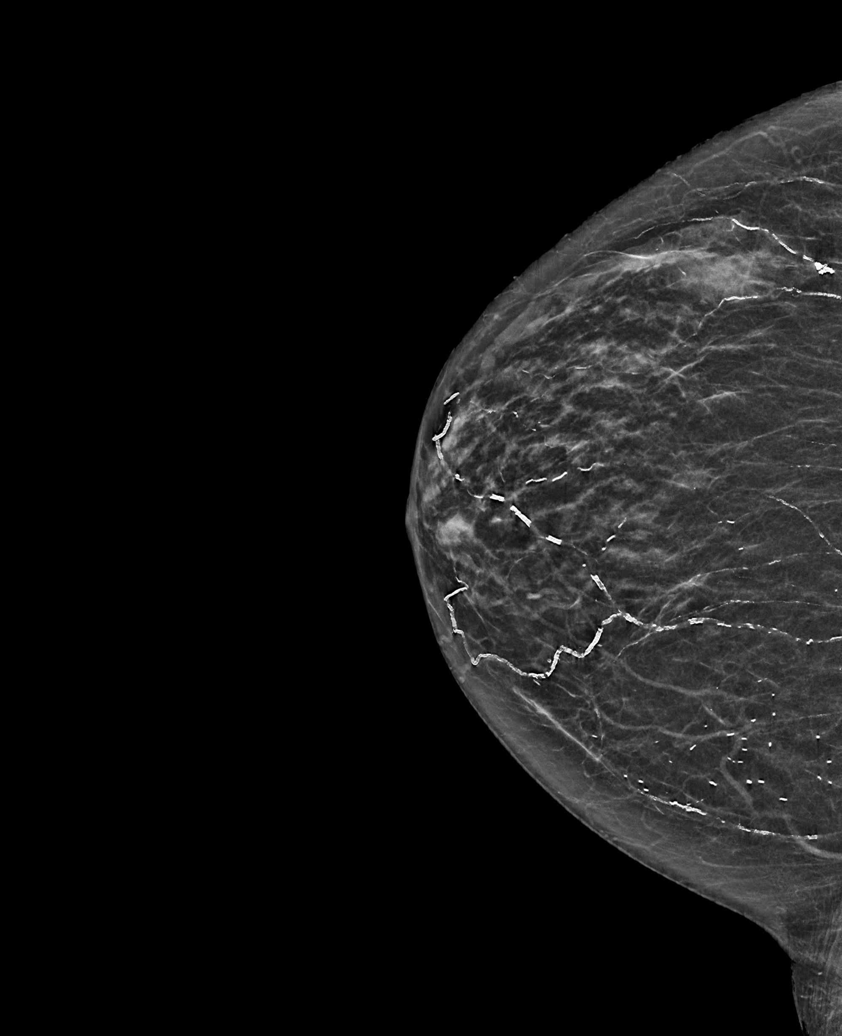

[L MLO synth-2D (2 of 2)]
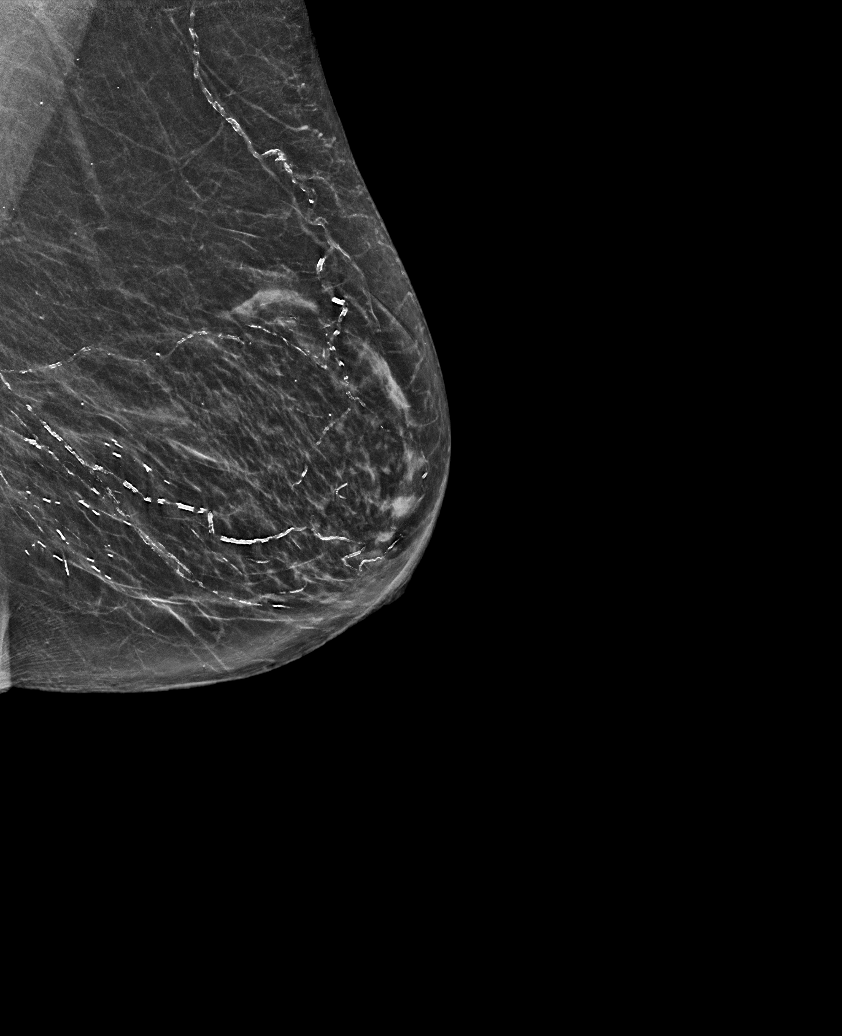

[6 of 36 positions shown; findings below may reference images not displayed]

ACR Breast Density Category b: There are scattered areas of
fibroglandular density.
FINDINGS: There are no findings suspicious for malignancy. Images were
processed with CAD.
IMPRESSION: No mammographic evidence of malignancy. A result letter of this
screening mammogram will be mailed directly to the patient.

RECOMMENDATION:
Screening mammogram in one year. (Code:[id number])

BI-RADS CATEGORY  1: Negative.

## 2021-03-19 ENCOUNTER — Other Ambulatory Visit: Payer: Self-pay | Admitting: Family Medicine

## 2021-03-19 DIAGNOSIS — I10 Essential (primary) hypertension: Secondary | ICD-10-CM

## 2021-03-22 ENCOUNTER — Other Ambulatory Visit: Payer: Self-pay | Admitting: Family Medicine

## 2021-03-23 MED ORDER — CARISOPRODOL 350 MG PO TABS: 350.0000 mg | ORAL_TABLET | Freq: Four times a day (QID) | ORAL | 0 refills | Status: DC | PRN

## 2021-03-24 ENCOUNTER — Other Ambulatory Visit: Payer: Self-pay | Admitting: Family Medicine

## 2021-03-25 ENCOUNTER — Other Ambulatory Visit: Payer: Self-pay | Admitting: Family Medicine

## 2021-03-26 MED ORDER — CARISOPRODOL 350 MG PO TABS
350.0000 mg | ORAL_TABLET | Freq: Four times a day (QID) | ORAL | 0 refills | Status: DC | PRN
Start: 2021-03-26 — End: 2021-12-31

## 2021-03-26 NOTE — Telephone Encounter (Signed)
Pharmacy resubmitted this non delegated refill request for Soma 350 mg.

## 2021-04-01 ENCOUNTER — Other Ambulatory Visit: Payer: Self-pay | Admitting: Family Medicine

## 2021-04-01 MED ORDER — HYDROCODONE-ACETAMINOPHEN 5-325 MG PO TABS: 1.0000 | ORAL_TABLET | Freq: Four times a day (QID) | ORAL | 0 refills | Status: DC | PRN

## 2021-04-19 ENCOUNTER — Encounter: Payer: Self-pay | Admitting: Family Medicine

## 2021-04-20 MED ORDER — FUROSEMIDE 40 MG PO TABS: 40.0000 mg | ORAL_TABLET | Freq: Every day | ORAL | 1 refills | Status: DC

## 2021-05-28 ENCOUNTER — Other Ambulatory Visit: Payer: Self-pay | Admitting: Family Medicine

## 2021-05-28 DIAGNOSIS — M1712 Unilateral primary osteoarthritis, left knee: Secondary | ICD-10-CM

## 2021-05-29 MED ORDER — HYDROCODONE-ACETAMINOPHEN 7.5-325 MG PO TABS: 1.0000 | ORAL_TABLET | Freq: Four times a day (QID) | ORAL | 0 refills | Status: DC | PRN

## 2021-06-04 ENCOUNTER — Ambulatory Visit: Payer: Medicare Other | Admitting: Family Medicine

## 2021-06-29 ENCOUNTER — Other Ambulatory Visit: Payer: Self-pay | Admitting: Family Medicine

## 2021-06-29 DIAGNOSIS — M1712 Unilateral primary osteoarthritis, left knee: Secondary | ICD-10-CM

## 2021-06-29 NOTE — Progress Notes (Signed)
Established patient visit   Patient: Julia Salazar   DOB: 1929/10/18   87 y.o. Female  MRN: [id number] Visit Date: 06/30/2021  Today's healthcare provider: Lelon Huh, MD   Chief Complaint  Patient presents with   Hypertension   Fall   Back Pain   Subjective    Fall Incident onset: Pt reports having three falls in the last four months.  March 21, June 3, June 17 the most recent fall she hurt her back. Point of impact: Right side. The pain is present in the back. The symptoms are aggravated by standing (Walking). Pertinent negatives include no headaches, loss of consciousness, numbness or tingling. Treatments tried: Hydrocodone/acetaminophen. The treatment provided mild relief.   Hypertension, follow-up  BP Readings from Last 3 Encounters:  06/30/21 116/66  01/15/21 117/63  10/07/20 (!) 102/56   Wt Readings from Last 3 Encounters:  06/30/21 114 lb (51.7 kg)  01/15/21 115 lb (52.2 kg)  10/07/20 117 lb (53.1 kg)     She was last seen for hypertension 5 months ago.  BP at that visit was 117/63. Management since that visit includes continue same medication.  She reports excellent compliance with treatment. She is not having side effects.  She is following a Regular diet. She is not exercising. She does not smoke.  Use of agents associated with hypertension: NSAIDS.   Outside blood pressures are normal at home. Symptoms: No chest pain No chest pressure  No palpitations No syncope  No dyspnea No orthopnea  No paroxysmal nocturnal dyspnea Yes lower extremity edema   Pertinent labs: Lab Results  Component Value Date   CHOL 187 10/07/2020   HDL 35 (L) 10/07/2020   LDLCALC 111 (H) 10/07/2020   TRIG 238 (H) 10/07/2020   CHOLHDL 5.3 (H) 10/07/2020   Lab Results  Component Value Date   NA 141 10/07/2020   K 3.6 10/07/2020   CREATININE 1.23 (H) 10/07/2020   GFRNONAA 39 (L) 10/07/2020   GFRAA 45 (L) 10/07/2020   GLUCOSE 90 10/07/2020     The ASCVD Risk  score (Goff DC Jr., et al., 2013) failed to calculate for the following reasons:   The 2013 ASCVD risk score is only valid for ages 53 to 26   ---------------------------------------------------------------------------------------------------   Hypothyroid, follow-up  Lab Results  Component Value Date   TSH 3.380 10/07/2020   TSH 6.500 (H) 10/02/2019   TSH 1.700 05/14/2018   T4TOTAL 7.0 06/09/2016   Wt Readings from Last 3 Encounters:  06/30/21 114 lb (51.7 kg)  01/15/21 115 lb (52.2 kg)  10/07/20 117 lb (53.1 kg)    She was last seen for hypothyroid 8 months ago.  Management since that visit includes continue same medication. She reports excellent compliance with treatment. She is not having side effects.   Symptoms: No change in energy level No constipation  No diarrhea No heat / cold intolerance  No nervousness No palpitations  No weight changes    -----------------------------------------------------------------------------------------      Medications: Outpatient Medications Prior to Visit  Medication Sig   amLODipine (NORVASC) 5 MG tablet TAKE 1 TABLET DAILY   aspirin 81 MG tablet Take 1 tablet by mouth daily.   brimonidine-timolol (COMBIGAN) 0.2-0.5 % ophthalmic solution Place 1 drop into both eyes every 12 (twelve) hours.   Calcium Carb-Cholecalciferol 600-200 MG-UNIT TABS Take 1 tablet by mouth daily.   candesartan (ATACAND) 8 MG tablet Take one tablet   carisoprodol (SOMA) 350 MG tablet Take 1  tablet (350 mg total) by mouth every 6 (six) hours as needed for muscle spasms.   cholestyramine light (PREVALITE) 4 GM/DOSE powder MIX 2 GRAMS WITH FLUIDS AND DRINK EVERY 2-3 DAYS AS NEEDED FOR LOOSE BOWELS (Patient taking differently: MIX 2 GRAMS WITH FLUIDS AND DRINK EVERY 2-3 DAYS AS NEEDED FOR LOOSE BOWELS)   diclofenac Sodium (VOLTAREN) 1 % GEL Apply topically 4 (four) times daily.   diphenhydrAMINE (BENADRYL) 25 mg capsule Take 25 mg by mouth at bedtime.     diphenoxylate-atropine (LOMOTIL) 2.5-0.025 MG tablet Take 1 tablet by mouth 4 (four) times daily as needed.   FLUoxetine (PROZAC) 20 MG capsule Take 1 capsule (20 mg total) by mouth daily.   furosemide (LASIX) 40 MG tablet Take 1 tablet (40 mg total) by mouth daily. As needed for swelling   hydrochlorothiazide (HYDRODIURIL) 12.5 MG tablet TAKE 1 TABLET DAILY   HYDROcodone-acetaminophen (NORCO) 7.5-325 MG tablet Take 1 tablet by mouth 4 (four) times daily as needed for moderate pain.   HYDROcodone-acetaminophen (NORCO/VICODIN) 5-325 MG tablet Take 1 tablet by mouth every 6 (six) hours as needed for moderate pain.   meloxicam (MOBIC) 15 MG tablet Take 1 tablet (15 mg total) by mouth daily as needed.   Multiple Vitamins-Minerals (MULTIVITAMIN ADULT PO) Take 1 tablet by mouth daily.   omega-3 acid ethyl esters (LOVAZA) 1 g capsule TAKE 4 CAPSULES (4 GRAMS   TOTAL) DAILY   pravastatin (PRAVACHOL) 40 MG tablet TAKE 1 TABLET DAILY   prednisoLONE acetate (PRED FORTE) 1 % ophthalmic suspension Place 1 drop into the left eye daily.    No facility-administered medications prior to visit.    Review of Systems  Constitutional: Negative.   Respiratory: Negative.    Cardiovascular: Negative.   Gastrointestinal: Negative.   Musculoskeletal:  Positive for back pain. Negative for arthralgias, gait problem, joint swelling, myalgias, neck pain and neck stiffness.  Neurological:  Negative for dizziness, tingling, loss of consciousness, light-headedness, numbness and headaches.      Objective    BP 116/66 (BP Location: Left Arm, Patient Position: Sitting, Cuff Size: Normal)   Pulse 70   Wt 114 lb (51.7 kg) Comment: Per pt.  SpO2 97%   BMI 23.03 kg/m    Physical Exam  General appearance: Well developed, well nourished female, cooperative and in no acute distress Head: Normocephalic, without obvious abnormality, atraumatic Respiratory: Respirations even and unlabored, normal respiratory  rate Extremities: All extremities are intact. Tender right lower para lumbar muscles.  Skin: Skin color, texture, turgor normal. No rashes seen  Psych: Appropriate mood and affect. Neurologic: Mental status: Alert, oriented to person, place, and time, thought content appropriate.    Assessment & Plan     1. Acute right-sided low back pain without sciatica  - DG Lumbar Spine Complete; Future  2. Essential (primary) hypertension Well controlled.  Continue current medications.   - Comprehensive metabolic panel - CBC  3. Hypothyroidism, unspecified type  - TSH  4. Vitamin D deficiency  - VITAMIN D 25 Hydroxy (Vit-D Deficiency, Fractures)      The entirety of the information documented in the History of Present Illness, Review of Systems and Physical Exam were personally obtained by me. Portions of this information were initially documented by the CMA and reviewed by me for thoroughness and accuracy.     Lelon Huh, MD  [facility] [phone] (phone) [phone] (fax)  [place]

## 2021-06-30 ENCOUNTER — Ambulatory Visit
Admission: RE | Admit: 2021-06-30 | Discharge: 2021-06-30 | Disposition: A | Discharge status: Home / Self Care | Payer: Medicare Other | Source: Ambulatory Visit | Attending: Family Medicine | Admitting: Family Medicine

## 2021-06-30 ENCOUNTER — Encounter: Payer: Self-pay | Admitting: Family Medicine

## 2021-06-30 ENCOUNTER — Ambulatory Visit
Admission: RE | Admit: 2021-06-30 | Discharge: 2021-06-30 | Disposition: A | Discharge status: Home / Self Care | Payer: Medicare Other | Attending: Family Medicine | Admitting: Family Medicine

## 2021-06-30 ENCOUNTER — Other Ambulatory Visit: Payer: Self-pay

## 2021-06-30 ENCOUNTER — Ambulatory Visit (INDEPENDENT_AMBULATORY_CARE_PROVIDER_SITE_OTHER): Payer: Medicare Other | Admitting: Family Medicine

## 2021-06-30 VITALS — BP 116/66 | HR 70 | Wt 114.0 lb

## 2021-06-30 DIAGNOSIS — E559 Vitamin D deficiency, unspecified: Secondary | ICD-10-CM | POA: Diagnosis not present

## 2021-06-30 DIAGNOSIS — M545 Low back pain, unspecified: Secondary | ICD-10-CM | POA: Diagnosis not present

## 2021-06-30 DIAGNOSIS — I10 Essential (primary) hypertension: Secondary | ICD-10-CM

## 2021-06-30 DIAGNOSIS — E039 Hypothyroidism, unspecified: Secondary | ICD-10-CM

## 2021-06-30 MED ORDER — HYDROCODONE-ACETAMINOPHEN 7.5-325 MG PO TABS: 1.0000 | ORAL_TABLET | Freq: Four times a day (QID) | ORAL | 0 refills | Status: DC | PRN

## 2021-06-30 MED ORDER — HYDROCODONE-ACETAMINOPHEN 5-325 MG PO TABS: 1.0000 | ORAL_TABLET | Freq: Four times a day (QID) | ORAL | 0 refills | Status: DC | PRN

## 2021-06-30 NOTE — Progress Notes (Deleted)
      Established patient visit   Patient: Julia Salazar   DOB: 30-Jan-1929   87 y.o. Female  MRN: [id number] Visit Date: 06/30/2021  Today's healthcare provider: Lelon Huh, MD   Chief Complaint  Patient presents with   Hypertension   Fall   Back Pain   Subjective    HPI  ***  {Show patient history (optional):23778}   Medications: Outpatient Medications Prior to Visit  Medication Sig   amLODipine (NORVASC) 5 MG tablet TAKE 1 TABLET DAILY   aspirin 81 MG tablet Take 1 tablet by mouth daily.   brimonidine-timolol (COMBIGAN) 0.2-0.5 % ophthalmic solution Place 1 drop into both eyes every 12 (twelve) hours.   Calcium Carb-Cholecalciferol 600-200 MG-UNIT TABS Take 1 tablet by mouth daily.   candesartan (ATACAND) 8 MG tablet Take one tablet   carisoprodol (SOMA) 350 MG tablet Take 1 tablet (350 mg total) by mouth every 6 (six) hours as needed for muscle spasms.   cholestyramine light (PREVALITE) 4 GM/DOSE powder MIX 2 GRAMS WITH FLUIDS AND DRINK EVERY 2-3 DAYS AS NEEDED FOR LOOSE BOWELS (Patient taking differently: MIX 2 GRAMS WITH FLUIDS AND DRINK EVERY 2-3 DAYS AS NEEDED FOR LOOSE BOWELS)   diclofenac Sodium (VOLTAREN) 1 % GEL Apply topically 4 (four) times daily.   diphenhydrAMINE (BENADRYL) 25 mg capsule Take 25 mg by mouth at bedtime.    diphenoxylate-atropine (LOMOTIL) 2.5-0.025 MG tablet Take 1 tablet by mouth 4 (four) times daily as needed.   FLUoxetine (PROZAC) 20 MG capsule Take 1 capsule (20 mg total) by mouth daily.   furosemide (LASIX) 40 MG tablet Take 1 tablet (40 mg total) by mouth daily. As needed for swelling   hydrochlorothiazide (HYDRODIURIL) 12.5 MG tablet TAKE 1 TABLET DAILY   HYDROcodone-acetaminophen (NORCO) 7.5-325 MG tablet Take 1 tablet by mouth 4 (four) times daily as needed for moderate pain.   HYDROcodone-acetaminophen (NORCO/VICODIN) 5-325 MG tablet Take 1 tablet by mouth every 6 (six) hours as needed for moderate pain.   meloxicam (MOBIC) 15 MG  tablet Take 1 tablet (15 mg total) by mouth daily as needed.   Multiple Vitamins-Minerals (MULTIVITAMIN ADULT PO) Take 1 tablet by mouth daily.   omega-3 acid ethyl esters (LOVAZA) 1 g capsule TAKE 4 CAPSULES (4 GRAMS   TOTAL) DAILY   pravastatin (PRAVACHOL) 40 MG tablet TAKE 1 TABLET DAILY   prednisoLONE acetate (PRED FORTE) 1 % ophthalmic suspension Place 1 drop into the left eye daily.    No facility-administered medications prior to visit.    Review of Systems  {Labs  Heme  Chem  Endocrine  Serology  Results Review (optional):23779}   Objective    BP 116/66 (BP Location: Left Arm, Patient Position: Sitting, Cuff Size: Normal)   Pulse 70   Wt 114 lb (51.7 kg) Comment: Per pt.  SpO2 97%   BMI 23.03 kg/m  {Show previous vital signs (optional):23777}   Physical Exam  ***  No results found for any visits on 06/30/21.  Assessment & Plan     ***  No follow-ups on file.      {provider attestation***:1}   Lelon Huh, MD  [facility] [phone] (phone) [phone] (fax)  [place]

## 2021-06-30 NOTE — Patient Instructions (Signed)
Please review the attached list of medications and notify my office if there are any errors.   Go to the [facility] on [address] for low back Xray  

## 2021-07-01 ENCOUNTER — Telehealth: Payer: Self-pay

## 2021-07-01 LAB — COMPREHENSIVE METABOLIC PANEL
ALT: 11 IU/L (ref 0–32)
AST: 15 IU/L (ref 0–40)
Albumin/Globulin Ratio: 1.4 (ref 1.2–2.2)
Albumin: 4.1 g/dL (ref 3.5–4.6)
Alkaline Phosphatase: 96 IU/L (ref 44–121)
BUN/Creatinine Ratio: 42 — ABNORMAL HIGH (ref 12–28)
BUN: 54 mg/dL — ABNORMAL HIGH (ref 10–36)
Bilirubin Total: <0.2 mg/dL (ref 0.0–1.2)
CO2: 25 mmol/L (ref 20–29)
Calcium: 9.8 mg/dL (ref 8.7–10.3)
Chloride: 101 mmol/L (ref 96–106)
Creatinine, Ser: 1.3 mg/dL — ABNORMAL HIGH (ref 0.57–1.00)
Globulin, Total: 3 g/dL (ref 1.5–4.5)
Glucose: 95 mg/dL (ref 65–99)
Potassium: 3.3 mmol/L — ABNORMAL LOW (ref 3.5–5.2)
Sodium: 144 mmol/L (ref 134–144)
Total Protein: 7.1 g/dL (ref 6.0–8.5)
eGFR: 39 mL/min/{1.73_m2} — ABNORMAL LOW (ref >59)

## 2021-07-01 LAB — CBC
Hematocrit: 37.1 % (ref 34.0–46.6)
Hemoglobin: 12.7 g/dL (ref 11.1–15.9)
MCH: 31.4 pg (ref 26.6–33.0)
MCHC: 34.2 g/dL (ref 31.5–35.7)
MCV: 92 fL (ref 79–97)
Platelets: 239 10*3/uL (ref 150–450)
RBC: 4.04 x10E6/uL (ref 3.77–5.28)
RDW: 12.2 % (ref 11.7–15.4)
WBC: 14.3 10*3/uL — ABNORMAL HIGH (ref 3.4–10.8)

## 2021-07-01 LAB — VITAMIN D 25 HYDROXY (VIT D DEFICIENCY, FRACTURES): Vit D, 25-Hydroxy: 54.4 ng/mL (ref 30.0–100.0)

## 2021-07-01 LAB — TSH: TSH: 4.88 u[IU]/mL — ABNORMAL HIGH (ref 0.450–4.500)

## 2021-07-02 ENCOUNTER — Telehealth: Payer: Self-pay

## 2021-07-02 NOTE — Telephone Encounter (Signed)
Please see result note 

## 2021-07-02 NOTE — Telephone Encounter (Signed)
Pt is requesting call back for her imaging X-ray results from X-rays done on 06/30/21.

## 2021-07-02 NOTE — Telephone Encounter (Signed)
-----   Message from Birdie Sons, MD sent at 07/02/2021 12:52 PM EDT ----- Julia Salazar shows old compression fractures and scoliosis, but no new injuries of the spine. Should get better with time. She may want to follow up with Dr. Sabra Heck if not improving.

## 2021-07-02 NOTE — Telephone Encounter (Signed)
Advised patient of results.  

## 2021-07-02 NOTE — Telephone Encounter (Signed)
Please review. Thanks!  

## 2021-07-05 NOTE — Telephone Encounter (Signed)
Patient called to get copy of her xray pictures to take to orthopaedic. She is asking if her daughter or son can pick them up for her. Please call back

## 2021-07-05 NOTE — Telephone Encounter (Signed)
Pt requested a copy of her Xrays, please advise.

## 2021-07-09 ENCOUNTER — Encounter: Payer: Self-pay | Admitting: Family Medicine

## 2021-07-09 DIAGNOSIS — R32 Unspecified urinary incontinence: Secondary | ICD-10-CM

## 2021-07-13 MED ORDER — OXYBUTYNIN CHLORIDE ER 5 MG PO TB24: 5.0000 mg | ORAL_TABLET | Freq: Every day | ORAL | 1 refills | Status: DC

## 2021-07-20 ENCOUNTER — Encounter: Payer: Self-pay | Admitting: Family Medicine

## 2021-07-20 MED ORDER — MELOXICAM 15 MG PO TABS: 15.0000 mg | ORAL_TABLET | Freq: Every day | ORAL | 1 refills | Status: DC | PRN

## 2021-07-21 ENCOUNTER — Encounter: Payer: Self-pay | Admitting: Family Medicine

## 2021-08-04 ENCOUNTER — Other Ambulatory Visit: Payer: Self-pay | Admitting: Family Medicine

## 2021-08-04 DIAGNOSIS — R32 Unspecified urinary incontinence: Secondary | ICD-10-CM

## 2021-08-31 ENCOUNTER — Other Ambulatory Visit: Payer: Self-pay | Admitting: Family Medicine

## 2021-08-31 DIAGNOSIS — R32 Unspecified urinary incontinence: Secondary | ICD-10-CM

## 2021-09-08 ENCOUNTER — Other Ambulatory Visit: Payer: Self-pay | Admitting: Family Medicine

## 2021-09-08 DIAGNOSIS — I10 Essential (primary) hypertension: Secondary | ICD-10-CM

## 2021-09-27 ENCOUNTER — Other Ambulatory Visit: Payer: Self-pay | Admitting: Family Medicine

## 2021-09-27 DIAGNOSIS — M1712 Unilateral primary osteoarthritis, left knee: Secondary | ICD-10-CM

## 2021-09-28 MED ORDER — HYDROCODONE-ACETAMINOPHEN 7.5-325 MG PO TABS: 1.0000 | ORAL_TABLET | Freq: Four times a day (QID) | ORAL | 0 refills | Status: DC | PRN

## 2021-09-28 MED ORDER — HYDROCODONE-ACETAMINOPHEN 5-325 MG PO TABS: 1.0000 | ORAL_TABLET | Freq: Four times a day (QID) | ORAL | 0 refills | Status: DC | PRN

## 2021-09-28 NOTE — Telephone Encounter (Signed)
LOV: 06/30/2021  NOV: 02/28/2022   Last Refill: 06/30/2021 #30 0 Refills.

## 2021-10-12 ENCOUNTER — Other Ambulatory Visit: Payer: Self-pay | Admitting: Family Medicine

## 2021-10-13 NOTE — Telephone Encounter (Signed)
Requested Prescriptions  Pending Prescriptions Disp Refills  . furosemide (LASIX) 40 MG tablet [Pharmacy Med Name: FUROSEMIDE TAB 40MG ] 90 tablet 0    Sig: TAKE 1 TABLET DAILY AS     NEEDED FOR SWELLING     Cardiovascular:  Diuretics - Loop Failed - 10/12/2021  7:36 PM      Failed - K in normal range and within 360 days    Potassium  Date Value Ref Range Status  06/30/2021 3.3 (L) 3.5 - 5.2 mmol/L Final         Failed - Cr in normal range and within 360 days    Creatinine, Ser  Date Value Ref Range Status  06/30/2021 1.30 (H) 0.57 - 1.00 mg/dL Final         Passed - Ca in normal range and within 360 days    Calcium  Date Value Ref Range Status  06/30/2021 9.8 8.7 - 10.3 mg/dL Final         Passed - Na in normal range and within 360 days    Sodium  Date Value Ref Range Status  06/30/2021 144 134 - 144 mmol/L Final         Passed - Last BP in normal range    BP Readings from Last 1 Encounters:  06/30/21 116/66         Passed - Valid encounter within last 6 months    Recent Outpatient Visits          3 months ago Acute right-sided low back pain without sciatica   [facility] Birdie Sons, MD   9 months ago Essential (primary) hypertension   [facility] Birdie Sons, MD   1 year ago Annual physical exam   [facility] Birdie Sons, MD   1 year ago Right groin pain   [place], Vickki Muff, PA-C   1 year ago Essential (primary) hypertension   [facility], Kirstie Peri, MD

## 2021-10-14 ENCOUNTER — Other Ambulatory Visit: Payer: Self-pay | Admitting: Family Medicine

## 2021-10-15 NOTE — Telephone Encounter (Signed)
Requested medications are due for refill today yes  Requested medications are on the active medication list yes  Last refill 08/03/21  Last visit 06/30/21  Future visit scheduled 02/28/22  Notes to clinic failed protocol of labs greater than 360 days, next appt not until Mar 2023, please assess. Requested Prescriptions  Pending Prescriptions Disp Refills   pravastatin (PRAVACHOL) 40 MG tablet [Pharmacy Med Name: PRAVASTATIN  TAB 40MG ] 90 tablet 3    Sig: TAKE 1 TABLET DAILY     Cardiovascular:  Antilipid - Statins Failed - 10/14/2021  6:16 PM      Failed - Total Cholesterol in normal range and within 360 days    Cholesterol, Total  Date Value Ref Range Status  10/07/2020 187 100 - 199 mg/dL Final          Failed - LDL in normal range and within 360 days    LDL Chol Calc (NIH)  Date Value Ref Range Status  10/07/2020 111 (H) 0 - 99 mg/dL Final          Failed - HDL in normal range and within 360 days    HDL  Date Value Ref Range Status  10/07/2020 35 (L) >39 mg/dL Final          Failed - Triglycerides in normal range and within 360 days    Triglycerides  Date Value Ref Range Status  10/07/2020 238 (H) 0 - 149 mg/dL Final          Passed - Patient is not pregnant      Passed - Valid encounter within last 12 months    Recent Outpatient Visits           3 months ago Acute right-sided low back pain without sciatica   [facility] Birdie Sons, MD   9 months ago Essential (primary) hypertension   [facility], Kirstie Peri, MD   1 year ago Annual physical exam   [facility] Birdie Sons, MD   1 year ago Right groin pain   [place] [facility], Vickki Muff, PA-C   1 year ago Essential (primary) hypertension   [facility], Kirstie Peri, MD

## 2021-11-14 ENCOUNTER — Other Ambulatory Visit: Payer: Self-pay | Admitting: Family Medicine

## 2021-11-14 NOTE — Telephone Encounter (Signed)
last RF 07/20/21 #90 1 RF (should have enough until Feb 2023)  Requested Prescriptions  Refused Prescriptions Disp Refills  . meloxicam (MOBIC) 15 MG tablet [Pharmacy Med Name: MELOXICAM 15 MG TABLET] 90 tablet 1    Sig: TAKE 1 TABLET BY MOUTH DAILY AS NEEDED.     Analgesics:  COX2 Inhibitors Failed - 11/14/2021 10:14 AM      Failed - Cr in normal range and within 360 days    Creatinine, Ser  Date Value Ref Range Status  06/30/2021 1.30 (H) 0.57 - 1.00 mg/dL Final         Passed - HGB in normal range and within 360 days    Hemoglobin  Date Value Ref Range Status  06/30/2021 12.7 11.1 - 15.9 g/dL Final         Passed - Patient is not pregnant      Passed - Valid encounter within last 12 months    Recent Outpatient Visits          4 months ago Acute right-sided low back pain without sciatica   [facility] Birdie Sons, MD   10 months ago Essential (primary) hypertension   [facility], Kirstie Peri, MD   1 year ago Annual physical exam   [facility] Birdie Sons, MD   1 year ago Right groin pain   [place], Vickki Muff, PA-C   1 year ago Essential (primary) hypertension   [facility], Kirstie Peri, MD

## 2021-11-15 MED ORDER — MELOXICAM 15 MG PO TABS: 15.0000 mg | ORAL_TABLET | Freq: Every day | ORAL | 1 refills | Status: DC | PRN

## 2021-11-16 ENCOUNTER — Other Ambulatory Visit: Payer: Self-pay | Admitting: Family Medicine

## 2021-11-16 NOTE — Telephone Encounter (Signed)
Requested Prescriptions  Pending Prescriptions Disp Refills  . candesartan (ATACAND) 8 MG tablet [Pharmacy Med Name: CANDESARTAN CILEXETIL 8 MG TAB] 90 tablet 0    Sig: TAKE 1 TABLET BY MOUTH EVERY DAY     Cardiovascular:  Angiotensin Receptor Blockers Failed - 11/16/2021  1:28 AM      Failed - Cr in normal range and within 180 days    Creatinine, Ser  Date Value Ref Range Status  06/30/2021 1.30 (H) 0.57 - 1.00 mg/dL Final         Failed - K in normal range and within 180 days    Potassium  Date Value Ref Range Status  06/30/2021 3.3 (L) 3.5 - 5.2 mmol/L Final         Passed - Patient is not pregnant      Passed - Last BP in normal range    BP Readings from Last 1 Encounters:  06/30/21 116/66         Passed - Valid encounter within last 6 months    Recent Outpatient Visits          4 months ago Acute right-sided low back pain without sciatica   [facility] Birdie Sons, MD   10 months ago Essential (primary) hypertension   [place], Kirstie Peri, MD   1 year ago Annual physical exam   [facility] Birdie Sons, MD   1 year ago Right groin pain   [place], Vickki Muff, PA-C   1 year ago Essential (primary) hypertension   [facility], Kirstie Peri, MD

## 2021-11-20 IMAGING — CR DG LUMBAR SPINE COMPLETE 4+V
1 series · 6 of 6 positions shown · non-contrast
Comparison: 01/20/2015

CLINICAL DATA: Right-sided low back pain after a fall on
05/21/2021.

EXAM:
LUMBAR SPINE - COMPLETE 4+ VIEW

[Series 1: dg lumbar spine complete 4 +v · 0.14mm/px · 6 of 6 slices shown]
[im 1/6]
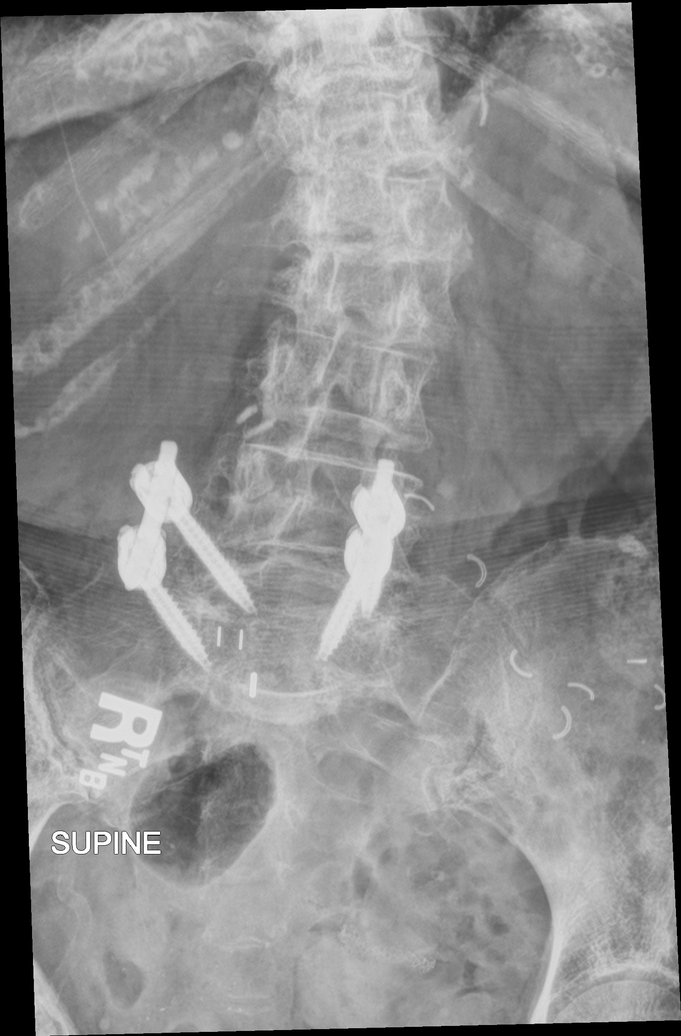
[im 2/6]
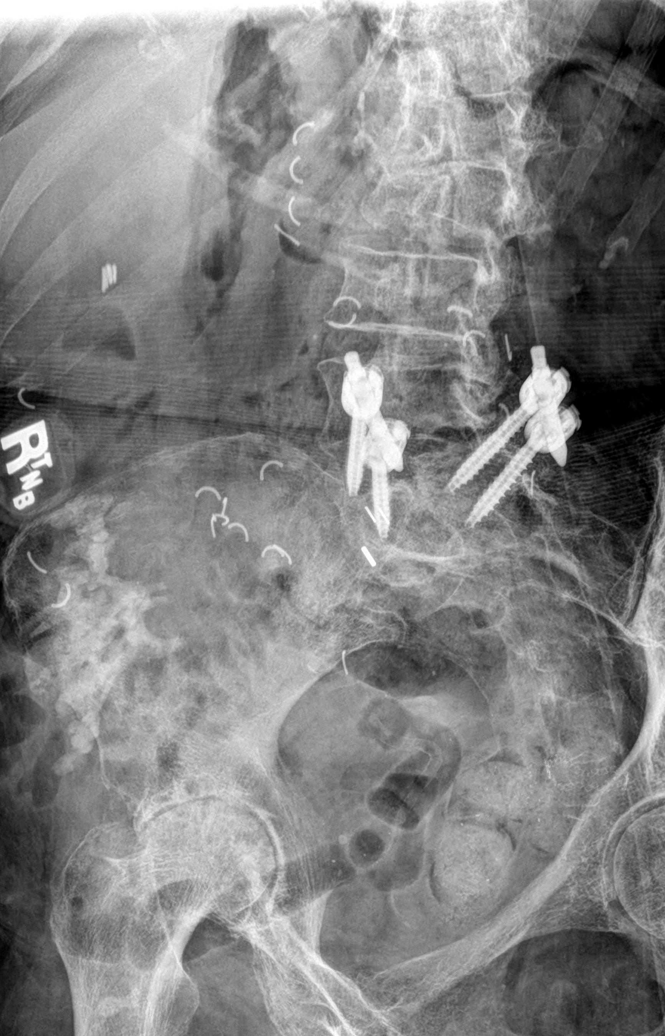
[im 3/6]
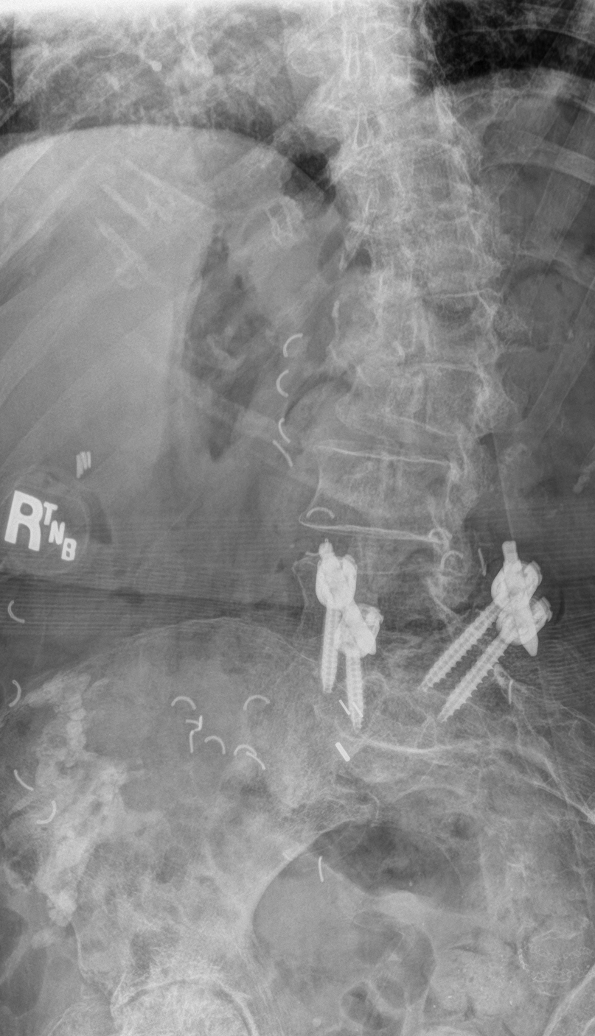
[im 4/6]
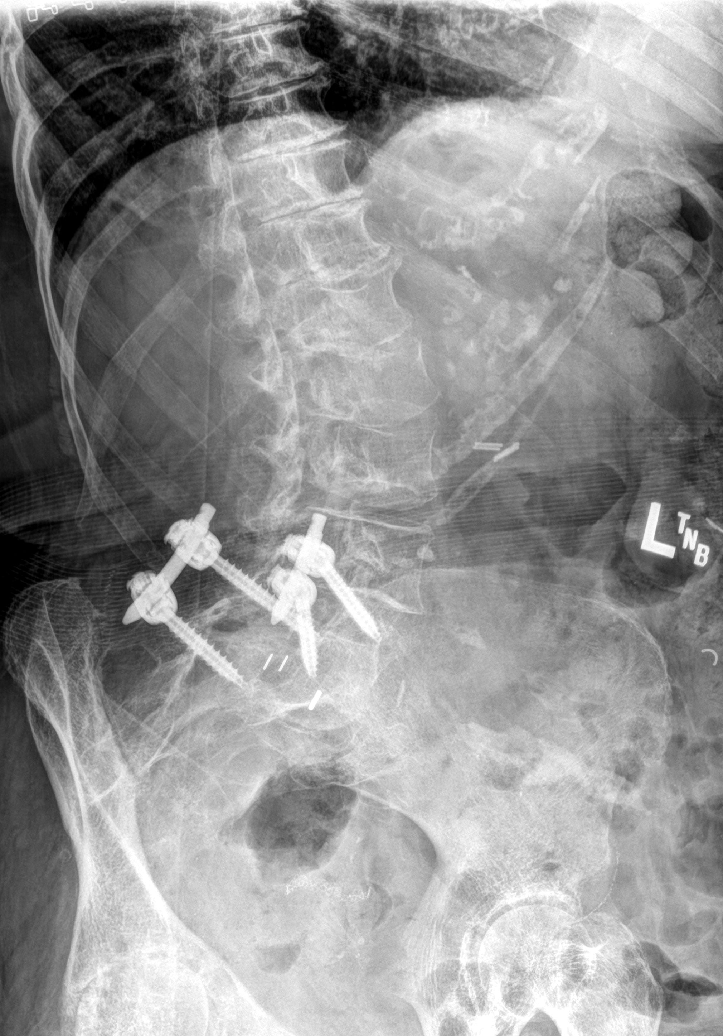
[im 5/6]
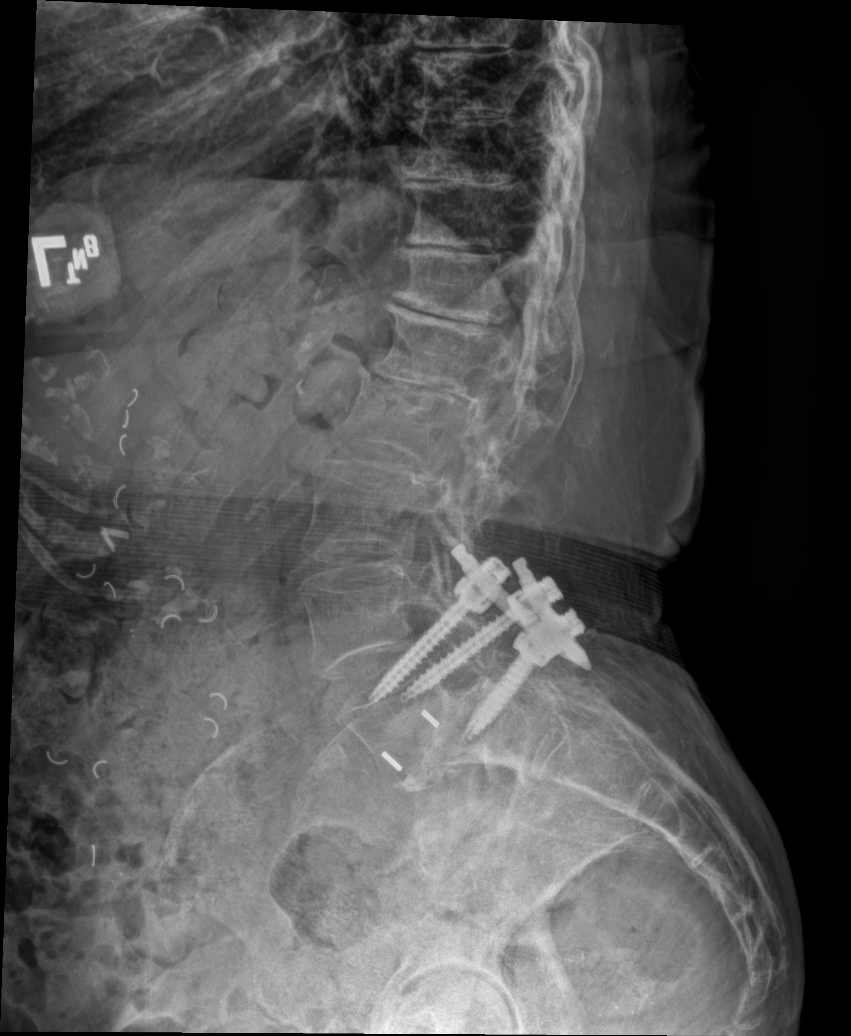
[im 6/6]
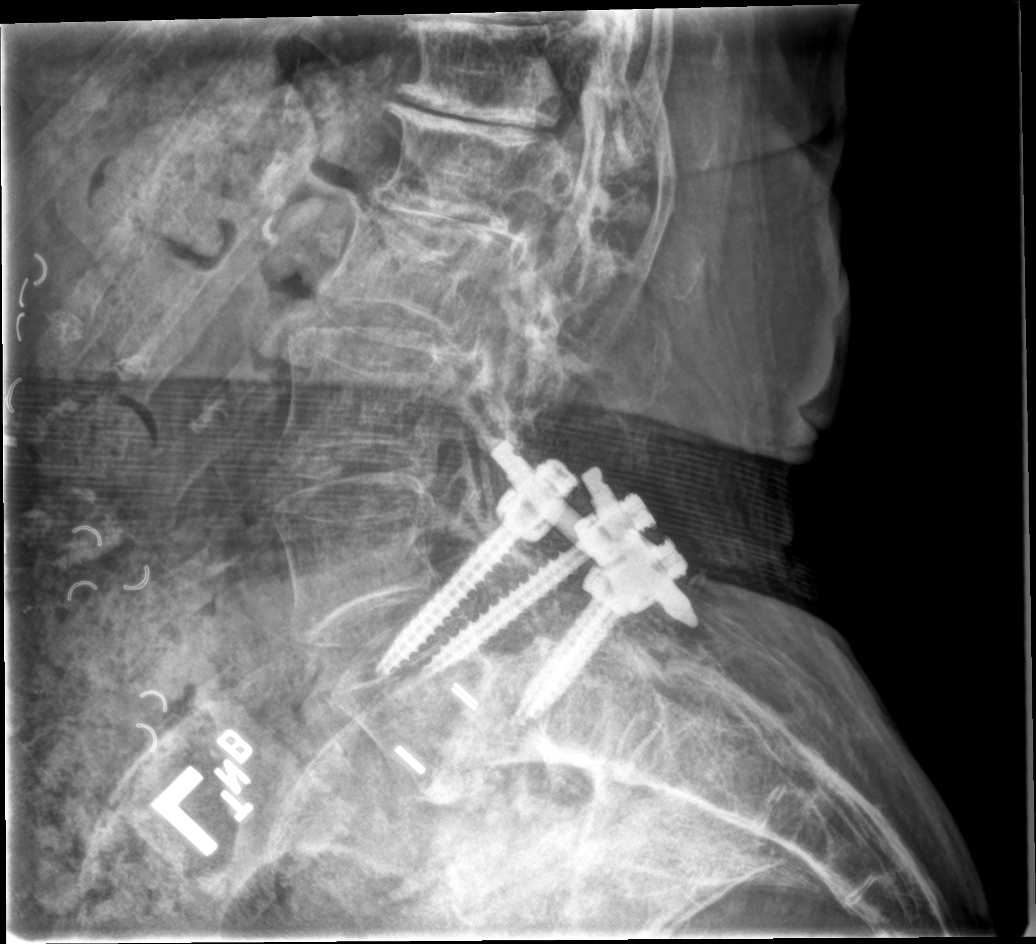

[6 of 6 positions shown; findings below may reference images not displayed]

FINDINGS: There are 5 non rib-bearing lumbar type vertebrae. Mild to moderate
upper lumbar levoscoliosis appears increased. There is fused grade 2
anterolisthesis of L5 on S1. Bilateral pedicle screws and an
interbody implant remain in place at L5-S1. The bones are diffusely
osteopenic. No acute fracture is identified. There is a chronic
superior endplate compression fracture/Schmorl's node deformity at
L2. Advanced disc space narrowing is again seen throughout the
included lower thoracic spine as well as at T12-L1 and L1-2 with
associated degenerative endplate sclerosis and spurring.
Postsurgical changes are noted in the abdomen.
IMPRESSION: 1. No acute osseous abnormality identified.
2. Increased lumbar levoscoliosis.
3. Similar appearance of postsurgical and degenerative changes.

## 2021-12-26 ENCOUNTER — Other Ambulatory Visit: Payer: Self-pay | Admitting: Family Medicine

## 2021-12-26 DIAGNOSIS — M1712 Unilateral primary osteoarthritis, left knee: Secondary | ICD-10-CM

## 2021-12-28 ENCOUNTER — Other Ambulatory Visit: Payer: Self-pay | Admitting: Family Medicine

## 2021-12-28 MED ORDER — HYDROCODONE-ACETAMINOPHEN 5-325 MG PO TABS: 1.0000 | ORAL_TABLET | Freq: Four times a day (QID) | ORAL | 0 refills | Status: DC | PRN

## 2021-12-28 MED ORDER — HYDROCODONE-ACETAMINOPHEN 7.5-325 MG PO TABS: 1.0000 | ORAL_TABLET | Freq: Four times a day (QID) | ORAL | 0 refills | Status: DC | PRN

## 2021-12-28 NOTE — Telephone Encounter (Signed)
LOV: 06/30/2021  NOV:  None  Last Refill:  09/28/2021 #360 0 Refill   THanks,   Mickel Baas

## 2021-12-29 ENCOUNTER — Telehealth: Payer: Self-pay | Admitting: *Deleted

## 2021-12-29 NOTE — Telephone Encounter (Signed)
Yes, she alternates between the two doses

## 2021-12-29 NOTE — Telephone Encounter (Signed)
[place] is calling to report Controlled medication alert Patient has 2 Rx for hydrocodone: 1/25- #360 sent to [facility] 1/26-#360 sent to [facility] Please verify that provider wants this filled- may call back at [phone]

## 2021-12-31 ENCOUNTER — Other Ambulatory Visit: Payer: Self-pay | Admitting: Family Medicine

## 2022-01-03 ENCOUNTER — Other Ambulatory Visit: Payer: Self-pay | Admitting: Family Medicine

## 2022-01-03 DIAGNOSIS — E782 Mixed hyperlipidemia: Secondary | ICD-10-CM

## 2022-01-03 MED ORDER — CARISOPRODOL 350 MG PO TABS: 350.0000 mg | ORAL_TABLET | Freq: Four times a day (QID) | ORAL | 1 refills | Status: DC | PRN

## 2022-02-01 ENCOUNTER — Telehealth: Payer: Self-pay | Admitting: Family Medicine

## 2022-02-01 NOTE — Telephone Encounter (Signed)
Julia Salazar with [facility] called saying she needs approval HH PT  2 week 3 and 1 week 1  CB@  [phone]

## 2022-02-02 NOTE — Telephone Encounter (Signed)
I returned [name]'s call and advised her of Dr. Maralyn Sago response below.  ?

## 2022-02-02 NOTE — Telephone Encounter (Signed)
That's fine

## 2022-02-04 ENCOUNTER — Telehealth: Payer: Self-pay | Admitting: *Deleted

## 2022-02-04 ENCOUNTER — Encounter: Payer: Self-pay | Admitting: *Deleted

## 2022-02-04 NOTE — Telephone Encounter (Signed)
This encounter was created in error - please disregard.

## 2022-02-04 NOTE — Telephone Encounter (Signed)
Julia Salazar, PT from [facility] called to inform that pt fell last night. No injuries reported, daughter found on floor this morning. Pt states that she just has her usual pain and she has no new injury. PT just reporting. ?

## 2022-02-04 NOTE — Telephone Encounter (Signed)
Contacted emergency contact who is patients daughter, she states that patients fall occurred yesterday afternoon at 3:30PM in her home. Patient was changing out of her gown and when she stepped out of gown she slipped and fell on hard wood floor landing on her behind. Daughter denies any blood loss, complaints of pain or confusion. She states that she sat with patient yesterday and her sister was with patient for several hours today and reports that patient behavior was normal and she did not complain of pain. I suggested an office visit for evaluation for fall she states that patient will not come to office and they will monitor her condition if anything changes. KW ?

## 2022-02-09 ENCOUNTER — Telehealth: Payer: Self-pay

## 2022-02-09 NOTE — Telephone Encounter (Signed)
Julia Salazar from [facility] wanted to report fall last night, pt was walking out of bedroom and R knee buckled. No swelling or bruising noted. Pt has taken Norco for pain and using a knee brace and denies wanting to come in for an appt. Advised if symptoms worsen to call back.  ?

## 2022-02-15 ENCOUNTER — Other Ambulatory Visit: Payer: Self-pay | Admitting: Family Medicine

## 2022-02-23 DIAGNOSIS — M2550 Pain in unspecified joint: Secondary | ICD-10-CM | POA: Diagnosis not present

## 2022-02-23 DIAGNOSIS — I10 Essential (primary) hypertension: Secondary | ICD-10-CM

## 2022-02-23 DIAGNOSIS — R262 Difficulty in walking, not elsewhere classified: Secondary | ICD-10-CM

## 2022-02-23 DIAGNOSIS — M545 Low back pain, unspecified: Secondary | ICD-10-CM | POA: Diagnosis not present

## 2022-02-23 DIAGNOSIS — Z8673 Personal history of transient ischemic attack (TIA), and cerebral infarction without residual deficits: Secondary | ICD-10-CM

## 2022-02-23 DIAGNOSIS — M159 Polyosteoarthritis, unspecified: Secondary | ICD-10-CM | POA: Diagnosis not present

## 2022-02-23 DIAGNOSIS — M5416 Radiculopathy, lumbar region: Secondary | ICD-10-CM | POA: Diagnosis not present

## 2022-02-28 ENCOUNTER — Ambulatory Visit (INDEPENDENT_AMBULATORY_CARE_PROVIDER_SITE_OTHER): Payer: Medicare Other

## 2022-02-28 VITALS — Ht 59.0 in | Wt 114.0 lb

## 2022-02-28 DIAGNOSIS — Z0000 Encounter for general adult medical examination without abnormal findings: Secondary | ICD-10-CM | POA: Diagnosis not present

## 2022-02-28 NOTE — Progress Notes (Signed)
?Virtual Visit via Telephone Note ? ?I connected with  Julia Salazar on 02/28/22 at 10:30 AM EDT by telephone and verified that I am speaking with the correct person using two identifiers. ? ?Location: ?Patient: home ?Provider: [facility] ?[name]/Nurse Health Advisor ?  ?I discussed the limitations, risks, security and privacy concerns of performing an evaluation and management service by telephone and the availability of in person appointments. The patient expressed understanding and agreed to proceed. ? ?Interactive audio and video telecommunications were attempted between this nurse and patient, however failed, due to patient having technical difficulties OR patient did not have access to video capability.  We continued and completed visit with audio only. ? ?Some vital signs may be absent or patient reported.  ? ?Julia David, LPN ? ?Subjective:  ? Julia Salazar is a 88 y.o. female who presents for Medicare Annual (Subsequent) preventive examination. ? ?Review of Systems    ? ?  ? ?   ?Objective:  ?  ?Today's Vitals  ? 02/28/22 1038  ?Weight: 114 lb (51.7 kg)  ?Height: '4\' 11"'$  (1.499 m)  ? ?Body mass index is 23.03 kg/m?. ? ? ?  02/22/2021  ?  9:41 AM 02/18/2020  ? 10:54 AM 02/12/2019  ?  8:55 AM 02/08/2018  ?  8:40 AM 10/05/2016  ?  1:16 PM  ?Advanced Directives  ?Does Patient Have a Medical Advance Directive? Yes Yes Yes Yes Yes  ?Type of Paramedic of [place];Living will [place];Living will [employer];Living will [place];Living will;Out of facility DNR (pink MOST or yellow form) [place];Living will  ?Copy of [place] in Chart? Yes - validated most recent copy scanned in chart (See row information) Yes - validated most recent copy scanned in chart (See row information) Yes - validated most recent copy scanned in chart (See row information) Yes Yes   ? ? ?Current Medications (verified) ?Outpatient Encounter Medications as of 02/28/2022  ?Medication Sig  ? amLODipine (NORVASC) 5 MG tablet TAKE 1 TABLET DAILY  ? aspirin 81 MG tablet Take 1 tablet by mouth daily.  ? brimonidine-timolol (COMBIGAN) 0.2-0.5 % ophthalmic solution Place 1 drop into both eyes every 12 (twelve) hours.  ? Calcium Carb-Cholecalciferol 600-200 MG-UNIT TABS Take 1 tablet by mouth daily.  ? candesartan (ATACAND) 8 MG tablet TAKE 1 TABLET BY MOUTH EVERY DAY  ? carisoprodol (SOMA) 350 MG tablet Take 1 tablet (350 mg total) by mouth every 6 (six) hours as needed for muscle spasms.  ? cholestyramine light (PREVALITE) 4 GM/DOSE powder MIX 2 GRAMS WITH FLUIDS AND DRINK EVERY 2-3 DAYS AS NEEDED FOR LOOSE BOWELS (Patient taking differently: MIX 2 GRAMS WITH FLUIDS AND DRINK EVERY 2-3 DAYS AS NEEDED FOR LOOSE BOWELS)  ? diclofenac Sodium (VOLTAREN) 1 % GEL Apply topically 4 (four) times daily.  ? diphenhydrAMINE (BENADRYL) 25 mg capsule Take 25 mg by mouth at bedtime.   ? diphenoxylate-atropine (LOMOTIL) 2.5-0.025 MG tablet Take 1 tablet by mouth 4 (four) times daily as needed.  ? FLUoxetine (PROZAC) 20 MG capsule TAKE 1 CAPSULE BY MOUTH EVERY DAY  ? furosemide (LASIX) 40 MG tablet TAKE 1 TABLET DAILY AS     NEEDED FOR SWELLING  ? hydrochlorothiazide (HYDRODIURIL) 12.5 MG tablet TAKE 1 TABLET DAILY  ? HYDROcodone-acetaminophen (NORCO) 7.5-325 MG tablet Take 1 tablet by mouth 4 (four) times daily as needed for moderate pain.  ?  HYDROcodone-acetaminophen (NORCO/VICODIN) 5-325 MG tablet Take 1 tablet by mouth every 6 (six) hours as needed for moderate pain.  ? meloxicam (MOBIC) 15 MG tablet Take 1 tablet (15 mg total) by mouth daily as needed.  ? Multiple Vitamins-Minerals (MULTIVITAMIN ADULT PO) Take 1 tablet by mouth daily.  ? omega-3 acid ethyl esters (LOVAZA) 1 g capsule TAKE 4 CAPSULES DAILY  ? oxybutynin (DITROPAN-XL) 5 MG 24 hr tablet TAKE 1 TABLET BY MOUTH EVERYDAY AT BEDTIME  ? pravastatin  (PRAVACHOL) 40 MG tablet TAKE 1 TABLET DAILY  ? prednisoLONE acetate (PRED FORTE) 1 % ophthalmic suspension Place 1 drop into the left eye daily.   ? ?No facility-administered encounter medications on file as of 02/28/2022.  ? ? ?Allergies (verified) ?Lovastatin, Sertraline hcl, and Etodolac  ? ?History: ?Past Medical History:  ?Diagnosis Date  ? Depression   ? Diverticulosis   ? History of chicken pox   ? History of measles   ? History of mumps   ? Hyperlipidemia   ? Hypertension   ? Ovarian cancer ([place]) 1995  ? ?Past Surgical History:  ?Procedure Laterality Date  ? ABDOMINAL HYSTERECTOMY    ? 1973  ? APPENDECTOMY  1986  ? BACK SURGERY  05/2012  ? done at [facility] in [place]  ? BILATERAL SALPINGOOPHORECTOMY  1986  ? CATARACT EXTRACTION  2010  ? right eye  ? CHOLECYSTECTOMY  1998  ? COLON RESECTION  1995  ? CORNEAL TRANSPLANT    ? DESCEMETS STRIPPING AUTOMATED ENDOTHELIAL KERATOPLASTY Left 08/30/2018  ? Alvie Heidelberg, MD [facility]  ? OOPHORECTOMY    ? ?Family History  ?Problem Relation Age of Onset  ? Colon cancer Mother   ? Heart attack Father   ? Breast cancer Sister 61  ? Leukemia Sister   ? Hodgkin's lymphoma Sister   ? ?Social History  ? ?Socioeconomic History  ? Marital status: Widowed  ?  Spouse name: Not on file  ? Number of children: 3  ? Years of education: LPN  ? Highest education level: Associate degree: occupational, Hotel manager, or vocational program  ?Occupational History  ? Occupation: retired  ?Tobacco Use  ? Smoking status: Never  ? Smokeless tobacco: Never  ?Vaping Use  ? Vaping Use: Never used  ?Substance and Sexual Activity  ? Alcohol use: No  ?  Alcohol/week: 0.0 standard drinks  ? Drug use: No  ? Sexual activity: Not on file  ?Other Topics Concern  ? Not on file  ?Social History Narrative  ? Not on file  ? ?Social Determinants of Health  ? ?Financial Resource Strain: Not on file  ?Food Insecurity: Not on file  ?Transportation Needs: Not on file  ?Physical Activity: Not on file  ?Stress: Not on  file  ?Social Connections: Not on file  ? ? ?Tobacco Counseling ?Counseling given: Not Answered ? ? ?Clinical Intake: ? ?Pre-visit preparation completed: Yes ? ?Pain : No/denies pain ? ?  ? ?Nutritional Risks: None ?Diabetes: No ? ?How often do you need to have someone help you when you read instructions, pamphlets, or other written materials from your doctor or pharmacy?: 1 - Never ? ?Diabetic?no ? ?Interpreter Needed?: No ? ?Information entered by :: Kirke Shaggy, LPN ? ? ?Activities of Daily Living ?   ? View : No data to display.  ?  ?  ?  ? ? ?Patient Care Team: ?Birdie Sons, MD as PCP - General (Family Medicine) ?Oneta Rack, MD (Dermatology) ?Lorelee Cover., MD as  Consulting Physician (Ophthalmology) ?Conception Chancy, DDS as Referring Physician (Dentistry) ?Earnestine Leys, MD (Orthopedic Surgery) ? ?Indicate any recent Medical Services you may have received from other than [facility] providers in the past year (date may be approximate). ? ?   ?Assessment:  ? This is a routine wellness examination for Jon. ? ?Hearing/Vision screen ?No results found. ? ?Dietary issues and exercise activities discussed: ?  ? ? Goals Addressed   ?None ?  ? ?Depression Screen ? ?  02/22/2021  ?  9:46 AM 10/07/2020  ? 10:07 AM 02/18/2020  ? 10:50 AM 02/12/2019  ?  9:07 AM 02/12/2019  ?  8:55 AM 02/08/2018  ?  8:45 AM 02/08/2018  ?  8:42 AM  ?PHQ 2/9 Scores  ?PHQ - 2 Score 0 0 0 0 0 0 0  ?PHQ- 9 Score    1  1   ?  ?Fall Risk ? ?  02/22/2021  ?  9:42 AM 10/07/2020  ? 10:10 AM 02/18/2020  ? 10:54 AM 02/04/2020  ? 11:07 AM 02/12/2019  ?  8:55 AM  ?Fall Risk   ?Falls in the past year? '1 1 1 '$ 0 0  ?Number falls in past yr: 1 1 0 0   ?Injury with Fall? 0 1 0 0   ?Risk for fall due to : Impaired balance/gait History of fall(s) Impaired balance/gait    ?Follow up Falls prevention discussed Falls prevention discussed Falls prevention discussed Falls evaluation completed   ?Comment Currently in PT for balance issues.      ? ? ?FALL RISK PREVENTION  PERTAINING TO THE HOME: ? ?Any stairs in or around the home? No  ?If so, are there any without handrails? No  ?Home free of loose throw rugs in walkways, pet beds, electrical cords, etc? Yes  ?Adequate lighting

## 2022-02-28 NOTE — Patient Instructions (Addendum)
Ms. Dolney , ?Thank you for taking time to come for your Medicare Wellness Visit. I appreciate your ongoing commitment to your health goals. Please review the following plan we discussed and let me know if I can assist you in the future.  ? ?Screening recommendations/referrals: ?Colonoscopy: aged out ?Mammogram: aged out ?Bone Density: aged out ?Recommended yearly ophthalmology/optometry visit for glaucoma screening and checkup ?Recommended yearly dental visit for hygiene and checkup ? ?Vaccinations: ?Influenza vaccine: 09/20/21 ?Pneumococcal vaccine: 11/03/14 ?Tdap vaccine: 11/03/14   ?Shingles vaccine: Zostavax 11/07/13   ?Covid-19:12/18/19, 01/15/20, 10/22/20, 04/23/21, 09/20/21 ? ?Advanced directives: no ? ?Conditions/risks identified: none ? ?Next appointment: Follow up in one year for your annual wellness visit - 03/02/23 @ 9:45am by phone ? ? ?Preventive Care 86 Years and Older, Female ?Preventive care refers to lifestyle choices and visits with your health care provider that can promote health and wellness. ?What does preventive care include? ?A yearly physical exam. This is also called an annual well check. ?Dental exams once or twice a year. ?Routine eye exams. Ask your health care provider how often you should have your eyes checked. ?Personal lifestyle choices, including: ?Daily care of your teeth and gums. ?Regular physical activity. ?Eating a healthy diet. ?Avoiding tobacco and drug use. ?Limiting alcohol use. ?Practicing safe sex. ?Taking low-dose aspirin every day. ?Taking vitamin and mineral supplements as recommended by your health care provider. ?What happens during an annual well check? ?The services and screenings done by your health care provider during your annual well check will depend on your age, overall health, lifestyle risk factors, and family history of disease. ?Counseling  ?Your health care provider may ask you questions about your: ?Alcohol use. ?Tobacco use. ?Drug use. ?Emotional  well-being. ?Home and relationship well-being. ?Sexual activity. ?Eating habits. ?History of falls. ?Memory and ability to understand (cognition). ?Work and work Statistician. ?Reproductive health. ?Screening  ?You may have the following tests or measurements: ?Height, weight, and BMI. ?Blood pressure. ?Lipid and cholesterol levels. These may be checked every 5 years, or more frequently if you are over 67 years old. ?Skin check. ?Lung cancer screening. You may have this screening every year starting at age 89 if you have a 30-pack-year history of smoking and currently smoke or have quit within the past 15 years. ?Fecal occult blood test (FOBT) of the stool. You may have this test every year starting at age 38. ?Flexible sigmoidoscopy or colonoscopy. You may have a sigmoidoscopy every 5 years or a colonoscopy every 10 years starting at age 30. ?Hepatitis C blood test. ?Hepatitis B blood test. ?Sexually transmitted disease (STD) testing. ?Diabetes screening. This is done by checking your blood sugar (glucose) after you have not eaten for a while (fasting). You may have this done every 1-3 years. ?Bone density scan. This is done to screen for osteoporosis. You may have this done starting at age 60. ?Mammogram. This may be done every 1-2 years. Talk to your health care provider about how often you should have regular mammograms. ?Talk with your health care provider about your test results, treatment options, and if necessary, the need for more tests. ?Vaccines  ?Your health care provider may recommend certain vaccines, such as: ?Influenza vaccine. This is recommended every year. ?Tetanus, diphtheria, and acellular pertussis (Tdap, Td) vaccine. You may need a Td booster every 10 years. ?Zoster vaccine. You may need this after age 56. ?Pneumococcal 13-valent conjugate (PCV13) vaccine. One dose is recommended after age 76. ?Pneumococcal polysaccharide (PPSV23) vaccine. One dose is recommended  after age 84. ?Talk to your  health care provider about which screenings and vaccines you need and how often you need them. ?This information is not intended to replace advice given to you by your health care provider. Make sure you discuss any questions you have with your health care provider. ?Document Released: 12/18/2015 Document Revised: 08/10/2016 Document Reviewed: 09/22/2015 ?Elsevier Interactive Patient Education ? 2017 [employer]. ? ?Fall Prevention in the Home ?Falls can cause injuries. They can happen to people of all ages. There are many things you can do to make your home safe and to help prevent falls. ?What can I do on the outside of my home? ?Regularly fix the edges of walkways and driveways and fix any cracks. ?Remove anything that might make you trip as you walk through a door, such as a raised step or threshold. ?Trim any bushes or trees on the path to your home. ?Use bright outdoor lighting. ?Clear any walking paths of anything that might make someone trip, such as rocks or tools. ?Regularly check to see if handrails are loose or broken. Make sure that both sides of any steps have handrails. ?Any raised decks and porches should have guardrails on the edges. ?Have any leaves, snow, or ice cleared regularly. ?Use sand or salt on walking paths during winter. ?Clean up any spills in your garage right away. This includes oil or grease spills. ?What can I do in the bathroom? ?Use night lights. ?Install grab bars by the toilet and in the tub and shower. Do not use towel bars as grab bars. ?Use non-skid mats or decals in the tub or shower. ?If you need to sit down in the shower, use a plastic, non-slip stool. ?Keep the floor dry. Clean up any water that spills on the floor as soon as it happens. ?Remove soap buildup in the tub or shower regularly. ?Attach bath mats securely with double-sided non-slip rug tape. ?Do not have throw rugs and other things on the floor that can make you trip. ?What can I do in the bedroom? ?Use night  lights. ?Make sure that you have a light by your bed that is easy to reach. ?Do not use any sheets or blankets that are too big for your bed. They should not hang down onto the floor. ?Have a firm chair that has side arms. You can use this for support while you get dressed. ?Do not have throw rugs and other things on the floor that can make you trip. ?What can I do in the kitchen? ?Clean up any spills right away. ?Avoid walking on wet floors. ?Keep items that you use a lot in easy-to-reach places. ?If you need to reach something above you, use a strong step stool that has a grab bar. ?Keep electrical cords out of the way. ?Do not use floor polish or wax that makes floors slippery. If you must use wax, use non-skid floor wax. ?Do not have throw rugs and other things on the floor that can make you trip. ?What can I do with my stairs? ?Do not leave any items on the stairs. ?Make sure that there are handrails on both sides of the stairs and use them. Fix handrails that are broken or loose. Make sure that handrails are as long as the stairways. ?Check any carpeting to make sure that it is firmly attached to the stairs. Fix any carpet that is loose or worn. ?Avoid having throw rugs at the top or bottom of the stairs. If you  do have throw rugs, attach them to the floor with carpet tape. ?Make sure that you have a light switch at the top of the stairs and the bottom of the stairs. If you do not have them, ask someone to add them for you. ?What else can I do to help prevent falls? ?Wear shoes that: ?Do not have high heels. ?Have rubber bottoms. ?Are comfortable and fit you well. ?Are closed at the toe. Do not wear sandals. ?If you use a stepladder: ?Make sure that it is fully opened. Do not climb a closed stepladder. ?Make sure that both sides of the stepladder are locked into place. ?Ask someone to hold it for you, if possible. ?Clearly mark and make sure that you can see: ?Any grab bars or handrails. ?First and last  steps. ?Where the edge of each step is. ?Use tools that help you move around (mobility aids) if they are needed. These include: ?Canes. ?Walkers. ?Scooters. ?Crutches. ?Turn on the lights when you go into a dark area. R

## 2022-03-07 ENCOUNTER — Other Ambulatory Visit: Payer: Self-pay | Admitting: Family Medicine

## 2022-03-24 ENCOUNTER — Other Ambulatory Visit: Payer: Self-pay | Admitting: Family Medicine

## 2022-03-24 DIAGNOSIS — M1712 Unilateral primary osteoarthritis, left knee: Secondary | ICD-10-CM

## 2022-03-24 MED ORDER — HYDROCODONE-ACETAMINOPHEN 5-325 MG PO TABS: 1.0000 | ORAL_TABLET | Freq: Four times a day (QID) | ORAL | 0 refills | Status: DC | PRN

## 2022-03-24 MED ORDER — HYDROCODONE-ACETAMINOPHEN 7.5-325 MG PO TABS: 1.0000 | ORAL_TABLET | Freq: Four times a day (QID) | ORAL | 0 refills | Status: DC | PRN

## 2022-05-09 ENCOUNTER — Other Ambulatory Visit: Payer: Self-pay | Admitting: Family Medicine

## 2022-05-17 ENCOUNTER — Ambulatory Visit (INDEPENDENT_AMBULATORY_CARE_PROVIDER_SITE_OTHER): Payer: Medicare Other | Admitting: Family Medicine

## 2022-05-17 ENCOUNTER — Encounter: Payer: Self-pay | Admitting: Family Medicine

## 2022-05-17 VITALS — BP 138/80 | HR 63 | Temp 98.3°F | Resp 14 | Wt 114.0 lb

## 2022-05-17 DIAGNOSIS — S32000S Wedge compression fracture of unspecified lumbar vertebra, sequela: Secondary | ICD-10-CM | POA: Diagnosis not present

## 2022-05-17 DIAGNOSIS — M545 Low back pain, unspecified: Secondary | ICD-10-CM

## 2022-05-17 DIAGNOSIS — M1712 Unilateral primary osteoarthritis, left knee: Secondary | ICD-10-CM | POA: Diagnosis not present

## 2022-05-17 DIAGNOSIS — M47816 Spondylosis without myelopathy or radiculopathy, lumbar region: Secondary | ICD-10-CM | POA: Diagnosis not present

## 2022-05-17 DIAGNOSIS — G8929 Other chronic pain: Secondary | ICD-10-CM

## 2022-05-17 MED ORDER — FENTANYL 25 MCG/HR TD PT72: 1.0000 | MEDICATED_PATCH | TRANSDERMAL | 0 refills | Status: DC

## 2022-05-17 NOTE — Progress Notes (Signed)
I,[name],acting as a scribe for Lelon Huh, MD.,have documented all relevant documentation on the behalf of Lelon Huh, MD,as directed by  Lelon Huh, MD while in the presence of Lelon Huh, MD.  Established patient visit   Patient: Julia Salazar   DOB: July 17, 1929   88 y.o. Female  MRN: [id number] Visit Date: 05/17/2022  Today's healthcare provider: Lelon Huh, MD   No chief complaint on file. CC: knee and back pain Subjective    Back Pain  She reports chronic back pain. There was not an injury that may have caused the pain. The most recent episode started a few years ago and is worsening. The pain is located in the across the lower backwith radiation to groin. Pain is described as aching and burning, is severe in intensity, occurring intermittently. No pain with sitting.  Symptoms are worse when patient is on her feet.   Aggravating factors: standing and walking Relieving factors: sitting.  She has tried prescription pain relievers with moderate relief.  Seeing [facility] and has had multiple injections in back and knees over the last few years with only brief improvement in pain. She reports that she was prescribed butrans patch up to '10mg'$  which was not effective. Was tried on gabapentin which caused dizziness. States she typically takes the 7.'5mg'$  hydrocodone/apap three times every day and additional '5mg'$  hydrocodone/apap prn, but these are no longer effective and having increasing difficulties walking.    Medications: Outpatient Medications Prior to Visit  Medication Sig   amLODipine (NORVASC) 5 MG tablet TAKE 1 TABLET DAILY   aspirin 81 MG tablet Take 1 tablet by mouth daily.   brimonidine-timolol (COMBIGAN) 0.2-0.5 % ophthalmic solution Place 1 drop into both eyes every 12 (twelve) hours.   Calcium Carb-Cholecalciferol 600-200 MG-UNIT TABS Take 1 tablet by mouth daily.   candesartan (ATACAND) 8 MG tablet TAKE 1 TABLET BY MOUTH EVERY DAY   carisoprodol (SOMA)  350 MG tablet Take 1 tablet (350 mg total) by mouth every 6 (six) hours as needed for muscle spasms.   cholestyramine light (PREVALITE) 4 GM/DOSE powder MIX 2 GRAMS WITH FLUIDS AND DRINK EVERY 2-3 DAYS AS NEEDED FOR LOOSE BOWELS (Patient taking differently: MIX 2 GRAMS WITH FLUIDS AND DRINK EVERY 2-3 DAYS AS NEEDED FOR LOOSE BOWELS)   diclofenac Sodium (VOLTAREN) 1 % GEL Apply topically 4 (four) times daily.   diphenhydrAMINE (BENADRYL) 25 mg capsule Take 25 mg by mouth at bedtime.    diphenoxylate-atropine (LOMOTIL) 2.5-0.025 MG tablet Take 1 tablet by mouth 4 (four) times daily as needed.   FLUoxetine (PROZAC) 20 MG capsule TAKE 1 CAPSULE BY MOUTH EVERY DAY   furosemide (LASIX) 40 MG tablet TAKE 1 TABLET DAILY AS     NEEDED FOR SWELLING   hydrochlorothiazide (HYDRODIURIL) 12.5 MG tablet TAKE 1 TABLET DAILY   HYDROcodone-acetaminophen (NORCO) 7.5-325 MG tablet Take 1 tablet by mouth 4 (four) times daily as needed for moderate pain.   HYDROcodone-acetaminophen (NORCO/VICODIN) 5-325 MG tablet Take 1 tablet by mouth every 6 (six) hours as needed for moderate pain.   meloxicam (MOBIC) 15 MG tablet TAKE 1 TABLET BY MOUTH DAILY AS NEEDED.   Multiple Vitamins-Minerals (MULTIVITAMIN ADULT PO) Take 1 tablet by mouth daily.   omega-3 acid ethyl esters (LOVAZA) 1 g capsule TAKE 4 CAPSULES DAILY   oxybutynin (DITROPAN-XL) 5 MG 24 hr tablet TAKE 1 TABLET BY MOUTH EVERYDAY AT BEDTIME   pravastatin (PRAVACHOL) 40 MG tablet TAKE 1 TABLET DAILY  prednisoLONE acetate (PRED FORTE) 1 % ophthalmic suspension Place 1 drop into the left eye daily.    No facility-administered medications prior to visit.         Objective    BP 138/80 (BP Location: Right Arm, Patient Position: Sitting, Cuff Size: Normal)   Pulse 63   Temp 98.3 F (36.8 C) (Oral)   Resp 14   Wt 114 lb (51.7 kg)   SpO2 95%   BMI 23.03 kg/m    Physical Exam  Awake, alert, oriented, sitting in wheelchair in no acute distress.      Assessment & Plan     1. Lumbar compression fracture, sequela   2. Lumbar spondylosis   3. Osteoarthritis of left knee, unspecified osteoarthritis type   4. Chronic bilateral low back pain without sciatica Has been on current opioid regiment for several year with gradually waning effectiveness. Tried multiple invasive modalities with only brief relief. Failed trial of Butrans and gabapentin.   Will try  fentaNYL ([place]) 25 MCG/HR; Place 1 patch onto the skin every 3 (three) days.  Dispense: 10 patch; Refill: 0  Counseled on potential for dependence, tolerance, and potential for opioid induced constipation.  Advised not to take hydrocodone/apap on schedule after applying patch, but could take prn so long as she is not sedated on patch.   Follow up in 1 month     The entirety of the information documented in the History of Present Illness, Review of Systems and Physical Exam were personally obtained by me. Portions of this information were initially documented by the CMA and reviewed by me for thoroughness and accuracy.     Lelon Huh, MD  [facility] [phone] (phone) [phone] (fax)  [place]

## 2022-05-22 ENCOUNTER — Encounter: Payer: Self-pay | Admitting: Family Medicine

## 2022-05-25 ENCOUNTER — Telehealth: Payer: Self-pay | Admitting: Family Medicine

## 2022-05-25 DIAGNOSIS — M47816 Spondylosis without myelopathy or radiculopathy, lumbar region: Secondary | ICD-10-CM

## 2022-05-25 DIAGNOSIS — G8929 Other chronic pain: Secondary | ICD-10-CM

## 2022-05-25 MED ORDER — FENTANYL 50 MCG/HR TD PT72: 1.0000 | MEDICATED_PATCH | TRANSDERMAL | 0 refills | Status: DC

## 2022-05-25 MED ORDER — FENTANYL 25 MCG/HR TD PT72: 1.0000 | MEDICATED_PATCH | TRANSDERMAL | 0 refills | Status: DC

## 2022-05-25 NOTE — Telephone Encounter (Signed)
Pt's daughter called and reports that the pharmacy will not provide this refill because it is the same strength. She wants to speak to the clinic about this to confirm if the prescription has been doubled. Call back request   Best contact: [phone]

## 2022-05-25 NOTE — Telephone Encounter (Signed)
Please Review

## 2022-05-25 NOTE — Telephone Encounter (Signed)
Pts daughter called to see if Dr. Caryn Section would increase the dose on the fentaNYL ([place]) 25 MCG/HR / she stated that the pt is still having to take Vicodin for break through pain/ please advise / she sent a message about this on 6.18.23 as well

## 2022-05-25 NOTE — Addendum Note (Signed)
Addended by: Birdie Sons on: 05/25/2022 02:23 PM   Modules accepted: Orders

## 2022-06-02 ENCOUNTER — Encounter: Payer: Self-pay | Admitting: Family Medicine

## 2022-06-03 NOTE — Telephone Encounter (Signed)
PA completed via covermymeds. Awaiting response from insurance.  Message from covermymeds: Your information has been submitted to [place]. To check for an updated outcome later, reopen this PA request from your dashboard.  If [employer] has not responded to your request within 24 hours, contact [place] at [phone]. If you think there may be a problem with your PA request, use our live chat feature at the bottom right.

## 2022-06-03 NOTE — Telephone Encounter (Signed)
Copied from [place]. Topic: General - Other >> Jun 02, 2022  9:59 AM Rudene Anda wrote: Reason for CRM: Pts daughter called regarding fentaNYL ([place]) 50 MCG/HR, Caller wanted to know if the PA for this Rx was completed.

## 2022-06-03 NOTE — Telephone Encounter (Signed)
Per covermymeds:   Your PA request has been approved. Additional information will be provided in the approval communication. Tech Review Queue // fentaNYL 50 MCG/HR // #10/30DS // M47.816 Spondylosis w/o myelopathy or radiculopathy, lumbar region / M54.50 Low back pain, unspecified // 75 Prior Authorization Reqrd Prior Auth Req-MD call [phone] Drug Requires Prior Authorization NON-SPECIALTY DRUG. // Approve, 6 months

## 2022-06-10 ENCOUNTER — Ambulatory Visit: Payer: Self-pay

## 2022-06-10 NOTE — Telephone Encounter (Signed)
  Chief Complaint: Pain Symptoms: pain of 8-9/10 Frequency: ongoing Pertinent Negatives: Patient denies  Disposition: '[]'$ ED /'[]'$ Urgent Care (no appt availability in office) / '[]'$ Appointment(In office/virtual)/ '[]'$  [place] [facility]/ '[]'$ Home Care/ '[]'$ Refused Recommended Disposition /'[]'$ [place] [facility]/ '[x]'$  Follow-up with PCP Additional Notes: Pt's daughter, Juliann Pulse called. Juliann Pulse states that pt's pain is 8-9/10. Pt was on 25 mg. Patches for 10 days, and then 50 mg patches for about 10 days, but is still in a lot of pain. Juliann Pulse states that her mom was in so much pain that she put her back to bed. Juliann Pulse would like an increase in patch dosage.   Please advise.   Reason for Disposition  Prescription request for new medicine (not a refill)  Answer Assessment - Initial Assessment Questions 1. DRUG NAME: "What medicine do you need to have refilled?"     Fentanyl  2. REFILLS REMAINING: "How many refills are remaining?" (Note: The label on the medicine or pill bottle will show how many refills are remaining. If there are no refills remaining, then a renewal may be needed.)      3. EXPIRATION DATE: "What is the expiration date?" (Note: The label states when the prescription will expire, and thus can no longer be refilled.)      4. Prescribing HCP: "Who prescribed it?" Reason: If prescribed by specialist, call should be referred to that group.     Dr. Caryn Section 5. SYMPTOMS: "Do you have any symptoms?"     Pain 8-9/10 6. PREGNANCY: "Is there any chance that you are pregnant?" "When was your last menstrual period?"     na  Protocols used: Medication Refill and Renewal Call-A-AH

## 2022-06-20 ENCOUNTER — Ambulatory Visit: Payer: Medicare Other | Admitting: Family Medicine

## 2022-06-21 NOTE — Progress Notes (Signed)
Established patient visit  I,Joseline E Rosas,acting as a scribe for [employer], PA-C.,have documented all relevant documentation on the behalf of Mardene Speak, PA-C,as directed by  [employer], PA-C while in the presence of [employer], PA-C.   Patient: Julia Salazar   DOB: 1929-10-14   88 y.o. Female  MRN: [id number] Visit Date: 06/22/2022  Today's healthcare provider: Mardene Speak, PA-C   Chief Complaint  Patient presents with   Knee Pain   Back Pain   Subjective    Follow up for Chronic bilateral low back pain without sciatica  The patient was last seen for this 1 months ago with Dr. Caryn Section.   Changes made at last visit include: Has been on current opioid regiment for several year with gradually waning effectiveness. Tried multiple invasive modalities with only brief relief. Failed trial of Butrans and gabapentin.  Will try  fentaNYL (DURAGESIC) 25 MCG/HR; Place 1 patch onto the skin every 3 (three) days.  Dispense: 10 patch; Refill: 0  Advised not to take hydrocodone/apap on schedule after applying patch, but could take prn so long as she is not sedated on patch.  Patient reports still having pain and she is having to take the Vicodin for breakthrough pain.  Need a refill for the Vicodin.  ----------------------------------------------------------------------------------------- Follow up for Osteoarthritis of left knee, unspecified osteoarthritis type   The patient was last seen for this 1 months ago. Changes made at last visit include. ----------------------------------------------------------------------------------------- Patient also concern about bruising more. Reports no fall.  Medications: Outpatient Medications Prior to Visit  Medication Sig   amLODipine (NORVASC) 5 MG tablet TAKE 1 TABLET DAILY   aspirin 81 MG tablet Take 1 tablet by mouth daily.   brimonidine-timolol (COMBIGAN) 0.2-0.5 % ophthalmic solution Place 1 drop into both eyes every 12  (twelve) hours.   Calcium Carb-Cholecalciferol 600-200 MG-UNIT TABS Take 1 tablet by mouth daily.   candesartan (ATACAND) 8 MG tablet TAKE 1 TABLET BY MOUTH EVERY DAY   carisoprodol (SOMA) 350 MG tablet Take 1 tablet (350 mg total) by mouth every 6 (six) hours as needed for muscle spasms.   cholestyramine light (PREVALITE) 4 GM/DOSE powder MIX 2 GRAMS WITH FLUIDS AND DRINK EVERY 2-3 DAYS AS NEEDED FOR LOOSE BOWELS (Patient taking differently: MIX 2 GRAMS WITH FLUIDS AND DRINK EVERY 2-3 DAYS AS NEEDED FOR LOOSE BOWELS)   diclofenac Sodium (VOLTAREN) 1 % GEL Apply topically 4 (four) times daily.   diphenhydrAMINE (BENADRYL) 25 mg capsule Take 25 mg by mouth at bedtime.    diphenoxylate-atropine (LOMOTIL) 2.5-0.025 MG tablet Take 1 tablet by mouth 4 (four) times daily as needed.   fentaNYL (DURAGESIC) 50 MCG/HR Place 1 patch onto the skin every 3 (three) days.   FLUoxetine (PROZAC) 20 MG capsule TAKE 1 CAPSULE BY MOUTH EVERY DAY   furosemide (LASIX) 40 MG tablet TAKE 1 TABLET DAILY AS     NEEDED FOR SWELLING   hydrochlorothiazide (HYDRODIURIL) 12.5 MG tablet TAKE 1 TABLET DAILY   HYDROcodone-acetaminophen (NORCO) 7.5-325 MG tablet Take 1 tablet by mouth 4 (four) times daily as needed for moderate pain.   HYDROcodone-acetaminophen (NORCO/VICODIN) 5-325 MG tablet Take 1 tablet by mouth every 6 (six) hours as needed for moderate pain.   meloxicam (MOBIC) 15 MG tablet TAKE 1 TABLET BY MOUTH DAILY AS NEEDED.   Multiple Vitamins-Minerals (MULTIVITAMIN ADULT PO) Take 1 tablet by mouth daily.   omega-3 acid ethyl esters (LOVAZA) 1 g capsule TAKE 4 CAPSULES DAILY  oxybutynin (DITROPAN-XL) 5 MG 24 hr tablet TAKE 1 TABLET BY MOUTH EVERYDAY AT BEDTIME   pravastatin (PRAVACHOL) 40 MG tablet TAKE 1 TABLET DAILY   prednisoLONE acetate (PRED FORTE) 1 % ophthalmic suspension Place 1 drop into the left eye daily.    No facility-administered medications prior to visit.    Review of Systems  Musculoskeletal:   Positive for arthralgias, back pain and myalgias.  Psychiatric/Behavioral:  Positive for agitation.        Objective    BP 116/71 (BP Location: Right Arm, Patient Position: Sitting, Cuff Size: Normal)   Pulse 62   Temp 97.8 F (36.6 C) (Oral)   Resp 16   Wt 112 lb (50.8 kg)   BMI 22.62 kg/m    Physical Exam Vitals reviewed.  Constitutional:      General: She is not in acute distress.    Appearance: Normal appearance. She is well-developed. She is not diaphoretic.  HENT:     Head: Normocephalic and atraumatic.  Eyes:     General: No scleral icterus.    Conjunctiva/sclera: Conjunctivae normal.  Neck:     Thyroid: No thyromegaly.  Cardiovascular:     Rate and Rhythm: Normal rate and regular rhythm.     Pulses: Normal pulses.     Heart sounds: Normal heart sounds. No murmur heard. Pulmonary:     Effort: Pulmonary effort is normal. No respiratory distress.     Breath sounds: Rales present. No wheezing or rhonchi.  Musculoskeletal:     Cervical back: Neck supple.     Right lower leg: Edema present.     Left lower leg: Edema present.  Lymphadenopathy:     Cervical: No cervical adenopathy.  Skin:    General: Skin is warm and dry.     Findings: No rash.  Neurological:     Mental Status: She is alert and oriented to person, place, and time. Mental status is at baseline.  Psychiatric:        Mood and Affect: Mood normal.        Behavior: Behavior normal.       No results found for any visits on 06/22/22.  Assessment & Plan     Leg swelling  - CBC with Differential/Platelet - Comprehensive metabolic panel - Pro b natriuretic peptide (BNP)[facility]) - TSH  Bruising  - PT and PTT  Lung field abnormal finding on examination  - CBC with Differential/Platelet - Pro b natriuretic peptide (BNP)[facility])  Osteoarthritis of left knee, unspecified osteoarthritis type Chronic low back pain, unspecified back pain  laterality, unspecified whether sciatica present Chronic bilateral low back pain without sciatica Lumbar compression fracture, sequela Lumbar spondylosis Pain in joint involving pelvic region and thigh, unspecified laterality  - HYDROcodone-acetaminophen (NORCO) 7.5-325 MG tablet; Take 3 tab daily PRN  Dispense: 42 tablet; Refill: 0  Fu as scheduled for pain control reassessment with her PCP  The patient was advised to call back or seek an in-person evaluation if the symptoms worsen or if the condition fails to improve as anticipated.  I discussed the assessment and treatment plan with the patient. The patient was provided an opportunity to ask questions and all were answered. The patient agreed with the plan and demonstrated an understanding of the instructions.  The entirety of the information documented in the History of Present Illness, Review of Systems and Physical Exam were personally obtained by me. Portions of this information were initially documented by the [place] and  reviewed by me for thoroughness and accuracy.  Portions of this note were created using dictation software and may contain typographical errors.       Total encounter time more than 30 minutes  Greater than 50% was spent in counseling and coordination of care with the patient  Elberta Leatherwood  [facility] [phone] (phone) [phone] (fax)  [place]

## 2022-06-22 ENCOUNTER — Encounter: Payer: Self-pay | Admitting: Physician Assistant

## 2022-06-22 ENCOUNTER — Ambulatory Visit (INDEPENDENT_AMBULATORY_CARE_PROVIDER_SITE_OTHER): Payer: Medicare Other | Admitting: Physician Assistant

## 2022-06-22 VITALS — BP 116/71 | HR 62 | Temp 97.8°F | Resp 16 | Wt 112.0 lb

## 2022-06-22 DIAGNOSIS — R918 Other nonspecific abnormal finding of lung field: Secondary | ICD-10-CM

## 2022-06-22 DIAGNOSIS — M1712 Unilateral primary osteoarthritis, left knee: Secondary | ICD-10-CM

## 2022-06-22 DIAGNOSIS — T148XXA Other injury of unspecified body region, initial encounter: Secondary | ICD-10-CM

## 2022-06-22 DIAGNOSIS — G8929 Other chronic pain: Secondary | ICD-10-CM

## 2022-06-22 DIAGNOSIS — M7989 Other specified soft tissue disorders: Secondary | ICD-10-CM

## 2022-06-22 DIAGNOSIS — M47816 Spondylosis without myelopathy or radiculopathy, lumbar region: Secondary | ICD-10-CM

## 2022-06-22 DIAGNOSIS — M545 Low back pain, unspecified: Secondary | ICD-10-CM

## 2022-06-22 DIAGNOSIS — M25559 Pain in unspecified hip: Secondary | ICD-10-CM

## 2022-06-22 DIAGNOSIS — S32000S Wedge compression fracture of unspecified lumbar vertebra, sequela: Secondary | ICD-10-CM

## 2022-06-22 MED ORDER — HYDROCODONE-ACETAMINOPHEN 7.5-325 MG PO TABS: ORAL_TABLET | ORAL | 0 refills | Status: DC

## 2022-06-23 LAB — PT AND PTT
INR: 1 (ref 0.9–1.2)
Prothrombin Time: 10.6 s (ref 9.1–12.0)
aPTT: 25 s (ref 24–33)

## 2022-06-23 LAB — CBC WITH DIFFERENTIAL/PLATELET
Basophils Absolute: 0 10*3/uL (ref 0.0–0.2)
Basos: 0 %
EOS (ABSOLUTE): 0.4 10*3/uL (ref 0.0–0.4)
Eos: 4 %
Hematocrit: 37.2 % (ref 34.0–46.6)
Hemoglobin: 12.6 g/dL (ref 11.1–15.9)
Immature Grans (Abs): 0.1 10*3/uL (ref 0.0–0.1)
Immature Granulocytes: 1 %
Lymphocytes Absolute: 3.3 10*3/uL — ABNORMAL HIGH (ref 0.7–3.1)
Lymphs: 31 %
MCH: 31.7 pg (ref 26.6–33.0)
MCHC: 33.9 g/dL (ref 31.5–35.7)
MCV: 94 fL (ref 79–97)
Monocytes Absolute: 0.8 10*3/uL (ref 0.1–0.9)
Monocytes: 8 %
Neutrophils Absolute: 6.2 10*3/uL (ref 1.4–7.0)
Neutrophils: 56 %
Platelets: 289 10*3/uL (ref 150–450)
RBC: 3.97 x10E6/uL (ref 3.77–5.28)
RDW: 12.7 % (ref 11.7–15.4)
WBC: 10.9 10*3/uL — ABNORMAL HIGH (ref 3.4–10.8)

## 2022-06-23 LAB — COMPREHENSIVE METABOLIC PANEL
ALT: 7 IU/L (ref 0–32)
AST: 18 IU/L (ref 0–40)
Albumin/Globulin Ratio: 1.4 (ref 1.2–2.2)
Albumin: 4 g/dL (ref 3.6–4.6)
Alkaline Phosphatase: 64 IU/L (ref 44–121)
BUN/Creatinine Ratio: 41 — ABNORMAL HIGH (ref 12–28)
BUN: 52 mg/dL — ABNORMAL HIGH (ref 10–36)
Bilirubin Total: <0.2 mg/dL (ref 0.0–1.2)
CO2: 26 mmol/L (ref 20–29)
Calcium: 9.8 mg/dL (ref 8.7–10.3)
Chloride: 100 mmol/L (ref 96–106)
Creatinine, Ser: 1.28 mg/dL — ABNORMAL HIGH (ref 0.57–1.00)
Globulin, Total: 2.9 g/dL (ref 1.5–4.5)
Glucose: 95 mg/dL (ref 70–99)
Potassium: 3.9 mmol/L (ref 3.5–5.2)
Sodium: 141 mmol/L (ref 134–144)
Total Protein: 6.9 g/dL (ref 6.0–8.5)
eGFR: 40 mL/min/{1.73_m2} — ABNORMAL LOW (ref >59)

## 2022-06-23 LAB — TSH: TSH: 4.95 u[IU]/mL — ABNORMAL HIGH (ref 0.450–4.500)

## 2022-06-23 LAB — PRO B NATRIURETIC PEPTIDE: NT-Pro BNP: 271 pg/mL (ref 0–738)

## 2022-06-23 NOTE — Progress Notes (Signed)
Hello Julia Salazar ,   Your labwork results all are within normal limits/stable for you Potassium levels improved.  Kidney functions have improved a bit. Continue to drink more water.  White blood cell count is less high than 11 mo ago, could be due to stress and pain.  Rest of labs are good. Continue current medications.  Please, see your PCP for FU Any questions please reach out to the office or message me on MyChart!  Best, Mardene Speak, PA-C

## 2022-06-26 ENCOUNTER — Other Ambulatory Visit: Payer: Self-pay | Admitting: Family Medicine

## 2022-06-26 DIAGNOSIS — M545 Low back pain, unspecified: Secondary | ICD-10-CM

## 2022-06-26 DIAGNOSIS — G8929 Other chronic pain: Secondary | ICD-10-CM

## 2022-06-26 DIAGNOSIS — M47816 Spondylosis without myelopathy or radiculopathy, lumbar region: Secondary | ICD-10-CM

## 2022-06-29 ENCOUNTER — Other Ambulatory Visit: Payer: Self-pay | Admitting: Family Medicine

## 2022-06-29 DIAGNOSIS — M545 Low back pain, unspecified: Secondary | ICD-10-CM

## 2022-06-29 DIAGNOSIS — M47816 Spondylosis without myelopathy or radiculopathy, lumbar region: Secondary | ICD-10-CM

## 2022-06-29 NOTE — Telephone Encounter (Signed)
Please review.  KP

## 2022-06-30 MED ORDER — FENTANYL 50 MCG/HR TD PT72: 1.0000 | MEDICATED_PATCH | TRANSDERMAL | 0 refills | Status: DC

## 2022-06-30 NOTE — Telephone Encounter (Signed)
Pt informed and states she is out and is due to apply a new patch on Saturday.  Please advise.

## 2022-06-30 NOTE — Telephone Encounter (Signed)
Pts daughter called to request refill  for fentaNYL ([place]) 50 MCG/HR / please advise asap / pt needs before the weekend /

## 2022-07-10 ENCOUNTER — Encounter: Payer: Self-pay | Admitting: Family Medicine

## 2022-07-11 ENCOUNTER — Ambulatory Visit (INDEPENDENT_AMBULATORY_CARE_PROVIDER_SITE_OTHER): Payer: Medicare Other | Admitting: Family Medicine

## 2022-07-11 ENCOUNTER — Telehealth: Payer: Self-pay | Admitting: Family Medicine

## 2022-07-11 ENCOUNTER — Encounter: Payer: Self-pay | Admitting: Family Medicine

## 2022-07-11 DIAGNOSIS — M1712 Unilateral primary osteoarthritis, left knee: Secondary | ICD-10-CM | POA: Diagnosis not present

## 2022-07-11 MED ORDER — HYDROCODONE-ACETAMINOPHEN 7.5-325 MG PO TABS: 1.0000 | ORAL_TABLET | Freq: Four times a day (QID) | ORAL | 0 refills | Status: DC | PRN

## 2022-07-11 MED ORDER — HYDROCODONE-ACETAMINOPHEN 5-325 MG PO TABS: 1.0000 | ORAL_TABLET | Freq: Three times a day (TID) | ORAL | 0 refills | Status: DC | PRN

## 2022-07-11 NOTE — Progress Notes (Signed)
I,[name],acting as a scribe for Lelon Huh, MD.,have documented all relevant documentation on the behalf of Lelon Huh, MD,as directed by  Lelon Huh, MD while in the presence of Lelon Huh, MD.    Established patient visit   Patient: Julia Salazar   DOB: [birth year]   88 y.o. Female  MRN: [id number] Visit Date: 07/11/2022  Today's healthcare provider: Lelon Huh, MD   Chief Complaint  Patient presents with   Medication Refill   Subjective    HPI  Patient is a 88 year old female who presents today for follow up in order to get her medication refilled. Patient reports she was crying when leaving last visit on 7/19 due to her hydrocodone/apap 7.'5mg'$  being changed from QID to TID and the 5/325 were not refilled States pain has been much worse due to this and not sleeping well due to not having enough to take at night. She was started on Fentanyl 25 In June with goal of being able to reduce hydrocodone/apap over time. Since the fentanyl was increased to '50mg'$  which feels has helped much more.   Medications: Outpatient Medications Prior to Visit  Medication Sig   amLODipine (NORVASC) 5 MG tablet TAKE 1 TABLET DAILY   aspirin 81 MG tablet Take 1 tablet by mouth daily.   brimonidine-timolol (COMBIGAN) 0.2-0.5 % ophthalmic solution Place 1 drop into both eyes every 12 (twelve) hours.   Calcium Carb-Cholecalciferol 600-200 MG-UNIT TABS Take 1 tablet by mouth daily.   candesartan (ATACAND) 8 MG tablet TAKE 1 TABLET BY MOUTH EVERY DAY   carisoprodol (SOMA) 350 MG tablet Take 1 tablet (350 mg total) by mouth every 6 (six) hours as needed for muscle spasms.   cholestyramine light (PREVALITE) 4 GM/DOSE powder MIX 2 GRAMS WITH FLUIDS AND DRINK EVERY 2-3 DAYS AS NEEDED FOR LOOSE BOWELS (Patient taking differently: MIX 2 GRAMS WITH FLUIDS AND DRINK EVERY 2-3 DAYS AS NEEDED FOR LOOSE BOWELS)   diclofenac Sodium (VOLTAREN) 1 % GEL Apply topically 4 (four) times daily.    diphenhydrAMINE (BENADRYL) 25 mg capsule Take 25 mg by mouth at bedtime.    diphenoxylate-atropine (LOMOTIL) 2.5-0.025 MG tablet Take 1 tablet by mouth 4 (four) times daily as needed.   fentaNYL (DURAGESIC) 50 MCG/HR Place 1 patch onto the skin every 3 (three) days.   FLUoxetine (PROZAC) 20 MG capsule TAKE 1 CAPSULE BY MOUTH EVERY DAY   furosemide (LASIX) 40 MG tablet TAKE 1 TABLET DAILY AS     NEEDED FOR SWELLING   hydrochlorothiazide (HYDRODIURIL) 12.5 MG tablet TAKE 1 TABLET DAILY   HYDROcodone-acetaminophen (NORCO) 7.5-325 MG tablet Take 3 tab daily PRN   HYDROcodone-acetaminophen (NORCO/VICODIN) 5-325 MG tablet Take 1 tablet by mouth every 6 (six) hours as needed for moderate pain.   meloxicam (MOBIC) 15 MG tablet TAKE 1 TABLET BY MOUTH DAILY AS NEEDED.   Multiple Vitamins-Minerals (MULTIVITAMIN ADULT PO) Take 1 tablet by mouth daily.   omega-3 acid ethyl esters (LOVAZA) 1 g capsule TAKE 4 CAPSULES DAILY   oxybutynin (DITROPAN-XL) 5 MG 24 hr tablet TAKE 1 TABLET BY MOUTH EVERYDAY AT BEDTIME   pravastatin (PRAVACHOL) 40 MG tablet TAKE 1 TABLET DAILY   prednisoLONE acetate (PRED FORTE) 1 % ophthalmic suspension Place 1 drop into the left eye daily.    No facility-administered medications prior to visit.         Objective    BP (!) 147/74 (BP Location: Left Arm, Patient Position: Sitting, Cuff Size: Normal)   Pulse  73   Temp 98.1 F (36.7 C) (Oral)   Wt 110 lb (49.9 kg)   SpO2 95%   BMI 22.22 kg/m    Physical Exam   General: Appearance:    Well developed, well nourished female in no acute distress  Eyes:    PERRL, conjunctiva/corneas clear, EOM's intact       Lungs:     Clear to auscultation bilaterally, respirations unlabored  Heart:    Normal heart rate. Normal rhythm. No murmurs, rubs, or gallops.    MS:   All extremities are intact.    Neurologic:   Awake, alert, oriented x 3. No apparent focal neurological defect.         Assessment & Plan     1. Osteoarthritis of  left knee, unspecified osteoarthritis type Fentanyl '50mg'$  has provided significant pain relief, but has had much more pain since running out of hydrocodone/apap 5 and reducing hydrocodone/apap 7.5 to TID  Start back on  HYDROcodone-acetaminophen (NORCO/VICODIN) 5-325 MG tablet; Take 1 tablet by mouth 3 (three) times daily (down from four daily)  Dispense: 360 tablet; Refill: 0 Refill  HYDROcodone-acetaminophen (NORCO) 7.5-325 MG tablet; Take 1 tablet by mouth every 6 (six) hours as needed for moderate pain (back up to  her previous regiment)    Future Appointments  Date Time Provider [place]  10/12/2022 10:00 AM Birdie Sons, MD [facility]  03/02/2023  9:45 AM [facility]-NURSE HEALTH ADVISOR [facility]        The entirety of the information documented in the History of Present Illness, Review of Systems and Physical Exam were personally obtained by me. Portions of this information were initially documented by the CMA and reviewed by me for thoroughness and accuracy.     Lelon Huh, MD  [facility] [phone] (phone) [phone] (fax)  [place]

## 2022-07-11 NOTE — Telephone Encounter (Signed)
Returned call to [place].  She states she has already figured it out. No need for clarification.

## 2022-07-11 NOTE — Telephone Encounter (Signed)
Julia Salazar at [facility] called asking about directions on the new rx for hydrocodone.

## 2022-07-26 ENCOUNTER — Other Ambulatory Visit: Payer: Self-pay | Admitting: Family Medicine

## 2022-07-26 DIAGNOSIS — M47816 Spondylosis without myelopathy or radiculopathy, lumbar region: Secondary | ICD-10-CM

## 2022-07-26 DIAGNOSIS — G8929 Other chronic pain: Secondary | ICD-10-CM

## 2022-07-26 DIAGNOSIS — M545 Low back pain, unspecified: Secondary | ICD-10-CM

## 2022-07-27 ENCOUNTER — Encounter: Payer: Self-pay | Admitting: Family Medicine

## 2022-07-27 MED ORDER — FENTANYL 50 MCG/HR TD PT72: 1.0000 | MEDICATED_PATCH | TRANSDERMAL | 0 refills | Status: DC

## 2022-08-02 ENCOUNTER — Ambulatory Visit: Payer: Self-pay

## 2022-08-02 NOTE — Telephone Encounter (Signed)
    Chief Complaint: Productive cough with clear mucus, SOB with exertion. Currently out of town. Appointment next week per Judson Roch in the practice. Symptoms: Cough Frequency: 2 weeks Pertinent Negatives: Patient denies fever Disposition: '[]'$ ED /'[]'$ Urgent Care (no appt availability in office) / '[x]'$ Appointment(In office/virtual)/ '[]'$  [place]/ '[]'$ Home Care/ '[]'$ Refused Recommended Disposition /'[]'$ [place] [facility]/ '[]'$  Follow-up with PCP Additional Notes: Will go to ED for worsening of symptoms.  Reason for Disposition  Cough has been present for > 3 weeks  Answer Assessment - Initial Assessment Questions 1. ONSET: "When did the cough begin?"      2 weeks 2. SEVERITY: "How bad is the cough today?"      Severe 3. SPUTUM: "Describe the color of your sputum" (none, dry cough; clear, white, yellow, green)     Frothy, clear 4. HEMOPTYSIS: "Are you coughing up any blood?" If so ask: "How much?" (flecks, streaks, tablespoons, etc.)     No 5. DIFFICULTY BREATHING: "Are you having difficulty breathing?" If Yes, ask: "How bad is it?" (e.g., mild, moderate, severe)    - MILD: No SOB at rest, mild SOB with walking, speaks normally in sentences, can lie down, no retractions, pulse < 100.    - MODERATE: SOB at rest, SOB with minimal exertion and prefers to sit, cannot lie down flat, speaks in phrases, mild retractions, audible wheezing, pulse 100-120.    - SEVERE: Very SOB at rest, speaks in single words, struggling to breathe, sitting hunched forward, retractions, pulse > 120      SOB with exertion 6. FEVER: "Do you have a fever?" If Yes, ask: "What is your temperature, how was it measured, and when did it start?"     No 7. CARDIAC HISTORY: "Do you have any history of heart disease?" (e.g., heart attack, congestive heart failure)      No 8. LUNG HISTORY: "Do you have any history of lung disease?"  (e.g., pulmonary embolus, asthma, emphysema)     No 9. PE RISK FACTORS: "Do you have a  history of blood clots?" (or: recent major surgery, recent prolonged travel, bedridden)     No 10. OTHER SYMPTOMS: "Do you have any other symptoms?" (e.g., runny nose, wheezing, chest pain)       No 11. PREGNANCY: "Is there any chance you are pregnant?" "When was your last menstrual period?"       No 12. TRAVEL: "Have you traveled out of the country in the last month?" (e.g., travel history, exposures)       No  Protocols used: Cough - Acute Productive-A-AH

## 2022-08-04 ENCOUNTER — Other Ambulatory Visit: Payer: Self-pay | Admitting: Family Medicine

## 2022-08-09 ENCOUNTER — Encounter: Payer: Self-pay | Admitting: Family Medicine

## 2022-08-09 ENCOUNTER — Other Ambulatory Visit: Payer: Self-pay

## 2022-08-09 ENCOUNTER — Emergency Department: Payer: Medicare Other

## 2022-08-09 ENCOUNTER — Ambulatory Visit
Admission: RE | Admit: 2022-08-09 | Discharge: 2022-08-09 | Disposition: A | Discharge status: Home / Self Care | Payer: Medicare Other | Source: Home / Self Care | Attending: Family Medicine | Admitting: Family Medicine

## 2022-08-09 ENCOUNTER — Inpatient Hospital Stay
Admission: EM | Admit: 2022-08-09 | Discharge: 2022-08-12 | DRG: 280 | Disposition: A | Discharge status: Home Health | Payer: Medicare Other | Attending: Internal Medicine | Admitting: Internal Medicine

## 2022-08-09 ENCOUNTER — Ambulatory Visit
Admission: RE | Admit: 2022-08-09 | Discharge: 2022-08-09 | Disposition: A | Discharge status: Home / Self Care | Payer: Medicare Other | Source: Ambulatory Visit | Attending: Family Medicine | Admitting: Family Medicine

## 2022-08-09 ENCOUNTER — Encounter: Payer: Self-pay | Admitting: Emergency Medicine

## 2022-08-09 ENCOUNTER — Ambulatory Visit (INDEPENDENT_AMBULATORY_CARE_PROVIDER_SITE_OTHER): Payer: Medicare Other | Admitting: Family Medicine

## 2022-08-09 VITALS — BP 164/77 | HR 67 | Temp 98.0°F | Resp 16 | Wt 106.0 lb

## 2022-08-09 DIAGNOSIS — K2100 Gastro-esophageal reflux disease with esophagitis, without bleeding: Secondary | ICD-10-CM | POA: Diagnosis present

## 2022-08-09 DIAGNOSIS — Z90710 Acquired absence of both cervix and uterus: Secondary | ICD-10-CM

## 2022-08-09 DIAGNOSIS — I130 Hypertensive heart and chronic kidney disease with heart failure and stage 1 through stage 4 chronic kidney disease, or unspecified chronic kidney disease: Secondary | ICD-10-CM | POA: Diagnosis present

## 2022-08-09 DIAGNOSIS — I10 Essential (primary) hypertension: Secondary | ICD-10-CM | POA: Diagnosis present

## 2022-08-09 DIAGNOSIS — R051 Acute cough: Secondary | ICD-10-CM

## 2022-08-09 DIAGNOSIS — K449 Diaphragmatic hernia without obstruction or gangrene: Secondary | ICD-10-CM | POA: Diagnosis present

## 2022-08-09 DIAGNOSIS — Z806 Family history of leukemia: Secondary | ICD-10-CM

## 2022-08-09 DIAGNOSIS — Z7982 Long term (current) use of aspirin: Secondary | ICD-10-CM

## 2022-08-09 DIAGNOSIS — R059 Cough, unspecified: Secondary | ICD-10-CM | POA: Diagnosis present

## 2022-08-09 DIAGNOSIS — E785 Hyperlipidemia, unspecified: Secondary | ICD-10-CM | POA: Diagnosis present

## 2022-08-09 DIAGNOSIS — Z9049 Acquired absence of other specified parts of digestive tract: Secondary | ICD-10-CM

## 2022-08-09 DIAGNOSIS — N189 Chronic kidney disease, unspecified: Secondary | ICD-10-CM

## 2022-08-09 DIAGNOSIS — Z800 Family history of malignant neoplasm of digestive organs: Secondary | ICD-10-CM

## 2022-08-09 DIAGNOSIS — I214 Non-ST elevation (NSTEMI) myocardial infarction: Secondary | ICD-10-CM | POA: Diagnosis not present

## 2022-08-09 DIAGNOSIS — Z66 Do not resuscitate: Secondary | ICD-10-CM | POA: Diagnosis present

## 2022-08-09 DIAGNOSIS — I5021 Acute systolic (congestive) heart failure: Secondary | ICD-10-CM | POA: Diagnosis present

## 2022-08-09 DIAGNOSIS — I429 Cardiomyopathy, unspecified: Secondary | ICD-10-CM | POA: Diagnosis present

## 2022-08-09 DIAGNOSIS — Z79899 Other long term (current) drug therapy: Secondary | ICD-10-CM

## 2022-08-09 DIAGNOSIS — E876 Hypokalemia: Secondary | ICD-10-CM | POA: Diagnosis present

## 2022-08-09 DIAGNOSIS — Z807 Family history of other malignant neoplasms of lymphoid, hematopoietic and related tissues: Secondary | ICD-10-CM

## 2022-08-09 DIAGNOSIS — Z803 Family history of malignant neoplasm of breast: Secondary | ICD-10-CM

## 2022-08-09 DIAGNOSIS — N179 Acute kidney failure, unspecified: Secondary | ICD-10-CM | POA: Diagnosis present

## 2022-08-09 DIAGNOSIS — Z8249 Family history of ischemic heart disease and other diseases of the circulatory system: Secondary | ICD-10-CM

## 2022-08-09 DIAGNOSIS — Z8543 Personal history of malignant neoplasm of ovary: Secondary | ICD-10-CM

## 2022-08-09 DIAGNOSIS — N1831 Chronic kidney disease, stage 3a: Secondary | ICD-10-CM | POA: Diagnosis present

## 2022-08-09 LAB — BASIC METABOLIC PANEL
Anion gap: 15 (ref 5–15)
BUN: 62 mg/dL — ABNORMAL HIGH (ref 8–23)
CO2: 21 mmol/L — ABNORMAL LOW (ref 22–32)
Calcium: 10.1 mg/dL (ref 8.9–10.3)
Chloride: 99 mmol/L (ref 98–111)
Creatinine, Ser: 1.73 mg/dL — ABNORMAL HIGH (ref 0.44–1.00)
GFR, Estimated: 28 mL/min — ABNORMAL LOW (ref >60)
Glucose, Bld: 136 mg/dL — ABNORMAL HIGH (ref 70–99)
Potassium: 3.4 mmol/L — ABNORMAL LOW (ref 3.5–5.1)
Sodium: 135 mmol/L (ref 135–145)

## 2022-08-09 LAB — CBC
HCT: 38.7 % (ref 36.0–46.0)
Hemoglobin: 13.3 g/dL (ref 12.0–15.0)
MCH: 31.1 pg (ref 26.0–34.0)
MCHC: 34.4 g/dL (ref 30.0–36.0)
MCV: 90.4 fL (ref 80.0–100.0)
Platelets: 351 10*3/uL (ref 150–400)
RBC: 4.28 MIL/uL (ref 3.87–5.11)
RDW: 11.7 % (ref 11.5–15.5)
WBC: 11.9 10*3/uL — ABNORMAL HIGH (ref 4.0–10.5)
nRBC: 0 % (ref 0.0–0.2)

## 2022-08-09 LAB — APTT: aPTT: 33 seconds (ref 24–36)

## 2022-08-09 LAB — TROPONIN I (HIGH SENSITIVITY): Troponin I (High Sensitivity): 2107 ng/L (ref <18)

## 2022-08-09 LAB — PROTIME-INR
INR: 1.1 (ref 0.8–1.2)
Prothrombin Time: 14.1 seconds (ref 11.4–15.2)

## 2022-08-09 MED ORDER — BENZONATATE 100 MG PO CAPS
100.0000 mg | ORAL_CAPSULE | Freq: Once | ORAL | Status: AC
  Administered 2022-08-09: 100 mg via ORAL
  Filled 2022-08-09: qty 1

## 2022-08-09 MED ORDER — MORPHINE SULFATE (PF) 2 MG/ML IV SOLN
2.0000 mg | Freq: Once | INTRAVENOUS | Status: AC
  Administered 2022-08-09: 2 mg via INTRAVENOUS
  Filled 2022-08-09: qty 1

## 2022-08-09 MED ORDER — LACTATED RINGERS IV BOLUS
1000.0000 mL | Freq: Once | INTRAVENOUS | Status: AC
  Administered 2022-08-10: 1000 mL via INTRAVENOUS

## 2022-08-09 MED ORDER — AMOXICILLIN-POT CLAVULANATE 875-125 MG PO TABS: 1.0000 | ORAL_TABLET | Freq: Two times a day (BID) | ORAL | 0 refills | Status: DC

## 2022-08-09 MED ORDER — ASPIRIN 81 MG PO CHEW
324.0000 mg | CHEWABLE_TABLET | Freq: Once | ORAL | Status: AC
  Administered 2022-08-09: 324 mg via ORAL
  Filled 2022-08-09: qty 4

## 2022-08-09 MED ORDER — HEPARIN BOLUS VIA INFUSION
2900.0000 [IU] | Freq: Once | INTRAVENOUS | Status: AC
  Administered 2022-08-10: 2900 [IU] via INTRAVENOUS
  Filled 2022-08-09: qty 2900

## 2022-08-09 MED ORDER — NITROGLYCERIN 0.4 MG SL SUBL
0.4000 mg | SUBLINGUAL_TABLET | SUBLINGUAL | Status: DC | PRN
  Administered 2022-08-10 (×2): 0.4 mg via SUBLINGUAL
  Filled 2022-08-09: qty 1

## 2022-08-09 MED ORDER — IOHEXOL 350 MG/ML SOLN
60.0000 mL | Freq: Once | INTRAVENOUS | Status: AC | PRN
  Administered 2022-08-09: 60 mL via INTRAVENOUS

## 2022-08-09 MED ORDER — HEPARIN (PORCINE) 25000 UT/250ML-% IV SOLN
600.0000 [IU]/h | INTRAVENOUS | Status: AC
  Administered 2022-08-10 – 2022-08-11 (×2): 600 [IU]/h via INTRAVENOUS
  Filled 2022-08-09 (×2): qty 250

## 2022-08-09 NOTE — ED Notes (Addendum)
Radiology called to stat read outpatient CXR

## 2022-08-09 NOTE — ED Provider Notes (Addendum)
[facility] Provider Note    Event Date/Time   First MD Initiated Contact with Patient 08/09/22 2202     (approximate)   History   Chest Pain   HPI  Julia Salazar is a 88 y.o. female past medical history of arthritis hypertension hyperlipidemia presents with cough.  Patient's family notes that over the last several months she will have coughing fits that last about an hour and then go away.  Since around 10 AM when they were at her primary doctor's office the coughing fit has not gone away.  For about an hour she was cough free but has not really been able to eat or drink eating.  She complains of pain in her chest worse with coughing similar to pain she had in the past with coughing.  Also feels short of breath.  She also endorses some upper abdominal pain.  Denies fevers or chills.  There is no history of cardiac disease.     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   Hypothyroidism 05/14/2018   Chronic diarrhea 10/05/2016   Allergic rhinitis 11/24/2015   Arthritis 11/24/2015   Diverticulosis of colon 11/24/2015   Family history of breast cancer 11/24/2015   Fatigue 11/24/2015   Low back pain 11/24/2015   Arthritis of knee, degenerative 11/24/2015   POAG (primary open-angle glaucoma) 09/18/2013   Edema 09/03/2009   Insomnia 08/13/2009   Vitamin D deficiency 03/29/2009   Arthralgia of hip or thigh 03/27/2009   Hyperlipidemia, mixed 03/17/2009   Osteopenia 06/14/2006   Colon polyp 07/09/2002   Clinical depression 12/05/1998    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 12/05/1998   Essential (primary) hypertension 12/05/1998   Headache, migraine 12/05/1998   History of ovarian cancer 12/05/1984   H/O total hysterectomy 12/06/1971     Physical Exam  Triage Vital Signs: ED Triage Vitals  Enc Vitals Group     BP 08/09/22 2152 (!) 138/106     Pulse Rate 08/09/22 2152 86     Resp 08/09/22 2152 (!) 22     Temp 08/09/22 2152 98.4 F (36.9 C)     Temp Source 08/09/22 2152 Oral     SpO2 08/09/22 2152 97 %     Weight 08/09/22 2148 106 lb (48.1 kg)     Height 08/09/22 2148 '4\' 11"'$  (1.499 m)     Head Circumference --      Peak Flow --      Pain Score --      Pain Loc --      Pain Edu? --      Excl. in [place]? --     Most recent vital signs: Vitals:   08/09/22 2300 08/09/22 2330  BP: (!) 119/105 103/71  Pulse: 78 75  Resp: (!) 30 (!) 22  Temp:    SpO2: 99% 98%     General: Awake, and is coughing constantly and spitting up phlegm, looks unwell  CV:  Good peripheral perfusion.  No peripheral edema Resp:  Normal effort.  Lungs are clear Abd:  No distention.  Abdomen is soft and nontender Neuro:             Awake, Alert, Oriented x 3  Other:     ED Results / Procedures / Treatments  Labs (all labs ordered are listed, but only abnormal results are displayed) Labs Reviewed  BASIC METABOLIC PANEL - Abnormal; Notable for the following components:      Result Value   Potassium 3.4 (*)    CO2 21 (*)    Glucose, Bld 136 (*)    BUN 62 (*)    Creatinine, Ser 1.73 (*)    GFR, Estimated 28 (*)    All other components within normal limits  CBC - Abnormal; Notable for the following components:   WBC 11.9 (*)    All other components within normal limits  TROPONIN I (HIGH SENSITIVITY) - Abnormal; Notable for the following components:   Troponin I (High Sensitivity) 2,107 (*)    All other components within normal limits  PROTIME-INR  APTT  HEPARIN  LEVEL (UNFRACTIONATED)  TROPONIN I (HIGH SENSITIVITY)     EKG  EKG shows a left bundle branch block sinus rhythm, axis deviation, no Sgarbossa criteria   RADIOLOGY   PROCEDURES:  Critical Care performed: Yes, see critical care procedure note(s)  .Critical Care  Performed by: Rada Hay, MD Authorized by: Rada Hay, MD   Critical care provider statement:    Critical care time (minutes):  30   Critical care was time spent personally by me on the following activities:  Development of treatment plan with patient or surrogate, discussions with consultants, evaluation of patient's response to treatment, examination of patient, ordering and review of laboratory studies, ordering and review of radiographic studies, ordering and performing treatments and interventions, pulse oximetry, re-evaluation of patient's condition and review of old charts   The patient is on the cardiac monitor to evaluate for evidence of arrhythmia and/or significant heart rate changes.   MEDICATIONS ORDERED IN ED: Medications  nitroGLYCERIN (NITROSTAT) SL tablet 0.4 mg (has no administration in time range)  heparin bolus via infusion 2,900 Units (has no administration in time range)  heparin ADULT infusion 100 units/mL (25000 units/245m) (has no administration in time range)  lactated ringers bolus 1,000 mL (has no administration in time range)  aspirin chewable tablet 324 mg (324 mg Oral Given 08/09/22 2256)  morphine (PF) 2 MG/ML injection 2 mg (2 mg Intravenous Given 08/09/22 2256)  benzonatate (TESSALON) capsule 100 mg (100 mg Oral Given 08/09/22 2256)     IMPRESSION / MDM / ASSESSMENT AND PLAN / ED COURSE  I reviewed the triage vital signs and the nursing notes.                              Patient's presentation is most consistent with acute presentation with potential threat to life or bodily function.  Differential diagnosis includes, but is not limited to, bronchitis, aspiration,  pneumonia, pulmonary embolism, acute coronary syndrome  The patient is a 88year old female who presents primarily with cough but also with chest pain and shortness of breath.  Her family notes that she has had coughing fits over the last several months usually last about an hour and then resolved.  She has been coughing rather constantly since around 10 AM and is now having chest pain  and shortness of breath.  Patient looks uncomfortable on my evaluation coughing spitting up she is holding her chest.  Her lungs are clear she has no lower extremity swelling.  EKG shows a left bundle branch block without Sgarbossa criteria to suggest ischemia.  Last EKG in our system does not have a left bundle however I reviewed the description of EKG from outside hospital in care everywhere and it is noted that she had a left bundle branch block so this is not new today.  Notably her troponin is over 2000.  Her symptoms are quite atypical but this is certainly concerning that this is more of an a cardiac event.  We will give aspirin and started on heparin given the chest pain.  We will try nitroglycerin and see if this helps with her pain.  She will require admission.  On reassessment patient clutching her chest looks uncomfortable.  I would like to rule her out for dissection.  GFR is 28 will give a liter of fluid.  I discussed with the radiologist Dr. Christa See in the situation I feel like the risk of contrast-induced nephropathy is outweighed by the risk of missing a life-threatening aortic dissection.  Radiologist agrees.  We will go ahead with contrast study.     FINAL CLINICAL IMPRESSION(S) / ED DIAGNOSES   Final diagnoses:  NSTEMI (non-ST elevated myocardial infarction) ([place])     Rx / DC Orders   ED Discharge Orders     None        Note:  This document was prepared using Dragon voice recognition software and may include unintentional dictation errors.   Rada Hay, MD 08/09/22 2249     Rada Hay, MD 08/09/22 [phone]

## 2022-08-09 NOTE — ED Triage Notes (Addendum)
Pt arrived via POV with daughter, reports pt has been having chest pain today, has been having a cough for several weeks and has coughing episodes, but none that have ever lasted all day, pt pt has not even been able to drink liquids, pt just continues to cough   Pt c/o pain from throat down through her chest.  Pt was prescribed Augmentin today by PCP for possible URI.  Pt has been complaining of SOB. Pt c/o feeling like there is something stuck in her throat.

## 2022-08-09 NOTE — ED Notes (Signed)
Per MD, hold Heparin until CTA resulted.

## 2022-08-09 NOTE — ED Notes (Signed)
ED Provider at bedside. 

## 2022-08-09 NOTE — Progress Notes (Signed)
ANTICOAGULATION CONSULT NOTE  Pharmacy Consult for heparin infusion Indication: ACS/STEMI  Allergies  Allergen Reactions   Lovastatin    Sertraline Hcl    Etodolac Rash    Patient Measurements: Height: '4\' 11"'$  (149.9 cm) Weight: 48.1 kg (106 lb) IBW/kg (Calculated) : 43.2 Heparin Dosing Weight: 48.1 kg  Vital Signs: Temp: 98.4 F (36.9 C) (09/05 2152) Temp Source: Oral (09/05 2152) BP: 138/106 (09/05 2152) Pulse Rate: 86 (09/05 2152)  Labs: Recent Labs    08/09/22 2155  HGB 13.3  HCT 38.7  PLT 351  APTT 33  LABPROT 14.1  INR 1.1  CREATININE 1.73*  TROPONINIHS 2,107*    Estimated Creatinine Clearance: 14.4 mL/min (A) (by C-G formula based on SCr of 1.73 mg/dL (H)).   Medical History: Past Medical History:  Diagnosis Date   Depression    Diverticulosis    History of chicken pox    History of Descemet's stripping endothelial keratoplasty (DSEK)    2019 and 2023   History of measles    History of mumps    Hyperlipidemia    Hypertension    Ovarian cancer ([place]) 1995    Medications:  PTA Meds: ***  Assessment: Pt is a *** yo *** presenting to ED ***  Goal of Therapy:  {Goals:3041534} {Monitor platelets by anticoagulation protocol:3041561::"Monitor platelets by anticoagulation protocol: Yes"}   Plan:  Bolus 2900 units x 1 Start heparin infusion at 600 units/hr Will follow aPTT until correlation w/ HL confirmed Will check aPTT in 8 hr after start of infusion HL & CBC daily while on heparin  Renda Rolls, PharmD, [facility] 08/09/2022 11:07 PM

## 2022-08-09 NOTE — Progress Notes (Signed)
I,[name],acting as a scribe for Julia Huh, MD.,have documented all relevant documentation on the behalf of Julia Huh, MD,as directed by  Julia Huh, MD while in the presence of Julia Huh, MD.  Established patient visit   Patient: Julia Salazar   DOB: 1929/02/03   88 y.o. Female  MRN: [id number] Visit Date: 08/09/2022  Today's healthcare provider: Lelon Huh, MD   Chief Complaint  Patient presents with   Cough   Subjective    Patient presents for cough /productive with frothy, clear mucus and SOB with exertion. Onset 3 weeks. Daughter reports fits of coughing and makes patient frustrated and tearful.  Reports it feels hung in throat and chest.  Cough is episodic, feels like its coming from deep in center of chest, but also from through. Not had any sinus or nasal congestion or drainage. Reports no coughing for past 2 days until today which episode lasted over an hour.  Reports SOB even with rest.  Denies chest pain. No difficulty eating or swallowing except while coughing.   Medications: Outpatient Medications Prior to Visit  Medication Sig   amLODipine (NORVASC) 5 MG tablet TAKE 1 TABLET DAILY   aspirin 81 MG tablet Take 1 tablet by mouth daily.   brimonidine-timolol (COMBIGAN) 0.2-0.5 % ophthalmic solution Place 1 drop into both eyes every 12 (twelve) hours.   Calcium Carb-Cholecalciferol 600-200 MG-UNIT TABS Take 1 tablet by mouth daily.   candesartan (ATACAND) 8 MG tablet TAKE 1 TABLET BY MOUTH EVERY DAY   carisoprodol (SOMA) 350 MG tablet Take 1 tablet (350 mg total) by mouth every 6 (six) hours as needed for muscle spasms.   cholestyramine light (PREVALITE) 4 GM/DOSE powder MIX 2 GRAMS WITH FLUIDS AND DRINK EVERY 2-3 DAYS AS NEEDED FOR LOOSE BOWELS (Patient taking differently: MIX 2 GRAMS WITH FLUIDS AND DRINK EVERY 2-3 DAYS AS NEEDED FOR LOOSE BOWELS)   diclofenac Sodium (VOLTAREN) 1 % GEL Apply topically 4 (four) times daily.   diphenhydrAMINE  (BENADRYL) 25 mg capsule Take 25 mg by mouth at bedtime.    diphenoxylate-atropine (LOMOTIL) 2.5-0.025 MG tablet Take 1 tablet by mouth 4 (four) times daily as needed.   fentaNYL (DURAGESIC) 50 MCG/HR Place 1 patch onto the skin every 3 (three) days.   FLUoxetine (PROZAC) 20 MG capsule TAKE 1 CAPSULE BY MOUTH EVERY DAY   furosemide (LASIX) 40 MG tablet TAKE 1 TABLET DAILY AS     NEEDED FOR SWELLING   hydrochlorothiazide (HYDRODIURIL) 12.5 MG tablet TAKE 1 TABLET DAILY   HYDROcodone-acetaminophen (NORCO) 7.5-325 MG tablet Take 1 tablet by mouth every 6 (six) hours as needed for moderate pain.   HYDROcodone-acetaminophen (NORCO/VICODIN) 5-325 MG tablet Take 1 tablet by mouth 3 (three) times daily as needed for moderate pain.   meloxicam (MOBIC) 15 MG tablet TAKE 1 TABLET BY MOUTH DAILY AS NEEDED.   Multiple Vitamins-Minerals (MULTIVITAMIN ADULT PO) Take 1 tablet by mouth daily.   omega-3 acid ethyl esters (LOVAZA) 1 g capsule TAKE 4 CAPSULES DAILY   oxybutynin (DITROPAN-XL) 5 MG 24 hr tablet TAKE 1 TABLET BY MOUTH EVERYDAY AT BEDTIME   pravastatin (PRAVACHOL) 40 MG tablet TAKE 1 TABLET DAILY   prednisoLONE acetate (PRED FORTE) 1 % ophthalmic suspension Place 1 drop into the left eye daily.    No facility-administered medications prior to visit.    Review of Systems  Constitutional:  Negative for appetite change, chills, fatigue and fever.  Respiratory:  Negative for chest tightness and shortness  of breath.   Cardiovascular:  Negative for chest pain and palpitations.  Gastrointestinal:  Negative for abdominal pain, nausea and vomiting.  Neurological:  Negative for dizziness and weakness.       Objective    BP (!) 164/77 (BP Location: Left Arm, Patient Position: Sitting, Cuff Size: Normal)   Pulse 67   Temp 98 F (36.7 C) (Oral)   Resp 16   Wt 106 lb (48.1 kg)   SpO2 97%   BMI 21.41 kg/m    Physical Exam   General: Appearance:    Well developed, well nourished female in no acute  distress, but coughing frequently throughout interview.   OP:   Pink and clear with no post nasal drainage.   Eyes:    PERRL, conjunctiva/corneas clear, EOM's intact       Lungs:     Clear to auscultation bilaterally, respirations unlabored  Heart:    Normal heart rate. Normal rhythm. No murmurs, rubs, or gallops.    MS:   All extremities are intact.    Neurologic:   Awake, alert, oriented x 3. No apparent focal neurological defect.         Assessment & Plan     1. Acute cough No sign  - DG Chest 2 View; Future - amoxicillin-clavulanate (AUGMENTIN) 875-125 MG tablet; Take 1 tablet by mouth 2 (two) times daily for 7 days.  Dispense: 14 tablet; Refill: 0      The entirety of the information documented in the History of Present Illness, Review of Systems and Physical Exam were personally obtained by me. Portions of this information were initially documented by the CMA and reviewed by me for thoroughness and accuracy.     Julia Huh, MD  [facility] [phone] (phone) [phone] (fax)  [place]

## 2022-08-10 ENCOUNTER — Inpatient Hospital Stay (HOSPITAL_COMMUNITY)
Admit: 2022-08-10 | Discharge: 2022-08-10 | Disposition: A | Discharge status: Home / Self Care | Payer: Medicare Other | Attending: Cardiology | Admitting: Cardiology

## 2022-08-10 DIAGNOSIS — I5020 Unspecified systolic (congestive) heart failure: Secondary | ICD-10-CM | POA: Diagnosis not present

## 2022-08-10 DIAGNOSIS — I130 Hypertensive heart and chronic kidney disease with heart failure and stage 1 through stage 4 chronic kidney disease, or unspecified chronic kidney disease: Secondary | ICD-10-CM | POA: Diagnosis present

## 2022-08-10 DIAGNOSIS — N1831 Chronic kidney disease, stage 3a: Secondary | ICD-10-CM | POA: Diagnosis present

## 2022-08-10 DIAGNOSIS — K449 Diaphragmatic hernia without obstruction or gangrene: Secondary | ICD-10-CM | POA: Diagnosis present

## 2022-08-10 DIAGNOSIS — E785 Hyperlipidemia, unspecified: Secondary | ICD-10-CM | POA: Diagnosis present

## 2022-08-10 DIAGNOSIS — Z66 Do not resuscitate: Secondary | ICD-10-CM | POA: Diagnosis present

## 2022-08-10 DIAGNOSIS — E876 Hypokalemia: Secondary | ICD-10-CM | POA: Diagnosis present

## 2022-08-10 DIAGNOSIS — I214 Non-ST elevation (NSTEMI) myocardial infarction: Secondary | ICD-10-CM

## 2022-08-10 DIAGNOSIS — Z9049 Acquired absence of other specified parts of digestive tract: Secondary | ICD-10-CM | POA: Diagnosis not present

## 2022-08-10 DIAGNOSIS — Z807 Family history of other malignant neoplasms of lymphoid, hematopoietic and related tissues: Secondary | ICD-10-CM | POA: Diagnosis not present

## 2022-08-10 DIAGNOSIS — N179 Acute kidney failure, unspecified: Secondary | ICD-10-CM | POA: Diagnosis present

## 2022-08-10 DIAGNOSIS — N189 Chronic kidney disease, unspecified: Secondary | ICD-10-CM

## 2022-08-10 DIAGNOSIS — Z806 Family history of leukemia: Secondary | ICD-10-CM | POA: Diagnosis not present

## 2022-08-10 DIAGNOSIS — K2100 Gastro-esophageal reflux disease with esophagitis, without bleeding: Secondary | ICD-10-CM | POA: Diagnosis present

## 2022-08-10 DIAGNOSIS — Z800 Family history of malignant neoplasm of digestive organs: Secondary | ICD-10-CM | POA: Diagnosis not present

## 2022-08-10 DIAGNOSIS — Z8249 Family history of ischemic heart disease and other diseases of the circulatory system: Secondary | ICD-10-CM | POA: Diagnosis not present

## 2022-08-10 DIAGNOSIS — Z8543 Personal history of malignant neoplasm of ovary: Secondary | ICD-10-CM | POA: Diagnosis not present

## 2022-08-10 DIAGNOSIS — I429 Cardiomyopathy, unspecified: Secondary | ICD-10-CM | POA: Diagnosis present

## 2022-08-10 DIAGNOSIS — I5021 Acute systolic (congestive) heart failure: Secondary | ICD-10-CM | POA: Diagnosis present

## 2022-08-10 DIAGNOSIS — Z803 Family history of malignant neoplasm of breast: Secondary | ICD-10-CM | POA: Diagnosis not present

## 2022-08-10 DIAGNOSIS — Z90710 Acquired absence of both cervix and uterus: Secondary | ICD-10-CM | POA: Diagnosis not present

## 2022-08-10 DIAGNOSIS — R059 Cough, unspecified: Secondary | ICD-10-CM | POA: Diagnosis present

## 2022-08-10 DIAGNOSIS — Z7982 Long term (current) use of aspirin: Secondary | ICD-10-CM | POA: Diagnosis not present

## 2022-08-10 DIAGNOSIS — I10 Essential (primary) hypertension: Secondary | ICD-10-CM | POA: Diagnosis not present

## 2022-08-10 DIAGNOSIS — Z79899 Other long term (current) drug therapy: Secondary | ICD-10-CM | POA: Diagnosis not present

## 2022-08-10 LAB — HEPARIN LEVEL (UNFRACTIONATED)
Heparin Unfractionated: <0.1 IU/mL — ABNORMAL LOW (ref 0.30–0.70)
Heparin Unfractionated: 0.66 IU/mL (ref 0.30–0.70)
Heparin Unfractionated: 0.63 IU/mL (ref 0.30–0.70)

## 2022-08-10 LAB — ECHOCARDIOGRAM COMPLETE
AR max vel: 2.37 cm2
AV Area VTI: 2.18 cm2
AV Area mean vel: 2.03 cm2
AV Mean grad: 4 mmHg
AV Peak grad: 6.7 mmHg
Ao pk vel: 1.29 m/s
Area-P 1/2: 5.5 cm2
Height: 59 in
S' Lateral: 2.29 cm
Weight: 1695.99 oz

## 2022-08-10 LAB — TROPONIN I (HIGH SENSITIVITY): Troponin I (High Sensitivity): 2301 ng/L (ref <18)

## 2022-08-10 MED ORDER — MORPHINE SULFATE (PF) 2 MG/ML IV SOLN
2.0000 mg | Freq: Once | INTRAVENOUS | Status: AC
  Administered 2022-08-10: 2 mg via INTRAVENOUS
  Filled 2022-08-10: qty 1

## 2022-08-10 MED ORDER — ASPIRIN 81 MG PO TBEC
81.0000 mg | DELAYED_RELEASE_TABLET | Freq: Every day | ORAL | Status: DC
  Administered 2022-08-11 – 2022-08-12 (×2): 81 mg via ORAL
  Filled 2022-08-10 (×2): qty 1

## 2022-08-10 MED ORDER — PANTOPRAZOLE SODIUM 40 MG IV SOLR
40.0000 mg | INTRAVENOUS | Status: DC
  Administered 2022-08-10 – 2022-08-11 (×2): 40 mg via INTRAVENOUS
  Filled 2022-08-10 (×2): qty 10

## 2022-08-10 MED ORDER — ATORVASTATIN CALCIUM 20 MG PO TABS
40.0000 mg | ORAL_TABLET | Freq: Every day | ORAL | Status: DC
  Administered 2022-08-10 – 2022-08-12 (×3): 40 mg via ORAL
  Filled 2022-08-10 (×3): qty 2

## 2022-08-10 MED ORDER — ASPIRIN 300 MG RE SUPP: 300.0000 mg | RECTAL | Status: AC

## 2022-08-10 MED ORDER — METOPROLOL SUCCINATE ER 25 MG PO TB24
12.5000 mg | ORAL_TABLET | Freq: Every day | ORAL | Status: DC
Start: 2022-08-10 — End: 2022-08-12
  Administered 2022-08-11 – 2022-08-12 (×2): 12.5 mg via ORAL
  Filled 2022-08-10: qty 0.5
  Filled 2022-08-10 (×2): qty 1

## 2022-08-10 MED ORDER — MORPHINE SULFATE (PF) 2 MG/ML IV SOLN
2.0000 mg | INTRAVENOUS | Status: DC | PRN
  Administered 2022-08-10 – 2022-08-12 (×4): 2 mg via INTRAVENOUS
  Filled 2022-08-10 (×4): qty 1

## 2022-08-10 MED ORDER — ASPIRIN 81 MG PO CHEW: 324.0000 mg | CHEWABLE_TABLET | ORAL | Status: AC

## 2022-08-10 MED ORDER — METOCLOPRAMIDE HCL 5 MG/ML IJ SOLN
10.0000 mg | Freq: Once | INTRAMUSCULAR | Status: AC
  Administered 2022-08-10: 10 mg via INTRAVENOUS
  Filled 2022-08-10: qty 2

## 2022-08-10 MED ORDER — ONDANSETRON HCL 4 MG/2ML IJ SOLN
4.0000 mg | Freq: Four times a day (QID) | INTRAMUSCULAR | Status: DC | PRN
  Administered 2022-08-11: 4 mg via INTRAVENOUS
  Filled 2022-08-10: qty 2

## 2022-08-10 MED ORDER — FUROSEMIDE 20 MG PO TABS
20.0000 mg | ORAL_TABLET | Freq: Every day | ORAL | Status: DC
  Administered 2022-08-10 – 2022-08-12 (×3): 20 mg via ORAL
  Filled 2022-08-10 (×3): qty 1

## 2022-08-10 MED ORDER — LACTATED RINGERS IV BOLUS
1000.0000 mL | Freq: Once | INTRAVENOUS | Status: AC
  Administered 2022-08-10: 1000 mL via INTRAVENOUS

## 2022-08-10 MED ORDER — LORAZEPAM 2 MG/ML IJ SOLN: 0.2500 mg | Freq: Once | INTRAMUSCULAR | Status: DC | PRN

## 2022-08-10 MED ORDER — LACTATED RINGERS IV SOLN: INTRAVENOUS | Status: DC

## 2022-08-10 MED ORDER — ACETAMINOPHEN 325 MG PO TABS
650.0000 mg | ORAL_TABLET | ORAL | Status: DC | PRN
  Administered 2022-08-11: 650 mg via ORAL
  Filled 2022-08-10: qty 2

## 2022-08-10 MED ORDER — NITROGLYCERIN 0.4 MG SL SUBL: 0.4000 mg | SUBLINGUAL_TABLET | SUBLINGUAL | Status: DC | PRN

## 2022-08-10 NOTE — Consult Note (Signed)
Cardiology Consultation   Patient ID: AMALEE OLSEN MRN: [id number]; DOB: 08-01-1929  Admit date: 08/09/2022 Date of Consult: 08/10/2022  PCP:  Birdie Sons, MD   [place] Providers Cardiologist: New- Consult done by Dr. Carlene Coria here to update MD or APP on Care Team, Refresh:1}     Patient Profile:   Julia Salazar is a 88 y.o. female with a hx of hypertension, arthritis, depression, hyperlipidemia, diverticulosis, and history of ovarian cancer who is being seen 08/10/2022 for the evaluation of cheat pain at the request of Dr. Damita Dunnings.  History of Present Illness:   Julia Salazar is a 88 year old female with a significant medical history of hypertension, arthritis, depression, hyperlipidemia, diverticulosis, and a history of ovarian cancer (1995).   She presented to the [facility] emergency department 08/09/22 with complaints of incessant coughing followed by chest pain and associated shortness of breath. According to family, who remains at the bedside, stated that the patient has had previous coughing spells over the last several months without chest pain. On occasion she has had several bouts of abdominal discomfort. She denies palpitations, weight gain, abdominal swelling or peripheral edema, sick contacts, or previous episodes of chest pain. She also denies any previous cardiac history. Her chest pain started yesterday around 10 am and started during a coughing episode. She had been unable to eat or drink because of the coughing fits she was suffering. She noted that the pain was worse when she was coughing. When asked to describe the pain she could say that it was a constant pain but was unable to describe the pain in relation to sharp, stabbing, heaviness, or tightness.   Initial vitals: blood pressure 138/106, pulse 86, respirations 22, temp 98.4  Pertinent labs: potassium 3.4, CO2 21, glucose 136, bun 62, serum creatinine 1.73, GFR 28, wbc 11.9, Hs troponins 2107 and  2301  Imaging: CTA chest/abd/pelvis negative for PE or dissection  Medication given in the ED:  nitrostat 0.4 mg, heparin bolus and heparin drip was started, LR 1L, aspirin 324 mg, morphin 2 mg IVP, benzonatate 100 mg  Past Medical History:  Diagnosis Date   Depression    Diverticulosis    History of chicken pox    History of Descemet's stripping endothelial keratoplasty (DSEK)    2019 and 2023   History of measles    History of mumps    Hyperlipidemia    Hypertension    Ovarian cancer ([place]) 1995    Past Surgical History:  Procedure Laterality Date   [place]  05/2012   done at [place] in [place]  2010   right eye   [place] Left 08/30/2018   Alvie Heidelberg, MD [facility]   OOPHORECTOMY       Home Medications:  Prior to Admission medications   Medication Sig Start Date End Date Taking? Authorizing Provider  amLODipine (NORVASC) 5 MG tablet TAKE 1 TABLET DAILY 09/08/21  Yes Birdie Sons, MD  amoxicillin-clavulanate (AUGMENTIN) 875-125 MG tablet Take 1 tablet by mouth 2 (two) times daily for 7 days. 08/09/22 08/16/22 Yes Fisher, Kirstie Peri, MD  brimonidine-timolol (COMBIGAN) 0.2-0.5 % ophthalmic solution Place 1 drop into both eyes every 12 (twelve)  hours.   Yes [provider]  Calcium Carb-Cholecalciferol 600-200 MG-UNIT TABS Take 1 tablet by mouth daily. 03/17/09  Yes [provider]  candesartan (ATACAND) 8 MG tablet TAKE 1 TABLET BY MOUTH EVERY DAY 05/11/22  Yes Birdie Sons, MD  carisoprodol (SOMA) 350 MG tablet Take 1 tablet (350 mg total) by mouth every 6 (six) hours as needed for muscle spasms. 01/03/22  Yes Birdie Sons, MD  diphenoxylate-atropine (LOMOTIL) 2.5-0.025 MG tablet Take 1 tablet by mouth 4 (four)  times daily as needed. 07/23/19  Yes Birdie Sons, MD  FLUoxetine (PROZAC) 20 MG capsule TAKE 1 CAPSULE BY MOUTH EVERY DAY 08/04/22  Yes Birdie Sons, MD  furosemide (LASIX) 40 MG tablet TAKE 1 TABLET DAILY AS     NEEDED FOR SWELLING 01/03/22  Yes Birdie Sons, MD  hydrochlorothiazide (HYDRODIURIL) 12.5 MG tablet TAKE 1 TABLET DAILY 03/07/22  Yes Birdie Sons, MD  HYDROcodone-acetaminophen (NORCO/VICODIN) 5-325 MG tablet Take 1 tablet by mouth 3 (three) times daily as needed for moderate pain. 07/11/22 04/07/23 Yes Birdie Sons, MD  meloxicam (MOBIC) 15 MG tablet TAKE 1 TABLET BY MOUTH DAILY AS NEEDED. 05/11/22  Yes Birdie Sons, MD  Multiple Vitamins-Minerals (MULTIVITAMIN ADULT PO) Take 1 tablet by mouth daily. 03/17/09  Yes [provider]  omega-3 acid ethyl esters (LOVAZA) 1 g capsule TAKE 4 CAPSULES DAILY 01/03/22  Yes Birdie Sons, MD  pravastatin (PRAVACHOL) 40 MG tablet TAKE 1 TABLET DAILY 10/18/21  Yes Birdie Sons, MD  prednisoLONE acetate (PRED FORTE) 1 % ophthalmic suspension Place 1 drop into the left eye daily.  12/10/18  Yes [provider]  aspirin 81 MG tablet Take 81 mg by mouth daily. 03/17/09   [provider]  cholestyramine light (PREVALITE) 4 GM/DOSE powder MIX 2 GRAMS WITH FLUIDS AND DRINK EVERY 2-3 DAYS AS NEEDED FOR LOOSE BOWELS Patient not taking: Reported on 08/10/2022 02/04/20   Birdie Sons, MD  diclofenac Sodium (VOLTAREN) 1 % GEL Apply topically 4 (four) times daily.    [provider]  diphenhydrAMINE (BENADRYL) 25 mg capsule Take 25 mg by mouth at bedtime.     [provider]  fentaNYL (DURAGESIC) 50 MCG/HR Place 1 patch onto the skin every 3 (three) days. 07/27/22   Birdie Sons, MD  HYDROcodone-acetaminophen (NORCO) 7.5-325 MG tablet Take 1 tablet by mouth every 6 (six) hours as needed for moderate pain. Patient not taking: Reported on 08/10/2022 07/11/22   Birdie Sons, MD  oxybutynin (DITROPAN-XL)  5 MG 24 hr tablet TAKE 1 TABLET BY MOUTH EVERYDAY AT BEDTIME 08/31/21   Birdie Sons, MD    Inpatient Medications: Scheduled Meds:  aspirin  324 mg Oral NOW   Or   aspirin  300 mg Rectal NOW   [START ON 08/11/2022] aspirin EC  81 mg Oral Daily   atorvastatin  40 mg Oral Daily   pantoprazole (PROTONIX) IV  40 mg Intravenous Q24H   Continuous Infusions:  heparin 600 Units/hr (08/10/22 0730)   PRN Meds: acetaminophen, LORazepam, morphine injection, nitroGLYCERIN, ondansetron (ZOFRAN) IV  Allergies:    Allergies  Allergen Reactions   Lovastatin    Sertraline Hcl    Etodolac Rash    Social History:   Social History   Socioeconomic History   Marital status: Widowed    Spouse name: Not on file   Number of children: 3   Years of education: LPN   Highest education  level: Associate degree: occupational, Hotel manager, or vocational program  Occupational History   Occupation: retired  Tobacco Use   Smoking status: Never   Smokeless tobacco: Never  Vaping Use   Vaping Use: Never used  Substance and Sexual Activity   Alcohol use: No    Alcohol/week: 0.0 standard drinks of alcohol   Drug use: No   Sexual activity: Not on file  Other Topics Concern   Not on file  Social History Narrative   Not on file   Social Determinants of Health   Financial Resource Strain: Low Risk  (02/28/2022)   Overall Financial Resource Strain (CARDIA)    Difficulty of Paying Living Expenses: Not hard at all  Food Insecurity: No Food Insecurity (02/28/2022)   Hunger Vital Sign    Worried About Running Out of Food in the Last Year: Never true    [place] in the Last Year: Never true  Transportation Needs: No Transportation Needs (02/28/2022)   PRAPARE - Hydrologist (Medical): No    Lack of Transportation (Non-Medical): No  Physical Activity: Insufficiently Active (02/28/2022)   Exercise Vital Sign    Days of Exercise per Week: 3 days    Minutes of Exercise per  Session: 30 min  Stress: No Stress Concern Present (02/28/2022)   [place]    Feeling of Stress : Not at all  Social Connections: Socially Isolated (02/28/2022)   Social Connection and Isolation Panel [NHANES]    Frequency of Communication with Friends and Family: More than three times a week    Frequency of Social Gatherings with Friends and Family: More than three times a week    Attends Religious Services: Never    Marine scientist or Organizations: No    Attends Archivist Meetings: Never    Marital Status: Widowed  Intimate Partner Violence: Not At Risk (02/28/2022)   Humiliation, Afraid, Rape, and Kick questionnaire    Fear of Current or Ex-Partner: No    Emotionally Abused: No    Physically Abused: No    Sexually Abused: No    Family History:    Family History  Problem Relation Age of Onset   Colon cancer Mother    Heart attack Father    Breast cancer Sister 49   Leukemia Sister    Hodgkin's lymphoma Sister      ROS:  Please see the history of present illness.  Review of Systems  Constitutional:  Positive for malaise/fatigue.  HENT:  Positive for sore throat.   Respiratory:  Positive for cough and shortness of breath.   Cardiovascular:  Positive for chest pain.  Gastrointestinal:  Positive for abdominal pain.  Musculoskeletal:  Positive for back pain and neck pain.  Neurological:  Positive for weakness.    All other ROS reviewed and negative.     Physical Exam/Data:   Vitals:   08/10/22 0530 08/10/22 0600 08/10/22 0630 08/10/22 0700  BP: 117/76 105/72 122/84 118/72  Pulse: 91 91 96 96  Resp: (!) 24 (!) 21 (!) 22 18  Temp:    98.5 F (36.9 C)  TempSrc:    Oral  SpO2: 96% 95% 98% 97%  Weight:      Height:        Intake/Output Summary (Last 24 hours) at 08/10/2022 0935 Last data filed at 08/10/2022 0226 Gross per 24 hour  Intake 2000 ml  Output --  Net 2000  ml       08/09/2022    9:48 PM 08/09/2022    1:20 PM 07/11/2022    9:55 AM  Last 3 Weights  Weight (lbs) 106 lb 106 lb 110 lb  Weight (kg) 48.081 kg 48.081 kg 49.896 kg     Body mass index is 21.41 kg/m.  General:  Well nourished, well developed, in no acute distress, lying on the emergency room stretcher with eyes closed HEENT: normal Neck: no JVD Vascular: No carotid bruits; Distal pulses 2+ bilaterally Cardiac:  normal S1, S2; RRR; no murmur  Lungs:  clear to auscultation bilaterally, no wheezing, rhonchi or rales, respirations are unlabored at rest on 2L of O2 via [place] Abd: soft, tenderness noted on mild palpitation, no hepatomegaly  Ext: no edema Musculoskeletal:  No deformities, BUE and BLE strength normal and equal Skin: warm and dry  Neuro:  CNs 2-12 intact, no focal abnormalities noted Psych:  Normal affect   EKG:  The EKG was personally reviewed and demonstrates:  sinus rate of 89, pac, lbbb, lafb Telemetry:  Telemetry was personally reviewed and demonstrates:  sinus rate 80-90, lbbb  Relevant CV Studies: Echocardiogram ordered and pending  Laboratory Data:  High Sensitivity Troponin:   Recent Labs  Lab 08/09/22 2155 08/09/22 2329  TROPONINIHS 2,107* 2,301*     Chemistry Recent Labs  Lab 08/09/22 2155  NA 135  K 3.4*  CL 99  CO2 21*  GLUCOSE 136*  BUN 62*  CREATININE 1.73*  CALCIUM 10.1  GFRNONAA 28*  ANIONGAP 15    No results for input(s): "PROT", "ALBUMIN", "AST", "ALT", "ALKPHOS", "BILITOT" in the last 168 hours. Lipids No results for input(s): "CHOL", "TRIG", "HDL", "LABVLDL", "LDLCALC", "CHOLHDL" in the last 168 hours.  Hematology Recent Labs  Lab 08/09/22 2155  WBC 11.9*  RBC 4.28  HGB 13.3  HCT 38.7  MCV 90.4  MCH 31.1  MCHC 34.4  RDW 11.7  PLT 351   Thyroid No results for input(s): "TSH", "FREET4" in the last 168 hours.  BNPNo results for input(s): "BNP", "PROBNP" in the last 168 hours.  DDimer No results for input(s): "DDIMER" in the last 168  hours.   Radiology/Studies:  CT Angio Chest/Abd/Pel for Dissection W and/or Wo Contrast  Result Date: 08/10/2022 CLINICAL DATA:  Chest pain or back pain, aortic dissection suspected. Pt has been having chest pain today, has been having a cough for several weeks and has coughing episodes, but none that have ever lasted all day, pt pt has not even been able to drink liquids, pt just continues to cough EXAM: CT ANGIOGRAPHY CHEST, ABDOMEN AND PELVIS TECHNIQUE: Non-contrast CT of the chest was initially obtained. Multidetector CT imaging through the chest, abdomen and pelvis was performed using the standard protocol during bolus administration of intravenous contrast. Multiplanar reconstructed images and MIPs were obtained and reviewed to evaluate the vascular anatomy. RADIATION DOSE REDUCTION: This exam was performed according to the departmental dose-optimization program which includes automated exposure control, adjustment of the mA and/or kV according to patient size and/or use of iterative reconstruction technique. CONTRAST:  26m OMNIPAQUE IOHEXOL 350 MG/ML SOLN COMPARISON:  CT abdomen pelvis 01/07/2005 FINDINGS: CTA CHEST FINDINGS Cardiovascular: Preferential opacification of the thoracic aorta. No evidence of thoracic aortic aneurysm or dissection. Normal heart size. No significant pericardial effusion. Severe atherosclerotic plaque of the thoracic aorta. At least 3 vessel coronary artery calcifications. The main pulmonary artery is normal in caliber. No central pulmonary embolus. Unable to evaluate more distally due  to timing of contrast. Mediastinum/Nodes: No enlarged mediastinal, hilar, or axillary lymph nodes. Thyroid gland, trachea, and esophagus demonstrate no significant findings. Interval increase in size of a small volume hiatal hernia. Lungs/Pleura: No focal consolidation. Left apical pulmonary micronodule. No follow-up needed if patient is low-risk.This recommendation follows the consensus  statement: Guidelines for Management of Incidental Pulmonary Nodules Detected on CT Images: From the Fleischner Society 2017; Radiology 2017; 284:228-243. No pulmonary mass. No pleural effusion. No pneumothorax. Musculoskeletal: No chest wall abnormality. No suspicious lytic or blastic osseous lesions. No acute displaced fracture. Multilevel degenerative changes of the spine. Review of the MIP images confirms the above findings. CTA ABDOMEN AND PELVIS FINDINGS VASCULAR Aorta: Atherosclerotic plaque. Normal caliber aorta without aneurysm, dissection, vasculitis or significant stenosis. Celiac: Atherosclerotic plaque of its origin. Patent without evidence of aneurysm, dissection, vasculitis or significant stenosis. SMA: Atherosclerotic plaque of its origin. Replaced common hepatic artery off of the superior mesenteric artery. Patent without evidence of aneurysm, dissection, vasculitis or significant stenosis. Renals: Atherosclerotic plaque of the origin. Both renal arteries are patent without evidence of aneurysm, dissection, vasculitis, fibromuscular dysplasia or significant stenosis. IMA: Patent without evidence of aneurysm, dissection, vasculitis or significant stenosis. Inflow: Mild atherosclerotic plaque. Patent without evidence of aneurysm, dissection, vasculitis or significant stenosis. Veins: No obvious venous abnormality within the limitations of this arterial phase study. Review of the MIP images confirms the above findings. NON-VASCULAR Hepatobiliary: The liver is borderline enlarged up to 18 cm. No focal liver abnormality. Status post cholecystectomy. No biliary dilatation. Pancreas: No focal lesion. Normal pancreatic contour. No surrounding inflammatory changes. No main pancreatic ductal dilatation. Spleen: Normal in size without focal abnormality. Adrenals/Urinary Tract: Hyperplastic left adrenal gland.  No adrenal nodule bilaterally. Bilateral kidneys enhance symmetrically. No hydronephrosis. No  hydroureter. The urinary bladder is unremarkable. Stomach/Bowel: Stomach is within normal limits. No evidence of small bowel wall thickening or dilatation. Mobile cecum residing within the rectouterine pouch noted to be dilated up to 8 cm with fluid. No associated fat stranding or bowel wall thickening. No pneumatosis. Appendix appears normal. Lymphatic: No lymphadenopathy. Reproductive: Status post hysterectomy. No adnexal masses. Other: Similar multiple surgical clips noted within the abdomen. No intraperitoneal free fluid. No intraperitoneal free gas. No organized fluid collection. Musculoskeletal: No abdominal wall hernia or abnormality. No suspicious lytic or blastic osseous lesions. No acute displaced fracture. Severe degenerative changes right hip. Grade 2 anterolisthesis of L5 on S1 in the setting of bilateral L5 pars interarticularis defects status post L5-S1 posterolateral and interbody fusion. Review of the MIP images confirms the above findings. IMPRESSION: 1. At least small volume hiatal hernia. Given history, correlate with signs and symptoms of gastric reflux. 2. No acute intrapulmonary abnormality. 3. Mobile cecum residing within the rectouterine pouch dilated up to 8 cm with fluid. No associated bowel thickening, colonic fat stranding, perforation, or obstruction. Indeterminate etiology. Query incidental finding. Electronically Signed   By: Iven Finn M.D.   On: 08/10/2022 00:53   DG Chest Portable 1 View  Result Date: 08/09/2022 CLINICAL DATA:  Increasing shortness of breath. EXAM: PORTABLE CHEST 1 VIEW COMPARISON:  PA and lateral earlier today at 1:59 p.m. FINDINGS: 11:30 p.m. The cardiomediastinal silhouette is within normal limits. There is calcification in the aortic arch. No vascular congestion is seen. The lungs are clear. The sulci are sharp. Osteopenia and degenerative change thoracic spine with lumbar dextroscoliosis partially visible. IMPRESSION: No evidence of acute chest process  or interval changes. Electronically Signed   By: Lanny Hurst  Chesser M.D.   On: 08/09/2022 23:54   DG Chest 2 View  Result Date: 08/09/2022 CLINICAL DATA:  Cough for 3 weeks EXAM: CHEST - 2 VIEW COMPARISON:  None Available. FINDINGS: The heart size and mediastinal contours are within normal limits. Aortic atherosclerosis. Both lungs are clear. Postsurgical changes of the right humeral head. Scoliosis of the spine. IMPRESSION: No active cardiopulmonary disease. Electronically Signed   By: Donavan Foil M.D.   On: 08/09/2022 21:50     Assessment and Plan:   NSTEMI -currently pain free -trend Hs Troponins 2107 and 2301 -continue on Heparin drip for 48 hours -continue aspirin, statin, and prn nitro -echocardiogram ordered and pending -no current invasive procedures planned at this time -continue morphine prn pain -EKG as needed for pain or changes  Hypertension -blood pressure 118/72 -PTA medications currently on hold due to soft blood pressures -Patient previously was on amlodipine 5 mg daily, HCTZ 12.5 mg daily , and furosemide '40mg'$  PRN -vitals per unit protocol  Cough -currently is resolved -CTA revealed hiatal hernia -continue ppi therapy -management per IM  Hyperlipidemia -LDL 111 on 10/08/20 -repeat panel needed -continue statin therapy   Risk Assessment/Risk Scores:    For questions or updates, please contact [place] Please consult [email or web address] for contact info under    Signed, Nelissa Bolduc, NP  08/10/2022 9:35 AM

## 2022-08-10 NOTE — ED Notes (Signed)
2LNC applied 

## 2022-08-10 NOTE — Progress Notes (Signed)
ANTICOAGULATION CONSULT NOTE  Pharmacy Consult for heparin infusion Indication: ACS/STEMI  Allergies  Allergen Reactions   Lovastatin    Sertraline Hcl    Etodolac Rash    Patient Measurements: Height: '4\' 11"'$  (149.9 cm) Weight: 48.1 kg (106 lb) IBW/kg (Calculated) : 43.2 Heparin Dosing Weight: 48.1 kg  Vital Signs: Temp: 98.5 F (36.9 C) (09/06 0700) Temp Source: Oral (09/06 0700) BP: 118/72 (09/06 0700) Pulse Rate: 96 (09/06 0700)  Labs: Recent Labs    08/09/22 2155 08/09/22 2329  HGB 13.3  --   HCT 38.7  --   PLT 351  --   APTT 33  --   LABPROT 14.1  --   INR 1.1  --   HEPARINUNFRC  --  <0.10*  CREATININE 1.73*  --   TROPONINIHS 2,107* 2,301*     Estimated Creatinine Clearance: 14.4 mL/min (A) (by C-G formula based on SCr of 1.73 mg/dL (H)).   Medical History: Past Medical History:  Diagnosis Date   Depression    Diverticulosis    History of chicken pox    History of Descemet's stripping endothelial keratoplasty (DSEK)    2019 and 2023   History of measles    History of mumps    Hyperlipidemia    Hypertension    Ovarian cancer ([place]) 1995   Medications:  Scheduled:   aspirin  324 mg Oral NOW   Or   aspirin  300 mg Rectal NOW   [START ON 08/11/2022] aspirin EC  81 mg Oral Daily   atorvastatin  40 mg Oral Daily   pantoprazole (PROTONIX) IV  40 mg Intravenous Q24H   Infusions:   heparin 600 Units/hr (08/10/22 0730)   PRN: acetaminophen, LORazepam, morphine injection, nitroGLYCERIN, ondansetron (ZOFRAN) IV  Assessment: Julia Salazar is a 88 yo female presenting to ED with worsening cough, subsequent chest pain, & SOB. Not on [facility] PTA per chart review. PMH significant for HTN, arthritis. hsTrop 2107>>2301, EKG revealed NSTEMI. Cardiology consult pending. Pharmacy has been consulted to initiate and manage heparin infusion  Baseline Labs: aPTT 33, PT 14.1, INR 1.1, Hgb 13.3, Hct 38.7, Plt 351   Goal of Therapy:  Heparin level 0.3-0.7  units/ml Monitor platelets by anticoagulation protocol: Yes   Date Time HL Rate/Comment  9/6 0859 0.66 Therapeutic x1  Plan:  Continue heparin infusion at 600 units/hr Check HL in 8 hours Monitor HL & CBC daily while on heparin  Thank you for allowing pharmacy to be a part of this patient's care.  Julia Salazar, PharmD PGY1 Pharmacy Resident 08/10/2022 9:42 AM

## 2022-08-10 NOTE — Progress Notes (Signed)
Progress Note   Patient: Julia Salazar TMH:[id number] DOB: [birth year] DOA: 08/09/2022     0 DOS: the patient was seen and examined on 08/10/2022   Brief hospital course: Taken from H&P.  MYLO DRISKILL is a 88 y.o. female with medical history significant for HTN, arthritis who presents to the ED with an incessant coughing subsequently followed by chest pain, worse with coughing and associated with shortness of breath.  Patient has had previous coughing spells over the past several months but not as protracted as the current one.  She has associated upper abdominal pain.  Denies fever or chills ED course and data review: BP on arrival 138/106 down to 105/67 at the time of admission.  Respiratory rate 22-30 with O2 sat in the high 90s.  Labs with troponin 2107>>2301.  WBC 11,900, creatinine 1.73 up from baseline of 1.23.  EKG, personally viewed and interpreted showing NSR at 85 with nonspecific ST-T wave changes CTA aorta shows the following: IMPRESSION: 1. At least small volume hiatal hernia. Given history, correlate with signs and symptoms of gastric reflux. 2. No acute intrapulmonary abnormality. 3. Mobile cecum residing within the rectouterine pouch dilated up to 8 cm with fluid. No associated bowel thickening, colonic fat stranding, perforation, or obstruction. Indeterminate etiology. Query incidental finding.   Patient started on a heparin infusion treated with morphine and nitroglycerin for pain.  Cardiology was consulted for concern of NSTEMI.  9/6: Cardiology ordered echocardiogram which is pending. They are recommending conservative management with continuation of heparin infusion for 48 hours along with statin, aspirin and as needed nitro. No plan for any intervention at this time. Patient continued to have some substernal chest pain going towards the neck with some associated shortness of breath.   Assessment and Plan: * NSTEMI (non-ST elevated myocardial infarction)  [facility]) Cardiology is recommending conservative management, also ordered echocardiogram. -Continue heparin infusion-for 48 hours -Continue aspirin, atorvastatin nitroglycerin sublingual as needed chest pain Holding beta-blocker due to soft BP on arrival -No plan for any intervention at this time. -Trend troponin -Follow-up echocardiogram results  Cough CTA aorta study showed no infiltrate but did show hiatal hernia Cough could be related to reflux, possible aspiration -PPI trial  Acute kidney injury superimposed on chronic kidney disease ([place]) Most likely prerenal.  Creatinine at 1.73 with baseline around 1.2-1.3 which makes it CKD stage III A. Patient did receive contrast for CTA. -Continue with IV hydration -Monitor renal function -Avoid nephrotoxins  Essential (primary) hypertension Blood pressure currently within goal. -Holding home antihypertensives which include amlodipine, Lasix as needed, HCTZ-can be restarted if needed  History of ovarian cancer No acute issues suspected. -Outpatient follow-up   Subjective: Patient was seen and examined today.  Continued to complain about substernal pain, 5/10 in intensity, feels more like an ache, radiating towards neck and associated with some shortness of breath.  No nausea or vomiting.  Some increasing pain with deep breathing.  Physical Exam: Vitals:   08/10/22 0600 08/10/22 0630 08/10/22 0700 08/10/22 1300  BP: 105/72 122/84 118/72 114/79  Pulse: 91 96 96 76  Resp: (!) 21 (!) '22 18 11  '$ Temp:   98.5 F (36.9 C) 97.6 F (36.4 C)  TempSrc:   Oral Oral  SpO2: 95% 98% 97% 99%  Weight:      Height:       General.  Frail elderly lady, in no acute distress. Pulmonary.  Lungs clear bilaterally, normal respiratory effort. CV.  Regular rate and rhythm, no JVD, rub  or murmur. Abdomen.  Soft, nontender, nondistended, BS positive. CNS.  Alert and oriented .  No focal neurologic deficit. Extremities.  No edema, no cyanosis, pulses  intact and symmetrical. Psychiatry.  Judgment and insight appears normal.  Data Reviewed: Prior notes, labs and images reviewed.  Family Communication: Discussed with daughter at bedside  Disposition: Status is: Inpatient Remains inpatient appropriate because: Severity of illness   Planned Discharge Destination: Home  DVT prophylaxis.  Heparin infusion Time spent: 50 minutes  This record has been created using Systems analyst. Errors have been sought and corrected,but may not always be located. Such creation errors do not reflect on the standard of care.  Author: Lorella Nimrod, MD 08/10/2022 2:37 PM  For on call review [email or web address].

## 2022-08-10 NOTE — Progress Notes (Signed)
*  PRELIMINARY RESULTS* Echocardiogram 2D Echocardiogram has been performed.  Julia Salazar 08/10/2022, 11:05 AM

## 2022-08-10 NOTE — H&P (Signed)
History and Physical    Patient: Julia Salazar ZCH:[id number] DOB: 06-Nov-1929 DOA: 08/09/2022 DOS: the patient was seen and examined on 08/10/2022 PCP: Birdie Sons, MD  Patient coming from: Home  Chief Complaint:  Chief Complaint  Patient presents with   Chest Pain    HPI: Julia Salazar is a 88 y.o. female with medical history significant for HTN, arthritis who presents to the ED with an incessant coughing subsequently followed by chest pain, worse with coughing and associated with shortness of breath.  Patient has had previous coughing spells over the past several months but not as protracted as the current one.  She has associated upper abdominal pain.  Denies fever or chills ED course and data review: BP on arrival 138/106 down to 105/67 at the time of admission.  Respiratory rate 22-30 with O2 sat in the high 90s.  Labs with troponin 2107-2301.  WBC 11,900, creatinine 1.73 up from baseline of 1.23.  EKG, personally viewed and interpreted showing NSR at 85 with nonspecific ST-T wave changes CTA aorta shows the following: IMPRESSION: 1. At least small volume hiatal hernia. Given history, correlate with signs and symptoms of gastric reflux. 2. No acute intrapulmonary abnormality. 3. Mobile cecum residing within the rectouterine pouch dilated up to 8 cm with fluid. No associated bowel thickening, colonic fat stranding, perforation, or obstruction. Indeterminate etiology. Query incidental finding.  Patient started on a heparin infusion treated with morphine and nitroglycerin for pain.  Hospitalist consulted for admission.    Past Medical History:  Diagnosis Date   Depression    Diverticulosis    History of chicken pox    History of Descemet's stripping endothelial keratoplasty (DSEK)    2019 and 2023   History of measles    History of mumps    Hyperlipidemia    Hypertension    Ovarian cancer ([place]) 1995   Past Surgical History:  Procedure Laterality Date   [place] SURGERY  05/2012   done at [facility] in [place]  2010   right eye   [place] Left 08/30/2018   Alvie Heidelberg, MD [facility]   OOPHORECTOMY     Social History:  reports that she has never smoked. She has never used smokeless tobacco. She reports that she does not drink alcohol and does not use drugs.  Allergies  Allergen Reactions   Lovastatin    Sertraline Hcl    Etodolac Rash    Family History  Problem Relation Age of Onset   Colon cancer Mother    Heart attack Father    Breast cancer Sister 40   Leukemia Sister    Hodgkin's lymphoma Sister     Prior to Admission medications   Medication Sig Start Date End Date Taking? Authorizing Provider  amLODipine (NORVASC) 5 MG tablet TAKE 1 TABLET DAILY 09/08/21   Birdie Sons, MD  amoxicillin-clavulanate (AUGMENTIN) 875-125 MG tablet Take 1 tablet by mouth 2 (two) times daily for 7 days. 08/09/22 08/16/22  Birdie Sons, MD  aspirin 81 MG tablet Take 1 tablet by mouth daily. 03/17/09   [provider]  brimonidine-timolol (COMBIGAN) 0.2-0.5 % ophthalmic solution Place 1 drop into both eyes every 12 (twelve) hours.    [provider]  Calcium Carb-Cholecalciferol 600-200 MG-UNIT TABS Take 1 tablet by mouth daily. 03/17/09   [provider]  candesartan (ATACAND) 8 MG tablet TAKE 1 TABLET BY MOUTH EVERY DAY 05/11/22   Birdie Sons, MD  carisoprodol (SOMA) 350 MG tablet Take 1 tablet (350 mg total) by mouth every 6 (six) hours as needed for muscle spasms. 01/03/22   Birdie Sons, MD  cholestyramine light (PREVALITE) 4 GM/DOSE powder MIX 2 GRAMS WITH FLUIDS AND DRINK EVERY 2-3 DAYS AS NEEDED FOR LOOSE BOWELS Patient taking differently: MIX 2 GRAMS WITH FLUIDS AND DRINK EVERY 2-3  DAYS AS NEEDED FOR LOOSE BOWELS 02/04/20   Birdie Sons, MD  diclofenac Sodium (VOLTAREN) 1 % GEL Apply topically 4 (four) times daily.    [provider]  diphenhydrAMINE (BENADRYL) 25 mg capsule Take 25 mg by mouth at bedtime.     [provider]  diphenoxylate-atropine (LOMOTIL) 2.5-0.025 MG tablet Take 1 tablet by mouth 4 (four) times daily as needed. 07/23/19   Birdie Sons, MD  fentaNYL (DURAGESIC) 50 MCG/HR Place 1 patch onto the skin every 3 (three) days. 07/27/22   Birdie Sons, MD  FLUoxetine (PROZAC) 20 MG capsule TAKE 1 CAPSULE BY MOUTH EVERY DAY 08/04/22   Birdie Sons, MD  furosemide (LASIX) 40 MG tablet TAKE 1 TABLET DAILY AS     NEEDED FOR SWELLING 01/03/22   Birdie Sons, MD  hydrochlorothiazide (HYDRODIURIL) 12.5 MG tablet TAKE 1 TABLET DAILY 03/07/22   Birdie Sons, MD  HYDROcodone-acetaminophen (NORCO) 7.5-325 MG tablet Take 1 tablet by mouth every 6 (six) hours as needed for moderate pain. 07/11/22   Birdie Sons, MD  HYDROcodone-acetaminophen (NORCO/VICODIN) 5-325 MG tablet Take 1 tablet by mouth 3 (three) times daily as needed for moderate pain. 07/11/22 04/07/23  Birdie Sons, MD  meloxicam (MOBIC) 15 MG tablet TAKE 1 TABLET BY MOUTH DAILY AS NEEDED. 05/11/22   Birdie Sons, MD  Multiple Vitamins-Minerals (MULTIVITAMIN ADULT PO) Take 1 tablet by mouth daily. 03/17/09   [provider]  omega-3 acid ethyl esters (LOVAZA) 1 g capsule TAKE 4 CAPSULES DAILY 01/03/22   Birdie Sons, MD  oxybutynin (DITROPAN-XL) 5 MG 24 hr tablet TAKE 1 TABLET BY MOUTH EVERYDAY AT BEDTIME 08/31/21   Birdie Sons, MD  pravastatin (PRAVACHOL) 40 MG tablet TAKE 1 TABLET DAILY 10/18/21   Birdie Sons, MD  prednisoLONE acetate (PRED FORTE) 1 % ophthalmic suspension Place 1 drop into the left eye daily.  12/10/18   [provider]    Physical Exam: Vitals:   08/09/22 2300 08/09/22 2330 08/10/22 0020 08/10/22 0030  BP: (!) 119/105 103/71  (!) 139/121 105/67  Pulse: 78 75 83 98  Resp: (!) 30 (!) 22 (!) 25 (!) 21  Temp:      TempSrc:      SpO2: 99% 98% 100% 95%  Weight:      Height:       Physical Exam Vitals and nursing note reviewed.  Constitutional:      General: She is not in acute distress.    Comments: Frail-appearing elderly female, spitting up into a bag  HENT:     Head: Normocephalic and atraumatic.  Cardiovascular:     Rate and Rhythm: Normal rate and regular rhythm.     Heart sounds: Normal heart sounds.  Pulmonary:     Effort: Tachypnea present.     Breath sounds: Normal breath sounds.  Abdominal:     Palpations: Abdomen is soft.     Tenderness: There is no abdominal tenderness.  Neurological:     Mental Status: Mental status is at baseline.     Labs on Admission: I have personally reviewed following labs and imaging studies  CBC: Recent Labs  Lab 08/09/22 2155  WBC 11.9*  HGB 13.3  HCT 38.7  MCV 90.4  PLT 902   Basic Metabolic Panel: Recent Labs  Lab 08/09/22 2155  NA 135  K 3.4*  CL 99  CO2 21*  GLUCOSE 136*  BUN 62*  CREATININE 1.73*  CALCIUM 10.1   GFR: Estimated Creatinine Clearance: 14.4 mL/min (A) (by C-G formula based on SCr of 1.73 mg/dL (H)). Liver Function Tests: No results for input(s): "AST", "ALT", "ALKPHOS", "BILITOT", "PROT", "ALBUMIN" in the last 168 hours. No results for input(s): "LIPASE", "AMYLASE" in the last 168 hours. No results for input(s): "AMMONIA" in the last 168 hours. Coagulation Profile: Recent Labs  Lab 08/09/22 2155  INR 1.1   Cardiac Enzymes: No results for input(s): "CKTOTAL", "CKMB", "CKMBINDEX", "TROPONINI" in the last 168 hours. BNP (last 3 results) Recent Labs    06/22/22 1125  PROBNP 271   HbA1C: No results for input(s): "HGBA1C" in the last 72 hours. CBG: No results for input(s): "GLUCAP" in the last 168 hours. Lipid Profile: No results for input(s): "CHOL", "HDL", "LDLCALC", "TRIG", "CHOLHDL", "LDLDIRECT" in the last 72  hours. Thyroid Function Tests: No results for input(s): "TSH", "T4TOTAL", "FREET4", "T3FREE", "THYROIDAB" in the last 72 hours. Anemia Panel: No results for input(s): "VITAMINB12", "FOLATE", "FERRITIN", "TIBC", "IRON", "RETICCTPCT" in the last 72 hours. Urine analysis: No results found for: "COLORURINE", "APPEARANCEUR", "LABSPEC", "PHURINE", "GLUCOSEU", "HGBUR", "BILIRUBINUR", "KETONESUR", "PROTEINUR", "UROBILINOGEN", "NITRITE", "LEUKOCYTESUR"  Radiological Exams on Admission: CT Angio Chest/Abd/Pel for Dissection W and/or Wo Contrast  Result Date: 08/10/2022 CLINICAL DATA:  Chest pain or back pain, aortic dissection suspected. Pt has been having chest pain today, has been having a cough for several weeks and has coughing episodes, but none that have ever lasted all day, pt pt has not even been able to drink liquids, pt just continues to cough EXAM: CT ANGIOGRAPHY CHEST, ABDOMEN AND PELVIS TECHNIQUE: Non-contrast CT of the chest was initially obtained. Multidetector CT imaging through the chest, abdomen and pelvis was performed using the standard protocol during bolus administration of intravenous contrast. Multiplanar reconstructed images and MIPs were obtained and reviewed to evaluate the vascular anatomy. RADIATION DOSE REDUCTION: This exam was performed according to the departmental dose-optimization program which includes automated exposure control, adjustment of the mA and/or kV according to patient size and/or use of iterative reconstruction technique. CONTRAST:  23m OMNIPAQUE IOHEXOL 350 MG/ML SOLN COMPARISON:  CT abdomen pelvis 01/07/2005 FINDINGS: CTA CHEST FINDINGS Cardiovascular: Preferential opacification of the thoracic aorta. No evidence of thoracic aortic aneurysm or dissection. Normal heart size. No significant pericardial effusion. Severe atherosclerotic plaque of the thoracic aorta. At least 3 vessel coronary artery calcifications. The main pulmonary artery is normal in caliber. No  central pulmonary embolus. Unable to evaluate more distally due to timing of contrast. Mediastinum/Nodes: No enlarged mediastinal, hilar, or axillary lymph nodes. Thyroid gland, trachea, and esophagus demonstrate no significant findings. Interval increase in size of a small volume hiatal hernia. Lungs/Pleura: No focal consolidation. Left apical pulmonary micronodule. No follow-up needed if patient is low-risk.This recommendation follows the consensus statement: Guidelines for Management of Incidental Pulmonary Nodules Detected on CT Images: From the Fleischner Society 2017; Radiology 2017; 284:228-243.  No pulmonary mass. No pleural effusion. No pneumothorax. Musculoskeletal: No chest wall abnormality. No suspicious lytic or blastic osseous lesions. No acute displaced fracture. Multilevel degenerative changes of the spine. Review of the MIP images confirms the above findings. CTA ABDOMEN AND PELVIS FINDINGS VASCULAR Aorta: Atherosclerotic plaque. Normal caliber aorta without aneurysm, dissection, vasculitis or significant stenosis. Celiac: Atherosclerotic plaque of its origin. Patent without evidence of aneurysm, dissection, vasculitis or significant stenosis. SMA: Atherosclerotic plaque of its origin. Replaced common hepatic artery off of the superior mesenteric artery. Patent without evidence of aneurysm, dissection, vasculitis or significant stenosis. Renals: Atherosclerotic plaque of the origin. Both renal arteries are patent without evidence of aneurysm, dissection, vasculitis, fibromuscular dysplasia or significant stenosis. IMA: Patent without evidence of aneurysm, dissection, vasculitis or significant stenosis. Inflow: Mild atherosclerotic plaque. Patent without evidence of aneurysm, dissection, vasculitis or significant stenosis. Veins: No obvious venous abnormality within the limitations of this arterial phase study. Review of the MIP images confirms the above findings. NON-VASCULAR Hepatobiliary: The liver  is borderline enlarged up to 18 cm. No focal liver abnormality. Status post cholecystectomy. No biliary dilatation. Pancreas: No focal lesion. Normal pancreatic contour. No surrounding inflammatory changes. No main pancreatic ductal dilatation. Spleen: Normal in size without focal abnormality. Adrenals/Urinary Tract: Hyperplastic left adrenal gland.  No adrenal nodule bilaterally. Bilateral kidneys enhance symmetrically. No hydronephrosis. No hydroureter. The urinary bladder is unremarkable. Stomach/Bowel: Stomach is within normal limits. No evidence of small bowel wall thickening or dilatation. Mobile cecum residing within the rectouterine pouch noted to be dilated up to 8 cm with fluid. No associated fat stranding or bowel wall thickening. No pneumatosis. Appendix appears normal. Lymphatic: No lymphadenopathy. Reproductive: Status post hysterectomy. No adnexal masses. Other: Similar multiple surgical clips noted within the abdomen. No intraperitoneal free fluid. No intraperitoneal free gas. No organized fluid collection. Musculoskeletal: No abdominal wall hernia or abnormality. No suspicious lytic or blastic osseous lesions. No acute displaced fracture. Severe degenerative changes right hip. Grade 2 anterolisthesis of L5 on S1 in the setting of bilateral L5 pars interarticularis defects status post L5-S1 posterolateral and interbody fusion. Review of the MIP images confirms the above findings. IMPRESSION: 1. At least small volume hiatal hernia. Given history, correlate with signs and symptoms of gastric reflux. 2. No acute intrapulmonary abnormality. 3. Mobile cecum residing within the rectouterine pouch dilated up to 8 cm with fluid. No associated bowel thickening, colonic fat stranding, perforation, or obstruction. Indeterminate etiology. Query incidental finding. Electronically Signed   By: Iven Finn M.D.   On: 08/10/2022 00:53   DG Chest Portable 1 View  Result Date: 08/09/2022 CLINICAL DATA:   Increasing shortness of breath. EXAM: PORTABLE CHEST 1 VIEW COMPARISON:  PA and lateral earlier today at 1:59 p.m. FINDINGS: 11:30 p.m. The cardiomediastinal silhouette is within normal limits. There is calcification in the aortic arch. No vascular congestion is seen. The lungs are clear. The sulci are sharp. Osteopenia and degenerative change thoracic spine with lumbar dextroscoliosis partially visible. IMPRESSION: No evidence of acute chest process or interval changes. Electronically Signed   By: Telford Nab M.D.   On: 08/09/2022 23:54   DG Chest 2 View  Result Date: 08/09/2022 CLINICAL DATA:  Cough for 3 weeks EXAM: CHEST - 2 VIEW COMPARISON:  None Available. FINDINGS: The heart size and mediastinal contours are within normal limits. Aortic atherosclerosis. Both lungs are clear. Postsurgical changes of the right humeral head. Scoliosis of the spine. IMPRESSION: No active cardiopulmonary disease. Electronically Signed   By: Maudie Mercury  Francoise Ceo M.D.   On: 08/09/2022 21:50     Data Reviewed: Relevant notes from primary care and specialist visits, past discharge summaries as available in EHR, including Care Everywhere. Prior diagnostic testing as pertinent to current admission diagnoses Updated medications and problem lists for reconciliation ED course, including vitals, labs, imaging, treatment and response to treatment Triage notes, nursing and pharmacy notes and ED provider's notes Notable results as noted in HPI   Assessment and Plan: * NSTEMI (non-ST elevated myocardial infarction) ([place]) Continue heparin infusion Continue aspirin, atorvastatin nitroglycerin sublingual as needed chest pain Holding beta-blocker due to soft BP on arrival Cardiology consult Keep n.p.o. in case of procedure  Cough CTA aorta study showed no infiltrate but did show hiatal hernia Cough could be related to reflux, possible aspiration Consider speech therapy consult We will keep n.p.o. tonight  Essential  (primary) hypertension Hold antihypertensives due to soft blood pressure on admission and resume as appropriate  History of ovarian cancer No acute issues suspected        DVT prophylaxis: Heparin infusion  Consults: [facility] cardiology, Dr. Stanford Breed  Advance Care Planning: DNR  Family Communication: Daughter and POA Mayer Masker at bedside  Disposition Plan: Back to previous home environment  Severity of Illness: The appropriate patient status for this patient is INPATIENT. Inpatient status is judged to be reasonable and necessary in order to provide the required intensity of service to ensure the patient's safety. The patient's presenting symptoms, physical exam findings, and initial radiographic and laboratory data in the context of their chronic comorbidities is felt to place them at high risk for further clinical deterioration. Furthermore, it is not anticipated that the patient will be medically stable for discharge from the hospital within 2 midnights of admission.   * I certify that at the point of admission it is my clinical judgment that the patient will require inpatient hospital care spanning beyond 2 midnights from the point of admission due to high intensity of service, high risk for further deterioration and high frequency of surveillance required.*  Author: Athena Masse, MD 08/10/2022 1:08 AM  For on call review [email or web address].

## 2022-08-10 NOTE — ED Notes (Signed)
MD states not to worry about monitor rhythm unless pt is symptomatic.

## 2022-08-10 NOTE — Assessment & Plan Note (Signed)
Most likely prerenal.  Creatinine at 1.73 with baseline around 1.2-1.3 which makes it CKD stage III A. Patient did receive contrast for CTA. -Continue with IV hydration -Monitor renal function -Avoid nephrotoxins

## 2022-08-10 NOTE — ED Notes (Signed)
MD paged about the pt per bedside cardiac monitor appears to be starting to pop in and out of AFIB. No EKG with AFIB captured at this time.

## 2022-08-10 NOTE — IPAL (Signed)
  Interdisciplinary Goals of Care Family Meeting   Date carried out: 08/10/2022  Location of the meeting: Bedside  Member's involved: Physician, Bedside Registered Nurse, and Family Member or next of kin  Durable Power of Attorney or acting medical decision maker: Mayer Masker    Discussion: We discussed goals of care for [employer] .    Code status: Full DNR  Disposition: Home  Time spent for the meeting: [place], MD  08/10/2022, 2:46 AM

## 2022-08-10 NOTE — Progress Notes (Signed)
ANTICOAGULATION CONSULT NOTE  Pharmacy Consult for heparin infusion Indication: ACS/STEMI  Allergies  Allergen Reactions   Lovastatin    Sertraline Hcl    Etodolac Rash    Patient Measurements: Height: '4\' 11"'$  (149.9 cm) Weight: 48.1 kg (106 lb) IBW/kg (Calculated) : 43.2 Heparin Dosing Weight: 48.1 kg  Vital Signs: Temp: 97.6 F (36.4 C) (09/06 1300) Temp Source: Oral (09/06 1300) BP: 114/79 (09/06 1300) Pulse Rate: 76 (09/06 1300)  Labs: Recent Labs    08/09/22 2155 08/09/22 2329 08/10/22 0859  HGB 13.3  --   --   HCT 38.7  --   --   PLT 351  --   --   APTT 33  --   --   LABPROT 14.1  --   --   INR 1.1  --   --   HEPARINUNFRC  --  <0.10* 0.66  CREATININE 1.73*  --   --   TROPONINIHS 2,107* 2,301*  --      Estimated Creatinine Clearance: 14.4 mL/min (A) (by C-G formula based on SCr of 1.73 mg/dL (H)).   Medical History: Past Medical History:  Diagnosis Date   Depression    Diverticulosis    History of chicken pox    History of Descemet's stripping endothelial keratoplasty (DSEK)    2019 and 2023   History of measles    History of mumps    Hyperlipidemia    Hypertension    Ovarian cancer ([place]) 1995   Medications:  Scheduled:   aspirin  324 mg Oral NOW   Or   aspirin  300 mg Rectal NOW   [START ON 08/11/2022] aspirin EC  81 mg Oral Daily   atorvastatin  40 mg Oral Daily   pantoprazole (PROTONIX) IV  40 mg Intravenous Q24H   Infusions:   heparin 600 Units/hr (08/10/22 0730)   PRN: acetaminophen, LORazepam, morphine injection, nitroGLYCERIN, ondansetron (ZOFRAN) IV  Assessment: NELIAH Salazar is a 88 yo female presenting to ED with worsening cough, subsequent chest pain, & SOB. Not on [facility] PTA per chart review. PMH significant for HTN, arthritis. hsTrop 2107>>2301, EKG revealed NSTEMI. Pharmacy has been consulted to initiate and manage heparin infusion  Baseline Labs: aPTT 33, PT 14.1, INR 1.1, Hgb 13.3, Hct 38.7, Plt 351   Goal of Therapy:   Heparin level 0.3-0.7 units/ml Monitor platelets by anticoagulation protocol: Yes  Plan: heparin level remains therapeutic Continue heparin infusion at 600 units/hr Check level in am Monitor HL & CBC daily while on heparin  Thank you for allowing pharmacy to be a part of this patient's care.  Vallery Sa, PharmD, BCPS 08/10/2022 3:44 PM

## 2022-08-10 NOTE — Assessment & Plan Note (Addendum)
No acute issues suspected. -Outpatient follow-up

## 2022-08-10 NOTE — Assessment & Plan Note (Addendum)
CTA aorta study showed no infiltrate but did show hiatal hernia Cough could be related to reflux, possible aspiration -PPI trial

## 2022-08-10 NOTE — Assessment & Plan Note (Addendum)
Blood pressure currently within goal. -Holding home antihypertensives which include amlodipine, Lasix as needed, HCTZ-can be restarted if needed

## 2022-08-10 NOTE — Hospital Course (Addendum)
Taken from H&P.  Julia Salazar is a 88 y.o. female with medical history significant for HTN, arthritis who presents to the ED with an incessant coughing subsequently followed by chest pain, worse with coughing and associated with shortness of breath.  Patient has had previous coughing spells over the past several months but not as protracted as the current one.  She has associated upper abdominal pain.  Denies fever or chills ED course and data review: BP on arrival 138/106 down to 105/67 at the time of admission.  Respiratory rate 22-30 with O2 sat in the high 90s.  Labs with troponin 2107>>2301.  WBC 11,900, creatinine 1.73 up from baseline of 1.23.  EKG, personally viewed and interpreted showing NSR at 85 with nonspecific ST-T wave changes CTA aorta shows the following: IMPRESSION: 1. At least small volume hiatal hernia. Given history, correlate with signs and symptoms of gastric reflux. 2. No acute intrapulmonary abnormality. 3. Mobile cecum residing within the rectouterine pouch dilated up to 8 cm with fluid. No associated bowel thickening, colonic fat stranding, perforation, or obstruction. Indeterminate etiology. Query incidental finding.   Patient started on a heparin infusion treated with morphine and nitroglycerin for pain.  Cardiology was consulted for concern of NSTEMI.  9/6: Cardiology ordered echocardiogram which is pending. They are recommending conservative management with continuation of heparin infusion for 48 hours along with statin, aspirin and as needed nitro. No plan for any intervention at this time. Patient continued to have some substernal chest pain going towards the neck with some associated shortness of breath.  9/7: Echocardiogram with EF of 30 to 35%, global hypokinesis and regional wall motion abnormalities involving left ventricular mid apical lateral wall, septal wall and apical region.  Also found to have grade 1 diastolic dysfunction. Troponin peaked at 2301,  now trending down. Renal function seems improving and now at baseline. Potassium at 3.1 which is being repleted. Cardiology after discussing with family decided to proceed with medical management only.  They added low-dose losartan and rest of the medications will be added as an outpatient. They were recommending completion of 48 hours of heparin infusion which will be completed around 1 AM.  9/8: Patient completed 48 hours of heparin.  Remained chest pain-free at rest and with ambulation.  Had another bout of coughing spell which lasted about half an hour this morning, improved with hyoscyamine.  That coughing spell makes her very lethargic afterwards.  Appetite remained little poor.  Patient really wants to go home. Family does not want any aggressive measures or intervention at this time.  Patient is high risk for mortality based on her age, prior comorbidities and recent NSTEMI.  We ordered home health services to provide maximum support at home.  Patient lives alone at baseline but family provide as needed assistance.  Cardiology made some changes to her medications, she is being discharged on aspirin, Lipitor, low-dose metoprolol, Lasix and losartan and need to have a close follow-up with cardiologist for further management of her acute HFrEF secondary to NSTEMI.  Patient will continue on current medications as mentioned above and will follow-up with her providers for further recommendations.

## 2022-08-10 NOTE — Assessment & Plan Note (Addendum)
Cardiology is recommending conservative management, also ordered echocardiogram. -Continue heparin infusion-for 48 hours -Continue aspirin, atorvastatin nitroglycerin sublingual as needed chest pain Holding beta-blocker due to soft BP on arrival -No plan for any intervention at this time. -Trend troponin -Follow-up echocardiogram results

## 2022-08-11 ENCOUNTER — Other Ambulatory Visit: Payer: Self-pay | Admitting: Family Medicine

## 2022-08-11 DIAGNOSIS — K2100 Gastro-esophageal reflux disease with esophagitis, without bleeding: Secondary | ICD-10-CM

## 2022-08-11 DIAGNOSIS — I214 Non-ST elevation (NSTEMI) myocardial infarction: Secondary | ICD-10-CM | POA: Diagnosis not present

## 2022-08-11 DIAGNOSIS — R053 Chronic cough: Secondary | ICD-10-CM

## 2022-08-11 DIAGNOSIS — N189 Chronic kidney disease, unspecified: Secondary | ICD-10-CM

## 2022-08-11 DIAGNOSIS — I5021 Acute systolic (congestive) heart failure: Secondary | ICD-10-CM

## 2022-08-11 DIAGNOSIS — N179 Acute kidney failure, unspecified: Secondary | ICD-10-CM

## 2022-08-11 DIAGNOSIS — I10 Essential (primary) hypertension: Secondary | ICD-10-CM

## 2022-08-11 LAB — BASIC METABOLIC PANEL
Anion gap: 9 (ref 5–15)
BUN: 39 mg/dL — ABNORMAL HIGH (ref 8–23)
CO2: 23 mmol/L (ref 22–32)
Calcium: 9 mg/dL (ref 8.9–10.3)
Chloride: 106 mmol/L (ref 98–111)
Creatinine, Ser: 1.23 mg/dL — ABNORMAL HIGH (ref 0.44–1.00)
GFR, Estimated: 41 mL/min — ABNORMAL LOW (ref >60)
Glucose, Bld: 114 mg/dL — ABNORMAL HIGH (ref 70–99)
Potassium: 3.1 mmol/L — ABNORMAL LOW (ref 3.5–5.1)
Sodium: 138 mmol/L (ref 135–145)

## 2022-08-11 LAB — CBC
HCT: 34.1 % — ABNORMAL LOW (ref 36.0–46.0)
Hemoglobin: 11.5 g/dL — ABNORMAL LOW (ref 12.0–15.0)
MCH: 30.8 pg (ref 26.0–34.0)
MCHC: 33.7 g/dL (ref 30.0–36.0)
MCV: 91.4 fL (ref 80.0–100.0)
Platelets: 299 10*3/uL (ref 150–400)
RBC: 3.73 MIL/uL — ABNORMAL LOW (ref 3.87–5.11)
RDW: 12 % (ref 11.5–15.5)
WBC: 13.3 10*3/uL — ABNORMAL HIGH (ref 4.0–10.5)
nRBC: 0 % (ref 0.0–0.2)

## 2022-08-11 LAB — LIPOPROTEIN A (LPA): Lipoprotein (a): 274.2 nmol/L — ABNORMAL HIGH (ref <75.0)

## 2022-08-11 LAB — MAGNESIUM: Magnesium: 2 mg/dL (ref 1.7–2.4)

## 2022-08-11 LAB — TROPONIN I (HIGH SENSITIVITY): Troponin I (High Sensitivity): 2218 ng/L (ref <18)

## 2022-08-11 LAB — HEPARIN LEVEL (UNFRACTIONATED): Heparin Unfractionated: 0.51 IU/mL (ref 0.30–0.70)

## 2022-08-11 MED ORDER — PANTOPRAZOLE SODIUM 40 MG PO TBEC: 40.0000 mg | DELAYED_RELEASE_TABLET | Freq: Two times a day (BID) | ORAL | Status: DC

## 2022-08-11 MED ORDER — PANTOPRAZOLE SODIUM 40 MG PO TBEC
40.0000 mg | DELAYED_RELEASE_TABLET | Freq: Two times a day (BID) | ORAL | Status: DC
  Administered 2022-08-11 – 2022-08-12 (×2): 40 mg via ORAL
  Filled 2022-08-11: qty 1

## 2022-08-11 MED ORDER — LOSARTAN POTASSIUM 25 MG PO TABS
12.5000 mg | ORAL_TABLET | Freq: Every day | ORAL | Status: DC
  Administered 2022-08-11 – 2022-08-12 (×2): 12.5 mg via ORAL
  Filled 2022-08-11 (×2): qty 1

## 2022-08-11 MED ORDER — POTASSIUM CHLORIDE CRYS ER 20 MEQ PO TBCR
30.0000 meq | EXTENDED_RELEASE_TABLET | ORAL | Status: AC
  Administered 2022-08-11 (×2): 30 meq via ORAL
  Filled 2022-08-11 (×2): qty 1

## 2022-08-11 MED ORDER — ORAL CARE MOUTH RINSE: 15.0000 mL | OROMUCOSAL | Status: DC | PRN

## 2022-08-11 MED ORDER — PANTOPRAZOLE SODIUM 40 MG PO TBEC: 40.0000 mg | DELAYED_RELEASE_TABLET | Freq: Every day | ORAL | Status: DC

## 2022-08-11 NOTE — Assessment & Plan Note (Signed)
Echocardiogram with EF of 30 to 35% with global hypokinesis and regional wall motion abnormalities.  Most likely secondary to NSTEMI. -Cardiology is recommending conservative management with beta-blocker, Lasix and also added low-dose losartan. -Close outpatient follow-up for further recommendations

## 2022-08-11 NOTE — Assessment & Plan Note (Signed)
Most likely prerenal.  Creatinine at 1.73>>1.23 with baseline around 1.2-1.3 which makes it CKD stage III A. Patient did receive contrast for CTA. AKI resolved. -Monitor renal function -Avoid nephrotoxins

## 2022-08-11 NOTE — Progress Notes (Signed)
The patient is receiving Protonix by the intravenous route.  Based on criteria approved by the Pharmacy and [place], the medication is being converted to the equivalent oral dose form.  These criteria include: -No active GI bleeding -Able to tolerate diet of full liquids (or better) or tube feeding -Able to tolerate other medications by the oral or enteral route   Glean Salvo, PharmD Clinical Pharmacist  08/11/2022 8:34 AM

## 2022-08-11 NOTE — Assessment & Plan Note (Signed)
-   History of small hiatal hernia. -Current continue with PPI

## 2022-08-11 NOTE — Progress Notes (Signed)
Progress Note   Patient: Julia Salazar TFT:[id number] DOB: [birth year] DOA: 08/09/2022     1 DOS: the patient was seen and examined on 08/11/2022   Brief hospital course: Taken from H&P.  Julia Salazar is a 88 y.o. female with medical history significant for HTN, arthritis who presents to the ED with an incessant coughing subsequently followed by chest pain, worse with coughing and associated with shortness of breath.  Patient has had previous coughing spells over the past several months but not as protracted as the current one.  She has associated upper abdominal pain.  Denies fever or chills ED course and data review: BP on arrival 138/106 down to 105/67 at the time of admission.  Respiratory rate 22-30 with O2 sat in the high 90s.  Labs with troponin 2107>>2301.  WBC 11,900, creatinine 1.73 up from baseline of 1.23.  EKG, personally viewed and interpreted showing NSR at 85 with nonspecific ST-T wave changes CTA aorta shows the following: IMPRESSION: 1. At least small volume hiatal hernia. Given history, correlate with signs and symptoms of gastric reflux. 2. No acute intrapulmonary abnormality. 3. Mobile cecum residing within the rectouterine pouch dilated up to 8 cm with fluid. No associated bowel thickening, colonic fat stranding, perforation, or obstruction. Indeterminate etiology. Query incidental finding.   Patient started on a heparin infusion treated with morphine and nitroglycerin for pain.  Cardiology was consulted for concern of NSTEMI.  9/6: Cardiology ordered echocardiogram which is pending. They are recommending conservative management with continuation of heparin infusion for 48 hours along with statin, aspirin and as needed nitro. No plan for any intervention at this time. Patient continued to have some substernal chest pain going towards the neck with some associated shortness of breath.  9/7: Echocardiogram with EF of 30 to 35%, global hypokinesis and regional wall motion  abnormalities involving left ventricular mid apical lateral wall, septal wall and apical region.  Also found to have grade 1 diastolic dysfunction. Troponin peaked at 2301, now trending down. Renal function seems improving and now at baseline. Potassium at 3.1 which is being repleted. Cardiology after discussing with family decided to proceed with medical management only.  They added low-dose losartan and rest of the medications will be added as an outpatient. They were recommending completion of 48 hours of heparin infusion which will be completed around 1 AM. Patient remained chest pain-free-most [place] be discharged tomorrow.   Assessment and Plan: * NSTEMI (non-ST elevated myocardial infarction) [facility]) Cardiology is recommending conservative management, also ordered echocardiogram. -Continue heparin infusion-for 48 hours -Continue aspirin, atorvastatin nitroglycerin sublingual as needed chest pain Holding beta-blocker due to soft BP on arrival -No plan for any intervention at this time. -Trend troponin -Follow-up echocardiogram results  Acute HFrEF (heart failure with reduced ejection fraction) (HCC) Echocardiogram with EF of 30 to 35% with global hypokinesis and regional wall motion abnormalities.  Most likely secondary to NSTEMI. -Cardiology is recommending conservative management with beta-blocker, Lasix and also added low-dose losartan. -Close outpatient follow-up for further recommendations  Acute kidney injury superimposed on chronic kidney disease ([place]) Most likely prerenal.  Creatinine at 1.73>>1.23 with baseline around 1.2-1.3 which makes it CKD stage III A. Patient did receive contrast for CTA. AKI resolved. -Monitor renal function -Avoid nephrotoxins  Cough CTA aorta study showed no infiltrate but did show hiatal hernia Cough could be related to reflux, possible aspiration -PPI trial  Essential (primary) hypertension Blood pressure currently within  goal. -Cardiology added low-dose losartan and we will discontinue  home amlodipine. -Continue low-dose Lasix per cardiology  History of ovarian cancer No acute issues suspected. -Outpatient follow-up  Gastroesophageal reflux disease with esophagitis without hemorrhage - History of small hiatal hernia. -Current continue with PPI   Subjective: Patient was seen and examined today.  Denies any chest pain.  She was concerned about her hiatal hernia and GERD symptoms.  Physical Exam: Vitals:   08/11/22 0356 08/11/22 0830 08/11/22 1206 08/11/22 1604  BP: 126/62 130/76 124/83 105/73  Pulse: 99 95 95 76  Resp: '16 16 16 16  '$ Temp: 97.8 F (36.6 C) (!) 97.2 F (36.2 C) (!) 97.3 F (36.3 C) 98.6 F (37 C)  TempSrc:  Oral    SpO2: 91% 96% 96% 95%  Weight:      Height:       General.  Pleasant elderly lady, in no acute distress. Pulmonary.  Lungs clear bilaterally, normal respiratory effort. CV.  Regular rate and rhythm, no JVD, rub or murmur. Abdomen.  Soft, nontender, nondistended, BS positive. CNS.  Alert and oriented .  No focal neurologic deficit. Extremities.  No edema, no cyanosis, pulses intact and symmetrical. Psychiatry.  Judgment and insight appears normal.  Data Reviewed: Prior data reviewed  Family Communication: Multiple family members at bedside  Disposition: Status is: Inpatient Remains inpatient appropriate because: Severity of illness, need to complete 48 hours of heparin infusion   Planned Discharge Destination: Home  Time spent: 45 minutes  This record has been created using Systems analyst. Errors have been sought and corrected,but may not always be located. Such creation errors do not reflect on the standard of care.  Author: Lorella Nimrod, MD 08/11/2022 4:29 PM  For on call review [email or web address].

## 2022-08-11 NOTE — Progress Notes (Signed)
Mobility Specialist - Progress Note   08/11/22 1400  Mobility  Activity Ambulated with assistance in room  Level of Assistance Contact guard assist, steadying assist  Assistive Device Front wheel walker  Distance Ambulated (ft) 10 ft  Activity Response Tolerated well  $Mobility charge 1 Mobility   Pt sitting in chair upon entry, utilizing RA. Pt completed chair mob CGA. Pt ambulated from chair to door in room using RW CGA, tolerated well. Pt left supine with needs within reach, family present at bedside.   Candie [place] Mobility Specialist 08/11/22 2:18 PM

## 2022-08-11 NOTE — Progress Notes (Signed)
Rounding Note    Patient Name: Julia Salazar Date of Encounter: 08/11/2022  [place] Cardiologist: new to [facility] Cardiology  Subjective   On rounds today, reports having some back discomfort Little bit of nausea this morning, denies chest pain Daughter at the bedside, long discussion concerning recent indigestion symptoms Worsening indigestion over the past several days the symptoms dating back several months.  Feels it is from her hiatal hernia CT scan results reviewed showing small volume hiatal hernia On heparin infusion  Inpatient Medications    Scheduled Meds:  aspirin EC  81 mg Oral Daily   atorvastatin  40 mg Oral Daily   furosemide  20 mg Oral Daily   metoprolol succinate  12.5 mg Oral Daily   pantoprazole  40 mg Oral BID AC   Continuous Infusions:  heparin 600 Units/hr (08/10/22 0730)   PRN Meds: acetaminophen, LORazepam, morphine injection, nitroGLYCERIN, ondansetron (ZOFRAN) IV, mouth rinse   Vital Signs    Vitals:   08/10/22 2342 08/11/22 0356 08/11/22 0830 08/11/22 1206  BP: 106/64 126/62 130/76 124/83  Pulse:  99 95 95  Resp: '16 16 16 16  '$ Temp: 97.6 F (36.4 C) 97.8 F (36.6 C) (!) 97.2 F (36.2 C)   TempSrc: Oral  Oral   SpO2: 95% 91% 96% 96%  Weight:      Height:        Intake/Output Summary (Last 24 hours) at 08/11/2022 1232 Last data filed at 08/11/2022 1026 Gross per 24 hour  Intake 439.73 ml  Output 450 ml  Net -10.27 ml      08/09/2022    9:48 PM 08/09/2022    1:20 PM 07/11/2022    9:55 AM  Last 3 Weights  Weight (lbs) 106 lb 106 lb 110 lb  Weight (kg) 48.081 kg 48.081 kg 49.896 kg      Telemetry    Normal sinus rhythm- Personally Reviewed  ECG     - Personally Reviewed  Physical Exam   GEN: No acute distress.  Frail, thin Neck: No JVD Cardiac: RRR, no murmurs, rubs, or gallops.  Respiratory: Clear to auscultation bilaterally. GI: Soft, nontender, non-distended  MS: No edema; No deformity. Neuro:  Nonfocal   Psych: Normal affect   Labs    High Sensitivity Troponin:   Recent Labs  Lab 08/09/22 2155 08/09/22 2329 08/11/22 0334  TROPONINIHS 2,107* 2,301* 2,218*     Chemistry Recent Labs  Lab 08/09/22 2155 08/11/22 0334  NA 135 138  K 3.4* 3.1*  CL 99 106  CO2 21* 23  GLUCOSE 136* 114*  BUN 62* 39*  CREATININE 1.73* 1.23*  CALCIUM 10.1 9.0  MG  --  2.0  GFRNONAA 28* 41*  ANIONGAP 15 9    Lipids No results for input(s): "CHOL", "TRIG", "HDL", "LABVLDL", "LDLCALC", "CHOLHDL" in the last 168 hours.  Hematology Recent Labs  Lab 08/09/22 2155 08/11/22 0334  WBC 11.9* 13.3*  RBC 4.28 3.73*  HGB 13.3 11.5*  HCT 38.7 34.1*  MCV 90.4 91.4  MCH 31.1 30.8  MCHC 34.4 33.7  RDW 11.7 12.0  PLT 351 299   Thyroid No results for input(s): "TSH", "FREET4" in the last 168 hours.  BNPNo results for input(s): "BNP", "PROBNP" in the last 168 hours.  DDimer No results for input(s): "DDIMER" in the last 168 hours.   Radiology    ECHOCARDIOGRAM COMPLETE  Result Date: 08/10/2022    ECHOCARDIOGRAM REPORT   Patient Name:   Julia Salazar Date  of Exam: 08/10/2022 Medical Rec #:  [id number]     Height:       59.0 in Accession #:    [id number]    Weight:       106.0 lb Date of Birth:  Jan 04, 1929     BSA:          1.408 m Patient Age:    88 years      BP:           103/67 mmHg Patient Gender: F             HR:           76 bpm. Exam Location:  [facility] Procedure: 2D Echo, Color Doppler, Cardiac Doppler and Strain Analysis Indications:     I21.4 NSTEMI  History:         Patient has no prior history of Echocardiogram examinations.                  Signs/Symptoms:Chest Pain and Shortness of Breath; Risk                  Factors:Hypertension and Dyslipidemia.  Sonographer:     Charmayne Sheer Referring Phys:  [id number] SHERI HAMMOCK Diagnosing Phys: Kate Sable MD  Sonographer Comments: Global longitudinal strain was attempted. IMPRESSIONS  1. Left ventricular ejection fraction, by estimation, is 30 to 35%. The  left ventricle has moderate to severely decreased function. The left ventricle demonstrates regional wall motion abnormalities (see scoring diagram/findings for description). Left ventricular diastolic parameters are consistent with Grade I diastolic dysfunction (impaired relaxation). There is severe hypokinesis of the left ventricular, mid-apical lateral wall, septal wall and apical segment.  2. Right ventricular systolic function is normal. The right ventricular size is normal. Mildly increased right ventricular wall thickness.  3. The mitral valve is degenerative. Mild to moderate mitral valve regurgitation.  4. The aortic valve was not well visualized. Aortic valve regurgitation is trivial. Aortic valve sclerosis/calcification is present, without any evidence of aortic stenosis.  5. The inferior vena cava is normal in size with greater than 50% respiratory variability, suggesting right atrial pressure of 3 mmHg. FINDINGS  Left Ventricle: Left ventricular ejection fraction, by estimation, is 30 to 35%. The left ventricle has moderate to severely decreased function. The left ventricle demonstrates regional wall motion abnormalities. Severe hypokinesis of the left ventricular, mid-apical lateral wall, septal wall and apical segment. The left ventricular internal cavity size was normal in size. There is no left ventricular hypertrophy. Left ventricular diastolic parameters are consistent with Grade I diastolic dysfunction (impaired relaxation). Right Ventricle: The right ventricular size is normal. Mildly increased right ventricular wall thickness. Right ventricular systolic function is normal. Left Atrium: Left atrial size was normal in size. Right Atrium: Right atrial size was normal in size. Pericardium: There is no evidence of pericardial effusion. Mitral Valve: The mitral valve is degenerative in appearance. Mild to moderate mitral annular calcification. Mild to moderate mitral valve regurgitation. Tricuspid  Valve: The tricuspid valve is grossly normal. Tricuspid valve regurgitation is mild. Aortic Valve: The aortic valve was not well visualized. Aortic valve regurgitation is trivial. Aortic valve sclerosis/calcification is present, without any evidence of aortic stenosis. Aortic valve mean gradient measures 4.0 mmHg. Aortic valve peak gradient measures 6.7 mmHg. Aortic valve area, by VTI measures 2.18 cm. Pulmonic Valve: The pulmonic valve was grossly normal. Pulmonic valve regurgitation is mild. Aorta: The aortic root and ascending aorta are structurally normal, with no evidence of dilitation. Venous:  The inferior vena cava is normal in size with greater than 50% respiratory variability, suggesting right atrial pressure of 3 mmHg. IAS/Shunts: No atrial level shunt detected by color flow Doppler.  LEFT VENTRICLE PLAX 2D LVIDd:         3.39 cm   Diastology LVIDs:         2.29 cm   LV e' medial:    4.35 cm/s LV PW:         1.11 cm   LV E/e' medial:  19.3 LV IVS:        1.00 cm   LV e' lateral:   4.35 cm/s LVOT diam:     2.00 cm   LV E/e' lateral: 19.3 LV SV:         62 LV SV Index:   44 LVOT Area:     3.14 cm  RIGHT VENTRICLE RV Basal diam:  2.63 cm LEFT ATRIUM             Index        RIGHT ATRIUM          Index LA diam:        2.90 cm 2.06 cm/m   RA Area:     6.80 cm LA Vol (A2C):   38.2 ml 27.13 ml/m  RA Volume:   9.45 ml  6.71 ml/m LA Vol (A4C):   54.0 ml 38.35 ml/m LA Biplane Vol: 46.4 ml 32.95 ml/m  AORTIC VALVE                     PULMONIC VALVE AV Area (Vmax):    2.37 cm      PV Vmax:       0.88 m/s AV Area (Vmean):   2.03 cm      PV Peak grad:  3.1 mmHg AV Area (VTI):     2.18 cm AV Vmax:           129.00 cm/s AV Vmean:          100.000 cm/s AV VTI:            0.283 m AV Peak Grad:      6.7 mmHg AV Mean Grad:      4.0 mmHg LVOT Vmax:         97.30 cm/s LVOT Vmean:        64.600 cm/s LVOT VTI:          0.196 m LVOT/AV VTI ratio: 0.69  AORTA Ao Root diam: 2.70 cm MITRAL VALVE MV Area (PHT): 5.50 cm      SHUNTS MV Decel Time: 138 msec     Systemic VTI:  0.20 m MV E velocity: 84.00 cm/s   Systemic Diam: 2.00 cm MV A velocity: 107.00 cm/s MV E/A ratio:  0.79 Kate Sable MD Electronically signed by Kate Sable MD Signature Date/Time: 08/10/2022/3:32:10 PM    Final    CT Angio Chest/Abd/Pel for Dissection W and/or Wo Contrast  Result Date: 08/10/2022 CLINICAL DATA:  Chest pain or back pain, aortic dissection suspected. Pt has been having chest pain today, has been having a cough for several weeks and has coughing episodes, but none that have ever lasted all day, pt pt has not even been able to drink liquids, pt just continues to cough EXAM: CT ANGIOGRAPHY CHEST, ABDOMEN AND PELVIS TECHNIQUE: Non-contrast CT of the chest was initially obtained. Multidetector CT imaging through the chest, abdomen and pelvis was performed using the standard protocol during  bolus administration of intravenous contrast. Multiplanar reconstructed images and MIPs were obtained and reviewed to evaluate the vascular anatomy. RADIATION DOSE REDUCTION: This exam was performed according to the departmental dose-optimization program which includes automated exposure control, adjustment of the mA and/or kV according to patient size and/or use of iterative reconstruction technique. CONTRAST:  99m OMNIPAQUE IOHEXOL 350 MG/ML SOLN COMPARISON:  CT abdomen pelvis 01/07/2005 FINDINGS: CTA CHEST FINDINGS Cardiovascular: Preferential opacification of the thoracic aorta. No evidence of thoracic aortic aneurysm or dissection. Normal heart size. No significant pericardial effusion. Severe atherosclerotic plaque of the thoracic aorta. At least 3 vessel coronary artery calcifications. The main pulmonary artery is normal in caliber. No central pulmonary embolus. Unable to evaluate more distally due to timing of contrast. Mediastinum/Nodes: No enlarged mediastinal, hilar, or axillary lymph nodes. Thyroid gland, trachea, and esophagus demonstrate no  significant findings. Interval increase in size of a small volume hiatal hernia. Lungs/Pleura: No focal consolidation. Left apical pulmonary micronodule. No follow-up needed if patient is low-risk.This recommendation follows the consensus statement: Guidelines for Management of Incidental Pulmonary Nodules Detected on CT Images: From the Fleischner Society 2017; Radiology 2017; 284:228-243. No pulmonary mass. No pleural effusion. No pneumothorax. Musculoskeletal: No chest wall abnormality. No suspicious lytic or blastic osseous lesions. No acute displaced fracture. Multilevel degenerative changes of the spine. Review of the MIP images confirms the above findings. CTA ABDOMEN AND PELVIS FINDINGS VASCULAR Aorta: Atherosclerotic plaque. Normal caliber aorta without aneurysm, dissection, vasculitis or significant stenosis. Celiac: Atherosclerotic plaque of its origin. Patent without evidence of aneurysm, dissection, vasculitis or significant stenosis. SMA: Atherosclerotic plaque of its origin. Replaced common hepatic artery off of the superior mesenteric artery. Patent without evidence of aneurysm, dissection, vasculitis or significant stenosis. Renals: Atherosclerotic plaque of the origin. Both renal arteries are patent without evidence of aneurysm, dissection, vasculitis, fibromuscular dysplasia or significant stenosis. IMA: Patent without evidence of aneurysm, dissection, vasculitis or significant stenosis. Inflow: Mild atherosclerotic plaque. Patent without evidence of aneurysm, dissection, vasculitis or significant stenosis. Veins: No obvious venous abnormality within the limitations of this arterial phase study. Review of the MIP images confirms the above findings. NON-VASCULAR Hepatobiliary: The liver is borderline enlarged up to 18 cm. No focal liver abnormality. Status post cholecystectomy. No biliary dilatation. Pancreas: No focal lesion. Normal pancreatic contour. No surrounding inflammatory changes. No main  pancreatic ductal dilatation. Spleen: Normal in size without focal abnormality. Adrenals/Urinary Tract: Hyperplastic left adrenal gland.  No adrenal nodule bilaterally. Bilateral kidneys enhance symmetrically. No hydronephrosis. No hydroureter. The urinary bladder is unremarkable. Stomach/Bowel: Stomach is within normal limits. No evidence of small bowel wall thickening or dilatation. Mobile cecum residing within the rectouterine pouch noted to be dilated up to 8 cm with fluid. No associated fat stranding or bowel wall thickening. No pneumatosis. Appendix appears normal. Lymphatic: No lymphadenopathy. Reproductive: Status post hysterectomy. No adnexal masses. Other: Similar multiple surgical clips noted within the abdomen. No intraperitoneal free fluid. No intraperitoneal free gas. No organized fluid collection. Musculoskeletal: No abdominal wall hernia or abnormality. No suspicious lytic or blastic osseous lesions. No acute displaced fracture. Severe degenerative changes right hip. Grade 2 anterolisthesis of L5 on S1 in the setting of bilateral L5 pars interarticularis defects status post L5-S1 posterolateral and interbody fusion. Review of the MIP images confirms the above findings. IMPRESSION: 1. At least small volume hiatal hernia. Given history, correlate with signs and symptoms of gastric reflux. 2. No acute intrapulmonary abnormality. 3. Mobile cecum residing within the rectouterine pouch dilated up to  8 cm with fluid. No associated bowel thickening, colonic fat stranding, perforation, or obstruction. Indeterminate etiology. Query incidental finding. Electronically Signed   By: Iven Finn M.D.   On: 08/10/2022 00:53   DG Chest Portable 1 View  Result Date: 08/09/2022 CLINICAL DATA:  Increasing shortness of breath. EXAM: PORTABLE CHEST 1 VIEW COMPARISON:  PA and lateral earlier today at 1:59 p.m. FINDINGS: 11:30 p.m. The cardiomediastinal silhouette is within normal limits. There is calcification in  the aortic arch. No vascular congestion is seen. The lungs are clear. The sulci are sharp. Osteopenia and degenerative change thoracic spine with lumbar dextroscoliosis partially visible. IMPRESSION: No evidence of acute chest process or interval changes. Electronically Signed   By: Telford Nab M.D.   On: 08/09/2022 23:54   DG Chest 2 View  Result Date: 08/09/2022 CLINICAL DATA:  Cough for 3 weeks EXAM: CHEST - 2 VIEW COMPARISON:  None Available. FINDINGS: The heart size and mediastinal contours are within normal limits. Aortic atherosclerosis. Both lungs are clear. Postsurgical changes of the right humeral head. Scoliosis of the spine. IMPRESSION: No active cardiopulmonary disease. Electronically Signed   By: Donavan Foil M.D.   On: 08/09/2022 21:50    Cardiac Studies   Echo performed yesterday . Left ventricular ejection fraction, by estimation, is 30 to 35%. The  left ventricle has moderate to severely decreased function. The left  ventricle demonstrates regional wall motion abnormalities (see scoring  diagram/findings for description). Left  ventricular diastolic parameters are consistent with Grade I diastolic  dysfunction (impaired relaxation). There is severe hypokinesis of the left  ventricular, mid-apical lateral wall, septal wall and apical segment.   2. Right ventricular systolic function is normal. The right ventricular  size is normal. Mildly increased right ventricular wall thickness.   3. The mitral valve is degenerative. Mild to moderate mitral valve  regurgitation.   4. The aortic valve was not well visualized. Aortic valve regurgitation  is trivial. Aortic valve sclerosis/calcification is present, without any  evidence of aortic stenosis.   5. The inferior vena cava is normal in size with greater than 50%  respiratory variability, suggesting right atrial pressure of 3 mmHg.   Patient Profile     88 year old female, retired Therapist, sports with history of hypertension,  hyperlipidemia, CKD presenting with 2-week history of cough/GERD  Assessment & Plan    Chronic cough Concern for GERD, No clear signs of decompensated CHF Continue omeprazole daily,  Pepcid for breakthrough symptoms  levated troponin/NSTEMI Ejection fraction estimated 30 to 35%, Patient declining ischemic work-up Troponin elevation greater than 2000 Would complete heparin 48 hours Currently asymptomatic Continue Lipitor, aspirin, beta-blocker  close outpatient follow-up For recurrent anginal symptoms could consider ischemic work-up though she would be high risk of complication given her age and debility.  Patient and daughter aware, prefer medical management if possible  Cardiomyopathy EF 30 to 35% Presumed to be ischemic, patient has declined ischemic work-up On metoprolol succinate, would initiate losartan 12.5 daily, continue Lasix 20 daily Additional medication titration as outpatient  Long discussion with patient and daughter at the bedside concerning recent events, medications, testing,  management, plan for follow-up  Total encounter time more than 50 minutes  Greater than 50% was spent in counseling and coordination of care with the patient     For questions or updates, please contact [place] Please consult [email or web address] for contact info under        Signed, Ida Rogue, MD  08/11/2022, 12:32 PM

## 2022-08-11 NOTE — TOC CM/SW Note (Signed)
  Transition of Care [facility]) Screening Note   Patient Details  Name: Julia Salazar Date of Birth: September 08, 1929   [facility] CM/SW Contact:    Candie Chroman, LCSW Phone Number: 08/11/2022, 1:20 PM    Transition of Care Department [facility]) has reviewed patient and no TOC needs have been identified at this time. We will continue to monitor patient advancement through interdisciplinary progression rounds. If new patient transition needs arise, please place a TOC consult.

## 2022-08-11 NOTE — Assessment & Plan Note (Signed)
Blood pressure currently within goal. -Cardiology added low-dose losartan and we will discontinue home amlodipine. -Continue low-dose Lasix per cardiology

## 2022-08-11 NOTE — Progress Notes (Signed)
ANTICOAGULATION CONSULT NOTE  Pharmacy Consult for heparin infusion Indication: ACS/STEMI  Allergies  Allergen Reactions   Lovastatin    Sertraline Hcl    Etodolac Rash    Patient Measurements: Height: '4\' 11"'$  (149.9 cm) Weight: 48.1 kg (106 lb) IBW/kg (Calculated) : 43.2 Heparin Dosing Weight: 48.1 kg  Vital Signs: Temp: 97.8 F (36.6 C) (09/07 0356) Temp Source: Oral (09/06 2342) BP: 126/62 (09/07 0356) Pulse Rate: 99 (09/07 0356)  Labs: Recent Labs    08/09/22 2155 08/09/22 2329 08/09/22 2329 08/10/22 0859 08/10/22 2015 08/11/22 0334  HGB 13.3  --   --   --   --  11.5*  HCT 38.7  --   --   --   --  34.1*  PLT 351  --   --   --   --  299  APTT 33  --   --   --   --   --   LABPROT 14.1  --   --   --   --   --   INR 1.1  --   --   --   --   --   HEPARINUNFRC  --  <0.10*   < > 0.66 0.63 0.51  CREATININE 1.73*  --   --   --   --  1.23*  TROPONINIHS 2,107* 2,301*  --   --   --   --    < > = values in this interval not displayed.     Estimated Creatinine Clearance: 20.3 mL/min (A) (by C-G formula based on SCr of 1.23 mg/dL (H)).   Medical History: Past Medical History:  Diagnosis Date   Depression    Diverticulosis    History of chicken pox    History of Descemet's stripping endothelial keratoplasty (DSEK)    2019 and 2023   History of measles    History of mumps    Hyperlipidemia    Hypertension    Ovarian cancer ([place]) 1995   Medications:  Scheduled:   aspirin EC  81 mg Oral Daily   atorvastatin  40 mg Oral Daily   furosemide  20 mg Oral Daily   metoprolol succinate  12.5 mg Oral Daily   pantoprazole (PROTONIX) IV  40 mg Intravenous Q24H   Infusions:   heparin 600 Units/hr (08/10/22 0730)   PRN: acetaminophen, LORazepam, morphine injection, nitroGLYCERIN, ondansetron (ZOFRAN) IV, mouth rinse  Assessment: Julia Salazar is a 88 yo female presenting to ED with worsening cough, subsequent chest pain, & SOB. Not on [facility] PTA per chart review. PMH  significant for HTN, arthritis. hsTrop 2107>>2301, EKG revealed NSTEMI. Pharmacy has been consulted to initiate and manage heparin infusion  Baseline Labs: aPTT 33, PT 14.1, INR 1.1, Hgb 13.3, Hct 38.7, Plt 351   Goal of Therapy:  Heparin level 0.3-0.7 units/ml Monitor platelets by anticoagulation protocol: Yes  Plan: heparin level remains therapeutic Continue heparin infusion at 600 units/hr Check level in am Monitor HL & CBC daily while on heparin  Thank you for allowing pharmacy to be a part of this patient's care.  Renda Rolls, PharmD, [facility] 08/11/2022 5:31 AM

## 2022-08-11 NOTE — Evaluation (Signed)
Clinical/Bedside Swallow Evaluation Patient Details  Name: Julia Salazar MRN: [id number] Date of Birth: [birth year]  Today's Date: 08/11/2022 Time: SLP Start Time (ACUTE ONLY): 1000 SLP Stop Time (ACUTE ONLY): 1100 SLP Time Calculation (min) (ACUTE ONLY): 60 min  Past Medical History:  Past Medical History:  Diagnosis Date   Depression    Diverticulosis    History of chicken pox    History of Descemet's stripping endothelial keratoplasty (DSEK)    2019 and 2023   History of measles    History of mumps    Hyperlipidemia    Hypertension    Ovarian cancer ([place]) 1995   Past Surgical History:  Past Surgical History:  Procedure Laterality Date   [place] SURGERY  05/2012   done at [facility] hospital in [place]  2010   right eye   [place] Left 08/30/2018   Alvie Heidelberg, MD [facility]   OOPHORECTOMY     HPI:  Pt  is a 88 y.o. female with medical history significant for HTN, arthritis who presents to the ED with an incessant coughing subsequently followed by chest pain, worse with coughing and associated with shortness of breath.  Concern for NSTEMI as well.  Patient has had previous coughing spells over the past several months but not as protracted as the current one.  She has associated upper abdominal pain.  Denies fever or chills.  Cardiology following: Echocardiogram with EF of 30 to 35%, global hypokinesis and regional wall motion abnormalities involving left ventricular mid apical lateral wall, septal wall and apical region.  Also found to have grade 1 diastolic dysfunction.  Troponin peaked at 2301, now trending down.  Renal function seems improving and now at baseline.  Potassium at 3.1 which is being repleted.  Cardiology after discussing with family decided  to proceed with medical management only.  CT of Chest/CXR: Lungs/Pleura: No focal consolidation.  OF NOTE: a Hiatal Hernia was noted on CT.    Assessment / Plan / Recommendation  Clinical Impression   Pt seen for BSE this morning w/ f/u education post Lunch w/ Daughter present; Family present this morning as well. Pt A/O x4; engaged easily and eager to learn any information to "understand this better".  Afebrile, WBC min elevated.    OF NOTE: Pt endorses s/s of REFLUX at home; w/ episodes of coughing and having to spit up/out increased phlegm(white, bubbly, stringy). She also endorses feelings of fullness around/after meals, belching/coughing. CT of Chest: "no focal consolidation; Hiatal Hernia".   Pt appears to present w/ adequate oropharyngeal phase swallowing function w/ No overt oropharyngeal phase dysphagia appreciated during oral intake of trials w/ this SLP, including po's during meals w/ Family and Pills Crushed in Puree w/ NSG(new strategy d/t difficulty swallowing pills noted by pt). No neuromuscular swallowing deficits appreciated.  Pt appears at reduced risk for aspiration from an oropharyngeal phase standpoint following general aspiration precautions. HOWEVER, pt has a baseline presentation of REFLUX and episodes of REFLUX behavior, w/ Hiatal Hernia. She is not on a PPI -- this has been requested to MD. ANY Dysmotility or Regurgitation of Reflux material can increase risk for aspiration of the Reflux material during Retrograde flow thus impact Voicing and Pulmonary status. Pt described issues of  globus, hacking cough w/ phlegm.    Pt was sitting in chair and fed self trials of thin liquids Via Cup/Straw, purees w/ NSG. No immediate, overt clinical s/s of aspiration noted; clear vocal quality b/t trials, no decline in pulmonary status, no multiple swallows noted post initial pharyngeal swallow. Oral phase appeared [facility] for bolus management/control and timely A-P transfer/clearing of material.  OM exam was [facility] for oral clearing; lingual/labial movements. No unilateral weakness. Speech clear.    Recommend continue a fairly Regular diet (moistened, cut foods) w/ thin liquids. General aspiration precautions. Rest Breaks during meals/oral intake to allow for Esophageal clearing. REFLUX precautions strongly recommended to lessen chance for Regurgitation --  do not lie down post meals for ~45-60 mins and HOB elevated at night when sleeping(pt already doing per report). MD has initiated a PPI for pt -- this was discussed w/ pt/family also.   Recommend pt f/u w/ GI for assessment/management of Reflux and tx as desired. Discussion and Handouts given on REFLUX, impact of REFLUX on swallowing, behaviors to manage REFLUX, and foods/diet. MD to reconsult ST services if any new needs while admitted. NSG updated. Pt and Family appreciative of Education information. SLP Visit Diagnosis: Dysphagia, unspecified (R13.10) (pt endorsed Globus feelings w/ oral intake at home; Reflux; Hiatal Hernia)    Aspiration Risk  Mild aspiration risk;Risk for inadequate nutrition/hydration (from Retrograde REFLUX activity)    Diet Recommendation   a fairly Regular diet (moistened, cut foods) w/ thin liquids. Avoid problematic foods. General aspiration precautions. Rest Breaks during meals/oral intake to allow for Esophageal clearing. REFLUX precautions strongly recommended to lessen chance for Regurgitation --  do not lie down post meals for ~45-60 mins  Medication Administration: Crushed with puree (for ease of swallowing)    Other  Recommendations Recommended Consults: Consider GI evaluation;Consider esophageal assessment (Dietician f/u -- all as needed per pt/family) Oral Care Recommendations: Oral care BID;Oral care before and after PO;Patient independent with oral care (setup) Other Recommendations:  (n/a)    Recommendations for follow up therapy are one component of a multi-disciplinary discharge planning process,  led by the attending physician.  Recommendations may be updated based on patient status, additional functional criteria and insurance authorization.  Follow up Recommendations No SLP follow up      Assistance Recommended at Discharge Set up Supervision/Assistance  Functional Status Assessment Patient has had a recent decline in their functional status and/or demonstrates limited ability to make significant improvements in function in a reasonable and predictable amount of time  Frequency and Duration  (n/a)   (n/a)       Prognosis Prognosis for Safe Diet Advancement: Fair Barriers to Reach Goals: Time post onset;Severity of deficits Barriers/Prognosis Comment: pt endorsed Globus feelings w/ oral intake at home; Reflux; Hiatal Hernia      Swallow Study   General Date of Onset: 08/09/22 HPI: Pt  is a 87 y.o. female with medical history significant for HTN, arthritis who presents to the ED with an incessant coughing subsequently followed by chest pain, worse with coughing and associated with shortness of breath.  Concern for NSTEMI as well.  Patient has had previous coughing spells over the past several months but not as protracted as the current one.  She has associated upper abdominal pain.  Denies fever or chills.  Cardiology following: Echocardiogram with EF of 30 to 35%, global hypokinesis and regional wall motion abnormalities involving left ventricular mid apical lateral wall, septal wall and apical region.  Also found to  have grade 1 diastolic dysfunction.  Troponin peaked at 2301, now trending down.  Renal function seems improving and now at baseline.  Potassium at 3.1 which is being repleted.  Cardiology after discussing with family decided to proceed with medical management only.  CT of Chest/CXR: Lungs/Pleura: No focal consolidation.  OF NOTE: a Hiatal Hernia was noted on CT. Type of Study: Bedside Swallow Evaluation Previous Swallow Assessment: none Diet Prior to this Study:  Regular;Thin liquids Temperature Spikes Noted: No (wbc 13.3) Respiratory Status: Room air ([place] overnight) History of Recent Intubation: No Behavior/Cognition: Alert;Cooperative;Pleasant mood Oral Cavity Assessment: Within Functional Limits Oral Care Completed by SLP: Recent completion by staff Oral Cavity - Dentition: Adequate natural dentition Vision: Functional for self-feeding Self-Feeding Abilities: Able to feed self;Needs set up Patient Positioning: Upright in chair Baseline Vocal Quality: Normal Volitional Cough: Strong Volitional Swallow: Able to elicit    Oral/Motor/Sensory Function Overall Oral Motor/Sensory Function: Within functional limits   Ice Chips Ice chips: Not tested   Thin Liquid Thin Liquid: Within functional limits Presentation: Self Fed;Cup;Straw (several sips/trials)    Nectar Thick Nectar Thick Liquid: Not tested   Honey Thick Honey Thick Liquid: Not tested   Puree Puree: Within functional limits (w/ NSG/pills) Other Comments: no dificulty   Solid     Solid: Not tested Other Comments: pt had recently eaten        Orinda Kenner, [place], [place]; [place] [phone] (ascom) Ily Denno 08/11/2022,4:42 PM

## 2022-08-12 LAB — CBC
HCT: 30.8 % — ABNORMAL LOW (ref 36.0–46.0)
Hemoglobin: 10.4 g/dL — ABNORMAL LOW (ref 12.0–15.0)
MCH: 31.2 pg (ref 26.0–34.0)
MCHC: 33.8 g/dL (ref 30.0–36.0)
MCV: 92.5 fL (ref 80.0–100.0)
Platelets: 237 10*3/uL (ref 150–400)
RBC: 3.33 MIL/uL — ABNORMAL LOW (ref 3.87–5.11)
RDW: 12.1 % (ref 11.5–15.5)
WBC: 8.3 10*3/uL (ref 4.0–10.5)
nRBC: 0 % (ref 0.0–0.2)

## 2022-08-12 MED ORDER — LOSARTAN POTASSIUM 25 MG PO TABS: 12.5000 mg | ORAL_TABLET | Freq: Every day | ORAL | 2 refills | Status: DC

## 2022-08-12 MED ORDER — HYOSCYAMINE SULFATE 0.125 MG PO TBDP: 0.2500 mg | ORAL_TABLET | Freq: Four times a day (QID) | ORAL | 0 refills | Status: DC | PRN

## 2022-08-12 MED ORDER — METOPROLOL SUCCINATE ER 25 MG PO TB24: 12.5000 mg | ORAL_TABLET | Freq: Every day | ORAL | 1 refills | Status: DC

## 2022-08-12 MED ORDER — HYOSCYAMINE SULFATE 0.125 MG PO TBDP
0.2500 mg | ORAL_TABLET | Freq: Four times a day (QID) | ORAL | Status: DC | PRN
  Administered 2022-08-12: 0.25 mg via ORAL
  Filled 2022-08-12 (×2): qty 2

## 2022-08-12 MED ORDER — PANTOPRAZOLE SODIUM 40 MG PO TBEC: 40.0000 mg | DELAYED_RELEASE_TABLET | Freq: Two times a day (BID) | ORAL | 1 refills | Status: DC

## 2022-08-12 MED ORDER — FUROSEMIDE 20 MG PO TABS: 20.0000 mg | ORAL_TABLET | Freq: Every day | ORAL | 2 refills | Status: DC

## 2022-08-12 MED ORDER — FAMOTIDINE 20 MG PO TABS
20.0000 mg | ORAL_TABLET | Freq: Once | ORAL | Status: AC
Start: 2022-08-12 — End: 2022-08-12
  Administered 2022-08-12: 20 mg via ORAL
  Filled 2022-08-12: qty 1

## 2022-08-12 MED ORDER — NITROGLYCERIN 0.4 MG SL SUBL: 0.4000 mg | SUBLINGUAL_TABLET | SUBLINGUAL | 12 refills | Status: DC | PRN

## 2022-08-12 MED ORDER — ATORVASTATIN CALCIUM 40 MG PO TABS: 40.0000 mg | ORAL_TABLET | Freq: Every day | ORAL | 1 refills | Status: DC

## 2022-08-12 NOTE — Progress Notes (Signed)
Assisted pt to walk to bathroom and when getting back in bed, laid pt flat briefly to boost up in bed. Then raised head of bed and pt spitting up thick clear liquid- appears like saliva. Denies nausea, CP or abd pain. Says feels like she can't swallow so keeps spitting up. Sats 99% RA. Per daughter Juliann Pulse at bedside, this is what has been happening at home.   Pt continued spitting up clear liquid off and on for approx 30-40 minutes. Night NP notified and order for hycoscyamine and pepcid received. Symptoms mostly resolved by the time meds given but pt reports feeling worn out from episode. HOB remains elevated.

## 2022-08-12 NOTE — Discharge Summary (Signed)
Physician Discharge Summary   Patient: Julia Salazar MRN: [id number] DOB: 18-Dec-1928  Admit date:     08/09/2022  Discharge date: 08/12/22  Discharge Physician: Lorella Nimrod   PCP: Birdie Sons, MD   Recommendations at discharge:  Please obtain CBC and BMP in 1 week Follow-up with cardiology within a week With primary care provider  Discharge Diagnoses: Principal Problem:   NSTEMI (non-ST elevated myocardial infarction) [facility]) Active Problems:   Acute HFrEF (heart failure with reduced ejection fraction) (HCC)   Cough   Acute kidney injury superimposed on chronic kidney disease ([place])   Essential (primary) hypertension   History of ovarian cancer   Gastroesophageal reflux disease with esophagitis without hemorrhage   Hospital Course: Taken from H&P.  Julia Salazar is a 88 y.o. female with medical history significant for HTN, arthritis who presents to the ED with an incessant coughing subsequently followed by chest pain, worse with coughing and associated with shortness of breath.  Patient has had previous coughing spells over the past several months but not as protracted as the current one.  She has associated upper abdominal pain.  Denies fever or chills ED course and data review: BP on arrival 138/106 down to 105/67 at the time of admission.  Respiratory rate 22-30 with O2 sat in the high 90s.  Labs with troponin 2107>>2301.  WBC 11,900, creatinine 1.73 up from baseline of 1.23.  EKG, personally viewed and interpreted showing NSR at 85 with nonspecific ST-T wave changes CTA aorta shows the following: IMPRESSION: 1. At least small volume hiatal hernia. Given history, correlate with signs and symptoms of gastric reflux. 2. No acute intrapulmonary abnormality. 3. Mobile cecum residing within the rectouterine pouch dilated up to 8 cm with fluid. No associated bowel thickening, colonic fat stranding, perforation, or obstruction. Indeterminate etiology. Query incidental finding.    Patient started on a heparin infusion treated with morphine and nitroglycerin for pain.  Cardiology was consulted for concern of NSTEMI.  9/6: Cardiology ordered echocardiogram which is pending. They are recommending conservative management with continuation of heparin infusion for 48 hours along with statin, aspirin and as needed nitro. No plan for any intervention at this time. Patient continued to have some substernal chest pain going towards the neck with some associated shortness of breath.  9/7: Echocardiogram with EF of 30 to 35%, global hypokinesis and regional wall motion abnormalities involving left ventricular mid apical lateral wall, septal wall and apical region.  Also found to have grade 1 diastolic dysfunction. Troponin peaked at 2301, now trending down. Renal function seems improving and now at baseline. Potassium at 3.1 which is being repleted. Cardiology after discussing with family decided to proceed with medical management only.  They added low-dose losartan and rest of the medications will be added as an outpatient. They were recommending completion of 48 hours of heparin infusion which will be completed around 1 AM.  9/8: Patient completed 48 hours of heparin.  Remained chest pain-free at rest and with ambulation.  Had another bout of coughing spell which lasted about half an hour this morning, improved with hyoscyamine.  That coughing spell makes her very lethargic afterwards.  Appetite remained little poor.  Patient really wants to go home. Family does not want any aggressive measures or intervention at this time.  Patient is high risk for mortality based on her age, prior comorbidities and recent NSTEMI.  We ordered home health services to provide maximum support at home.  Patient lives alone at baseline  but family provide as needed assistance.  Cardiology made some changes to her medications, she is being discharged on aspirin, Lipitor, low-dose metoprolol, Lasix and  losartan and need to have a close follow-up with cardiologist for further management of her acute HFrEF secondary to NSTEMI.  Patient will continue on current medications as mentioned above and will follow-up with her providers for further recommendations.  Assessment and Plan: * NSTEMI (non-ST elevated myocardial infarction) [facility]) Cardiology is recommending conservative management, also ordered echocardiogram. -Continue heparin infusion-for 48 hours -Continue aspirin, atorvastatin nitroglycerin sublingual as needed chest pain Holding beta-blocker due to soft BP on arrival -No plan for any intervention at this time. -Trend troponin -Follow-up echocardiogram results  Acute HFrEF (heart failure with reduced ejection fraction) (HCC) Echocardiogram with EF of 30 to 35% with global hypokinesis and regional wall motion abnormalities.  Most likely secondary to NSTEMI. -Cardiology is recommending conservative management with beta-blocker, Lasix and also added low-dose losartan. -Close outpatient follow-up for further recommendations  Acute kidney injury superimposed on chronic kidney disease ([place]) Most likely prerenal.  Creatinine at 1.73>>1.23 with baseline around 1.2-1.3 which makes it CKD stage III A. Patient did receive contrast for CTA. AKI resolved. -Monitor renal function -Avoid nephrotoxins  Cough CTA aorta study showed no infiltrate but did show hiatal hernia Cough could be related to reflux, possible aspiration -PPI trial  Essential (primary) hypertension Blood pressure currently within goal. -Cardiology added low-dose losartan and we will discontinue home amlodipine. -Continue low-dose Lasix per cardiology  History of ovarian cancer No acute issues suspected. -Outpatient follow-up  Gastroesophageal reflux disease with esophagitis without hemorrhage - History of small hiatal hernia. -Current continue with PPI   Consultants: Cardiology Procedures performed:  None Disposition: Home health Diet recommendation:  Discharge Diet Orders (From admission, onward)     Start     Ordered   08/12/22 0000  Diet - low sodium heart healthy        08/12/22 1115           Cardiac diet DISCHARGE MEDICATION: Allergies as of 08/12/2022       Reactions   Lovastatin    Sertraline Hcl    Etodolac Rash        Medication List     STOP taking these medications    amLODipine 5 MG tablet Commonly known as: NORVASC   amoxicillin-clavulanate 875-125 MG tablet Commonly known as: AUGMENTIN   candesartan 8 MG tablet Commonly known as: ATACAND   cholestyramine light 4 GM/DOSE powder Commonly known as: Prevalite   hydrochlorothiazide 12.5 MG tablet Commonly known as: HYDRODIURIL   pravastatin 40 MG tablet Commonly known as: PRAVACHOL       TAKE these medications    aspirin 81 MG tablet Take 81 mg by mouth daily.   atorvastatin 40 MG tablet Commonly known as: LIPITOR Take 1 tablet (40 mg total) by mouth daily.   Calcium Carb-Cholecalciferol 600-200 MG-UNIT Tabs Take 1 tablet by mouth daily.   carisoprodol 350 MG tablet Commonly known as: SOMA Take 1 tablet (350 mg total) by mouth every 6 (six) hours as needed for muscle spasms.   Combigan 0.2-0.5 % ophthalmic solution Generic drug: brimonidine-timolol Place 1 drop into both eyes every 12 (twelve) hours.   diclofenac Sodium 1 % Gel Commonly known as: VOLTAREN Apply topically 4 (four) times daily.   diphenhydrAMINE 25 mg capsule Commonly known as: BENADRYL Take 25 mg by mouth at bedtime.   diphenoxylate-atropine 2.5-0.025 MG tablet Commonly known as: LOMOTIL Take 1  tablet by mouth 4 (four) times daily as needed.   fentaNYL 50 MCG/HR Commonly known as: [place] 1 patch onto the skin every 3 (three) days.   FLUoxetine 20 MG capsule Commonly known as: PROZAC TAKE 1 CAPSULE BY MOUTH EVERY DAY   furosemide 20 MG tablet Commonly known as: LASIX Take 1 tablet (20 mg  total) by mouth daily. What changed:  medication strength See the new instructions.   HYDROcodone-acetaminophen 5-325 MG tablet Commonly known as: NORCO/VICODIN Take 1 tablet by mouth 3 (three) times daily as needed for moderate pain. What changed: Another medication with the same name was removed. Continue taking this medication, and follow the directions you see here.   hyoscyamine 0.125 MG Tbdp disintergrating tablet Commonly known as: ANASPAZ Take 2 tablets (0.25 mg total) by mouth every 6 (six) hours as needed (excess oral secretions).   losartan 25 MG tablet Commonly known as: COZAAR Take 0.5 tablets (12.5 mg total) by mouth daily.   meloxicam 15 MG tablet Commonly known as: MOBIC TAKE 1 TABLET BY MOUTH DAILY AS NEEDED.   metoprolol succinate 25 MG 24 hr tablet Commonly known as: TOPROL-XL Take 0.5 tablets (12.5 mg total) by mouth daily.   MULTIVITAMIN ADULT PO Take 1 tablet by mouth daily.   nitroGLYCERIN 0.4 MG SL tablet Commonly known as: NITROSTAT Place 1 tablet (0.4 mg total) under the tongue every 5 (five) minutes x 3 doses as needed for chest pain.   omega-3 acid ethyl esters 1 g capsule Commonly known as: LOVAZA TAKE 4 CAPSULES DAILY   oxybutynin 5 MG 24 hr tablet Commonly known as: DITROPAN-XL TAKE 1 TABLET BY MOUTH EVERYDAY AT BEDTIME   pantoprazole 40 MG tablet Commonly known as: PROTONIX Take 1 tablet (40 mg total) by mouth 2 (two) times daily before a meal.   prednisoLONE acetate 1 % ophthalmic suspension Commonly known as: PRED FORTE Place 1 drop into the left eye daily.        Discharge Exam: Filed Weights   08/09/22 2148  Weight: 48.1 kg   General.  Frail elderly lady, in no acute distress. Pulmonary.  Lungs clear bilaterally, normal respiratory effort. CV.  Regular rate and rhythm, no JVD, rub or murmur. Abdomen.  Soft, nontender, nondistended, BS positive. CNS.  Alert and oriented .  No focal neurologic deficit. Extremities.  No  edema, no cyanosis, pulses intact and symmetrical. Psychiatry.  Judgment and insight appears normal.   Condition at discharge: stable  The results of significant diagnostics from this hospitalization (including imaging, microbiology, ancillary and laboratory) are listed below for reference.   Imaging Studies: ECHOCARDIOGRAM COMPLETE  Result Date: 08/10/2022    ECHOCARDIOGRAM REPORT   Patient Name:   Julia Salazar Date of Exam: 08/10/2022 Medical Rec #:  [id number]     Height:       59.0 in Accession #:    [id number]    Weight:       106.0 lb Date of Birth:  [birth year]     BSA:          1.408 m Patient Age:    62 years      BP:           103/67 mmHg Patient Gender: F             HR:           76 bpm. Exam Location:  [facility] Procedure: 2D Echo, Color Doppler, Cardiac Doppler and Strain Analysis Indications:  I21.4 NSTEMI  History:         Patient has no prior history of Echocardiogram examinations.                  Signs/Symptoms:Chest Pain and Shortness of Breath; Risk                  Factors:Hypertension and Dyslipidemia.  Sonographer:     Charmayne Sheer Referring Phys:  [id number] SHERI HAMMOCK Diagnosing Phys: Kate Sable MD  Sonographer Comments: Global longitudinal strain was attempted. IMPRESSIONS  1. Left ventricular ejection fraction, by estimation, is 30 to 35%. The left ventricle has moderate to severely decreased function. The left ventricle demonstrates regional wall motion abnormalities (see scoring diagram/findings for description). Left ventricular diastolic parameters are consistent with Grade I diastolic dysfunction (impaired relaxation). There is severe hypokinesis of the left ventricular, mid-apical lateral wall, septal wall and apical segment.  2. Right ventricular systolic function is normal. The right ventricular size is normal. Mildly increased right ventricular wall thickness.  3. The mitral valve is degenerative. Mild to moderate mitral valve regurgitation.  4. The aortic valve was not  well visualized. Aortic valve regurgitation is trivial. Aortic valve sclerosis/calcification is present, without any evidence of aortic stenosis.  5. The inferior vena cava is normal in size with greater than 50% respiratory variability, suggesting right atrial pressure of 3 mmHg. FINDINGS  Left Ventricle: Left ventricular ejection fraction, by estimation, is 30 to 35%. The left ventricle has moderate to severely decreased function. The left ventricle demonstrates regional wall motion abnormalities. Severe hypokinesis of the left ventricular, mid-apical lateral wall, septal wall and apical segment. The left ventricular internal cavity size was normal in size. There is no left ventricular hypertrophy. Left ventricular diastolic parameters are consistent with Grade I diastolic dysfunction (impaired relaxation). Right Ventricle: The right ventricular size is normal. Mildly increased right ventricular wall thickness. Right ventricular systolic function is normal. Left Atrium: Left atrial size was normal in size. Right Atrium: Right atrial size was normal in size. Pericardium: There is no evidence of pericardial effusion. Mitral Valve: The mitral valve is degenerative in appearance. Mild to moderate mitral annular calcification. Mild to moderate mitral valve regurgitation. Tricuspid Valve: The tricuspid valve is grossly normal. Tricuspid valve regurgitation is mild. Aortic Valve: The aortic valve was not well visualized. Aortic valve regurgitation is trivial. Aortic valve sclerosis/calcification is present, without any evidence of aortic stenosis. Aortic valve mean gradient measures 4.0 mmHg. Aortic valve peak gradient measures 6.7 mmHg. Aortic valve area, by VTI measures 2.18 cm. Pulmonic Valve: The pulmonic valve was grossly normal. Pulmonic valve regurgitation is mild. Aorta: The aortic root and ascending aorta are structurally normal, with no evidence of dilitation. Venous: The inferior vena cava is normal in size  with greater than 50% respiratory variability, suggesting right atrial pressure of 3 mmHg. IAS/Shunts: No atrial level shunt detected by color flow Doppler.  LEFT VENTRICLE PLAX 2D LVIDd:         3.39 cm   Diastology LVIDs:         2.29 cm   LV e' medial:    4.35 cm/s LV PW:         1.11 cm   LV E/e' medial:  19.3 LV IVS:        1.00 cm   LV e' lateral:   4.35 cm/s LVOT diam:     2.00 cm   LV E/e' lateral: 19.3 LV SV:  62 LV SV Index:   44 LVOT Area:     3.14 cm  RIGHT VENTRICLE RV Basal diam:  2.63 cm LEFT ATRIUM             Index        RIGHT ATRIUM          Index LA diam:        2.90 cm 2.06 cm/m   RA Area:     6.80 cm LA Vol (A2C):   38.2 ml 27.13 ml/m  RA Volume:   9.45 ml  6.71 ml/m LA Vol (A4C):   54.0 ml 38.35 ml/m LA Biplane Vol: 46.4 ml 32.95 ml/m  AORTIC VALVE                     PULMONIC VALVE AV Area (Vmax):    2.37 cm      PV Vmax:       0.88 m/s AV Area (Vmean):   2.03 cm      PV Peak grad:  3.1 mmHg AV Area (VTI):     2.18 cm AV Vmax:           129.00 cm/s AV Vmean:          100.000 cm/s AV VTI:            0.283 m AV Peak Grad:      6.7 mmHg AV Mean Grad:      4.0 mmHg LVOT Vmax:         97.30 cm/s LVOT Vmean:        64.600 cm/s LVOT VTI:          0.196 m LVOT/AV VTI ratio: 0.69  AORTA Ao Root diam: 2.70 cm MITRAL VALVE MV Area (PHT): 5.50 cm     SHUNTS MV Decel Time: 138 msec     Systemic VTI:  0.20 m MV E velocity: 84.00 cm/s   Systemic Diam: 2.00 cm MV A velocity: 107.00 cm/s MV E/A ratio:  0.79 Kate Sable MD Electronically signed by Kate Sable MD Signature Date/Time: 08/10/2022/3:32:10 PM    Final    CT Angio Chest/Abd/Pel for Dissection W and/or Wo Contrast  Result Date: 08/10/2022 CLINICAL DATA:  Chest pain or back pain, aortic dissection suspected. Pt has been having chest pain today, has been having a cough for several weeks and has coughing episodes, but none that have ever lasted all day, pt pt has not even been able to drink liquids, pt just continues to  cough EXAM: CT ANGIOGRAPHY CHEST, ABDOMEN AND PELVIS TECHNIQUE: Non-contrast CT of the chest was initially obtained. Multidetector CT imaging through the chest, abdomen and pelvis was performed using the standard protocol during bolus administration of intravenous contrast. Multiplanar reconstructed images and MIPs were obtained and reviewed to evaluate the vascular anatomy. RADIATION DOSE REDUCTION: This exam was performed according to the departmental dose-optimization program which includes automated exposure control, adjustment of the mA and/or kV according to patient size and/or use of iterative reconstruction technique. CONTRAST:  20m OMNIPAQUE IOHEXOL 350 MG/ML SOLN COMPARISON:  CT abdomen pelvis 01/07/2005 FINDINGS: CTA CHEST FINDINGS Cardiovascular: Preferential opacification of the thoracic aorta. No evidence of thoracic aortic aneurysm or dissection. Normal heart size. No significant pericardial effusion. Severe atherosclerotic plaque of the thoracic aorta. At least 3 vessel coronary artery calcifications. The main pulmonary artery is normal in caliber. No central pulmonary embolus. Unable to evaluate more distally due to timing of contrast. Mediastinum/Nodes: No enlarged mediastinal, hilar,  or axillary lymph nodes. Thyroid gland, trachea, and esophagus demonstrate no significant findings. Interval increase in size of a small volume hiatal hernia. Lungs/Pleura: No focal consolidation. Left apical pulmonary micronodule. No follow-up needed if patient is low-risk.This recommendation follows the consensus statement: Guidelines for Management of Incidental Pulmonary Nodules Detected on CT Images: From the Fleischner Society 2017; Radiology 2017; 284:228-243. No pulmonary mass. No pleural effusion. No pneumothorax. Musculoskeletal: No chest wall abnormality. No suspicious lytic or blastic osseous lesions. No acute displaced fracture. Multilevel degenerative changes of the spine. Review of the MIP images  confirms the above findings. CTA ABDOMEN AND PELVIS FINDINGS VASCULAR Aorta: Atherosclerotic plaque. Normal caliber aorta without aneurysm, dissection, vasculitis or significant stenosis. Celiac: Atherosclerotic plaque of its origin. Patent without evidence of aneurysm, dissection, vasculitis or significant stenosis. SMA: Atherosclerotic plaque of its origin. Replaced common hepatic artery off of the superior mesenteric artery. Patent without evidence of aneurysm, dissection, vasculitis or significant stenosis. Renals: Atherosclerotic plaque of the origin. Both renal arteries are patent without evidence of aneurysm, dissection, vasculitis, fibromuscular dysplasia or significant stenosis. IMA: Patent without evidence of aneurysm, dissection, vasculitis or significant stenosis. Inflow: Mild atherosclerotic plaque. Patent without evidence of aneurysm, dissection, vasculitis or significant stenosis. Veins: No obvious venous abnormality within the limitations of this arterial phase study. Review of the MIP images confirms the above findings. NON-VASCULAR Hepatobiliary: The liver is borderline enlarged up to 18 cm. No focal liver abnormality. Status post cholecystectomy. No biliary dilatation. Pancreas: No focal lesion. Normal pancreatic contour. No surrounding inflammatory changes. No main pancreatic ductal dilatation. Spleen: Normal in size without focal abnormality. Adrenals/Urinary Tract: Hyperplastic left adrenal gland.  No adrenal nodule bilaterally. Bilateral kidneys enhance symmetrically. No hydronephrosis. No hydroureter. The urinary bladder is unremarkable. Stomach/Bowel: Stomach is within normal limits. No evidence of small bowel wall thickening or dilatation. Mobile cecum residing within the rectouterine pouch noted to be dilated up to 8 cm with fluid. No associated fat stranding or bowel wall thickening. No pneumatosis. Appendix appears normal. Lymphatic: No lymphadenopathy. Reproductive: Status post  hysterectomy. No adnexal masses. Other: Similar multiple surgical clips noted within the abdomen. No intraperitoneal free fluid. No intraperitoneal free gas. No organized fluid collection. Musculoskeletal: No abdominal wall hernia or abnormality. No suspicious lytic or blastic osseous lesions. No acute displaced fracture. Severe degenerative changes right hip. Grade 2 anterolisthesis of L5 on S1 in the setting of bilateral L5 pars interarticularis defects status post L5-S1 posterolateral and interbody fusion. Review of the MIP images confirms the above findings. IMPRESSION: 1. At least small volume hiatal hernia. Given history, correlate with signs and symptoms of gastric reflux. 2. No acute intrapulmonary abnormality. 3. Mobile cecum residing within the rectouterine pouch dilated up to 8 cm with fluid. No associated bowel thickening, colonic fat stranding, perforation, or obstruction. Indeterminate etiology. Query incidental finding. Electronically Signed   By: Iven Finn M.D.   On: 08/10/2022 00:53   DG Chest Portable 1 View  Result Date: 08/09/2022 CLINICAL DATA:  Increasing shortness of breath. EXAM: PORTABLE CHEST 1 VIEW COMPARISON:  PA and lateral earlier today at 1:59 p.m. FINDINGS: 11:30 p.m. The cardiomediastinal silhouette is within normal limits. There is calcification in the aortic arch. No vascular congestion is seen. The lungs are clear. The sulci are sharp. Osteopenia and degenerative change thoracic spine with lumbar dextroscoliosis partially visible. IMPRESSION: No evidence of acute chest process or interval changes. Electronically Signed   By: Telford Nab M.D.   On: 08/09/2022 23:54   DG  Chest 2 View  Result Date: 08/09/2022 CLINICAL DATA:  Cough for 3 weeks EXAM: CHEST - 2 VIEW COMPARISON:  None Available. FINDINGS: The heart size and mediastinal contours are within normal limits. Aortic atherosclerosis. Both lungs are clear. Postsurgical changes of the right humeral head. Scoliosis  of the spine. IMPRESSION: No active cardiopulmonary disease. Electronically Signed   By: Donavan Foil M.D.   On: 08/09/2022 21:50    Microbiology: No results found for this or any previous visit.  Labs: CBC: Recent Labs  Lab 08/09/22 2155 08/11/22 0334 08/12/22 0529  WBC 11.9* 13.3* 8.3  HGB 13.3 11.5* 10.4*  HCT 38.7 34.1* 30.8*  MCV 90.4 91.4 92.5  PLT 351 299 793   Basic Metabolic Panel: Recent Labs  Lab 08/09/22 2155 08/11/22 0334  NA 135 138  K 3.4* 3.1*  CL 99 106  CO2 21* 23  GLUCOSE 136* 114*  BUN 62* 39*  CREATININE 1.73* 1.23*  CALCIUM 10.1 9.0  MG  --  2.0   Liver Function Tests: No results for input(s): "AST", "ALT", "ALKPHOS", "BILITOT", "PROT", "ALBUMIN" in the last 168 hours. CBG: No results for input(s): "GLUCAP" in the last 168 hours.  Discharge time spent: greater than 30 minutes.  This record has been created using Systems analyst. Errors have been sought and corrected,but may not always be located. Such creation errors do not reflect on the standard of care.   Signed: Lorella Nimrod, MD [facility] 08/12/2022

## 2022-08-12 NOTE — TOC Transition Note (Signed)
Transition of Care [facility]) - CM/SW Discharge Note   Patient Details  Name: LARCENIA HOLADAY MRN: [id number] Date of Birth: [birth year]  [facility] CM/SW Contact:  Alberteen Sam, LCSW Phone Number: 08/12/2022, 11:16 AM   Clinical Narrative:     CSW spoke with patient's daughter Juliann Pulse regarding [facility] services, in agreement with no preference of agency. Referral given to cheryl with [facility]. CSW notes per Juliann Pulse, they have all DME at home and Juliann Pulse will transport patient home at dc today. [facility] informed to call Juliann Pulse to set up [facility] services.   No further dc needs, TOC signing off.    Final next level of care: [place] Barriers to Discharge: No Barriers Identified   Patient Goals and CMS Choice Patient states their goals for this hospitalization and ongoing recovery are:: to go home CMS Medicare.gov Compare Post Acute Care list provided to:: Patient Represenative (must comment) (daughter Juliann Pulse) Choice offered to / list presented to : Adult Children  Discharge Placement                       Discharge Plan and Services                          HH Arranged: PT, OT, RN, Nurse's Aide, Social Work [employer] Agency: [place] Date [facility] Agency Contacted: 08/12/22 Time [place]: 71 Representative spoke with at [place]: Malachy Mood  Social Determinants of Health ([place]) Interventions     Readmission Risk Interventions     No data to display

## 2022-08-12 NOTE — Progress Notes (Addendum)
Rounding Note    Patient Name: Julia Salazar Date of Encounter: 08/12/2022  [place] Cardiologist: New- Consult done by Dr. Garen Lah  Subjective   Patient seen on AM rounds. Denies any chest pain or shortness of breath. Had a coughing spell this morning that lasted for around 40 mins of her coughing up clear thick sputum. Given PRN hyoscyamine. After medications she was able to rest and the coughing subsided.   Inpatient Medications    Scheduled Meds:  aspirin EC  81 mg Oral Daily   atorvastatin  40 mg Oral Daily   furosemide  20 mg Oral Daily   losartan  12.5 mg Oral Daily   metoprolol succinate  12.5 mg Oral Daily   pantoprazole  40 mg Oral BID AC   Continuous Infusions:  PRN Meds: acetaminophen, hyoscyamine, LORazepam, morphine injection, nitroGLYCERIN, ondansetron (ZOFRAN) IV, mouth rinse   Vital Signs    Vitals:   08/11/22 1957 08/12/22 0012 08/12/22 0355 08/12/22 0752  BP: 96/69 116/80 123/82 118/66  Pulse: 95 74 84 79  Resp: '19 18 16 17  '$ Temp:  98.4 F (36.9 C) 98 F (36.7 C) 98 F (36.7 C)  TempSrc:  Oral Oral   SpO2: 94% 91% 94% 97%  Weight:      Height:        Intake/Output Summary (Last 24 hours) at 08/12/2022 0803 Last data filed at 08/11/2022 1852 Gross per 24 hour  Intake 1134 ml  Output --  Net 1134 ml      08/09/2022    9:48 PM 08/09/2022    1:20 PM 07/11/2022    9:55 AM  Last 3 Weights  Weight (lbs) 106 lb 106 lb 110 lb  Weight (kg) 48.081 kg 48.081 kg 49.896 kg      Telemetry    Sinus, lbbb, PAC's, rates 70-80 - Personally Reviewed  ECG    No new tracings - Personally Reviewed  Physical Exam   GEN: No acute distress.  Neck: No JVD Cardiac: RRR, no murmurs, rubs, or gallops.  Respiratory: Clear to auscultation bilaterally. Respirations are unlabored at rest GI: Soft, nontender, non-distended  MS: No edema; No deformity. Neuro:  Nonfocal  Psych: Normal affect   Labs    High Sensitivity Troponin:   Recent Labs   Lab 08/09/22 2155 08/09/22 2329 08/11/22 0334  TROPONINIHS 2,107* 2,301* 2,218*     Chemistry Recent Labs  Lab 08/09/22 2155 08/11/22 0334  NA 135 138  K 3.4* 3.1*  CL 99 106  CO2 21* 23  GLUCOSE 136* 114*  BUN 62* 39*  CREATININE 1.73* 1.23*  CALCIUM 10.1 9.0  MG  --  2.0  GFRNONAA 28* 41*  ANIONGAP 15 9    Lipids No results for input(s): "CHOL", "TRIG", "HDL", "LABVLDL", "LDLCALC", "CHOLHDL" in the last 168 hours.  Hematology Recent Labs  Lab 08/09/22 2155 08/11/22 0334 08/12/22 0529  WBC 11.9* 13.3* 8.3  RBC 4.28 3.73* 3.33*  HGB 13.3 11.5* 10.4*  HCT 38.7 34.1* 30.8*  MCV 90.4 91.4 92.5  MCH 31.1 30.8 31.2  MCHC 34.4 33.7 33.8  RDW 11.7 12.0 12.1  PLT 351 299 237   Thyroid No results for input(s): "TSH", "FREET4" in the last 168 hours.  BNPNo results for input(s): "BNP", "PROBNP" in the last 168 hours.  DDimer No results for input(s): "DDIMER" in the last 168 hours.   Radiology     Cardiac Studies  TTE 08/10/22 1. Left ventricular ejection fraction, by estimation,  is 30 to 35%. The  left ventricle has moderate to severely decreased function. The left  ventricle demonstrates regional wall motion abnormalities (see scoring  diagram/findings for description). Left  ventricular diastolic parameters are consistent with Grade I diastolic  dysfunction (impaired relaxation). There is severe hypokinesis of the left  ventricular, mid-apical lateral wall, septal wall and apical segment.   2. Right ventricular systolic function is normal. The right ventricular  size is normal. Mildly increased right ventricular wall thickness.   3. The mitral valve is degenerative. Mild to moderate mitral valve  regurgitation.   4. The aortic valve was not well visualized. Aortic valve regurgitation  is trivial. Aortic valve sclerosis/calcification is present, without any  evidence of aortic stenosis.   5. The inferior vena cava is normal in size with greater than 50%   respiratory variability, suggesting right atrial pressure of 3 mmHg.   Patient Profile     88 y.o. female with a history of hypertension, hyperlipidemia, CKD, who presented with a 2-week history of cough  Assessment & Plan    Chronic cough -currently suppressed after antitussives -Continue pantoprazole  -continue prn hyoscyamine on discharge -CT reveals small hiatal hernia   Elevated Hs troponins/ NSTEMI/ coronary calcifications on chest CT -currently chest pain free -Hs troponins trended 2107, 2301, 2218 -infused heparin drip x 48 hours -continue atorvastatin and asa -patient and family decline any further ischemic work-up -Will readdress cardiac rehab at hospital follow-up and re-evaluate functional status and capability  -Due to age, anemia and chronic kidney disease, she is at high risk for bleeding complication and thus I do not recommend a P2 Y12 inhibitor.  The risks outweigh the benefits.    Cardiomyopathy EF 30-35% likely ischemic -continue toprol xl 12.5 mg daily -continue lasix 20 mg daily -continue losartan 12.5 mg daily -daily bmp -patient and family decline ischemic work-up   For questions or updates, please contact [place] Please consult [email or web address] for contact info under        Signed, SHERI HAMMOCK, NP  08/12/2022, 8:03 AM

## 2022-08-15 ENCOUNTER — Telehealth: Payer: Self-pay

## 2022-08-15 NOTE — Telephone Encounter (Signed)
Transition Care Management Follow-up Telephone Call Date of discharge and from where: TCM DC [facility] 08-12-22 Dx: NSTEMI How have you been since you were released from the hospital? Doing much better  Any questions or concerns? No  Items Reviewed: Did the pt receive and understand the discharge instructions provided? Yes  Medications obtained and verified? Yes  Other? No  Any new allergies since your discharge? No  Dietary orders reviewed? Yes Do you have support at home? Yes   Home Care and Equipment/Supplies: Were home health services ordered? no If so, what is the name of the agency? na  Has the agency set up a time to come to the patient's home? not applicable Were any new equipment or medical supplies ordered?  No What is the name of the medical supply agency? na Were you able to get the supplies/equipment? not applicable Do you have any questions related to the use of the equipment or supplies? No  Functional Questionnaire: (I = Independent and D = Dependent) ADLs: I- WITH ASSISTACE  Bathing/Dressing- I- WITH ASSISTANCE   Meal Prep- I  Eating- I  Maintaining continence- I  Transferring/Ambulation- I- WALKER  Managing Meds- D  Follow up appointments reviewed:  PCP Hospital f/u appt confirmed? Yes  Scheduled to see Dr Caryn Section on 08-19-22 @ [facility] f/u appt confirmed? Yes  Scheduled to see Dr Ellyn Hack  on 09-15-22 @ 10am. Are transportation arrangements needed? No  If their condition worsens, is the pt aware to call PCP or go to the Emergency Dept.? Yes Was the patient provided with contact information for the PCP's office or ED? Yes Was to pt encouraged to call back with questions or concerns? Yes

## 2022-08-16 ENCOUNTER — Telehealth: Payer: Self-pay

## 2022-08-16 NOTE — Telephone Encounter (Unsigned)
Copied from [place] [id number]. Topic: General - Other >> Aug 16, 2022 12:26 PM Ja-Kwan M wrote: Reason for CRM: Jeanine Luz with [facility] reports that patient declined home health care services: nursing, PT, and OT. Cb# [phone]

## 2022-08-16 NOTE — Telephone Encounter (Signed)
Spoke with patient's daughter. She stated that the family is on board with her declining home health, and someone is always with her at home and helping her so there is not a need for it.

## 2022-08-19 ENCOUNTER — Ambulatory Visit (INDEPENDENT_AMBULATORY_CARE_PROVIDER_SITE_OTHER): Payer: Medicare Other | Admitting: Family Medicine

## 2022-08-19 ENCOUNTER — Encounter: Payer: Self-pay | Admitting: Family Medicine

## 2022-08-19 ENCOUNTER — Inpatient Hospital Stay: Payer: Medicare Other | Admitting: Physician Assistant

## 2022-08-19 VITALS — BP 114/76 | HR 96 | Wt 111.0 lb

## 2022-08-19 DIAGNOSIS — E781 Pure hyperglyceridemia: Secondary | ICD-10-CM

## 2022-08-19 DIAGNOSIS — K2100 Gastro-esophageal reflux disease with esophagitis, without bleeding: Secondary | ICD-10-CM

## 2022-08-19 DIAGNOSIS — F32A Depression, unspecified: Secondary | ICD-10-CM | POA: Diagnosis not present

## 2022-08-19 DIAGNOSIS — I214 Non-ST elevation (NSTEMI) myocardial infarction: Secondary | ICD-10-CM | POA: Diagnosis not present

## 2022-08-19 DIAGNOSIS — I10 Essential (primary) hypertension: Secondary | ICD-10-CM

## 2022-08-19 DIAGNOSIS — Z23 Encounter for immunization: Secondary | ICD-10-CM | POA: Diagnosis not present

## 2022-08-19 MED ORDER — FLUOXETINE HCL 20 MG PO CAPS: 40.0000 mg | ORAL_CAPSULE | Freq: Every day | ORAL | Status: DC

## 2022-08-19 NOTE — Progress Notes (Unsigned)
I,Tiffany J Bragg,acting as a scribe for Lelon Huh, MD.,have documented all relevant documentation on the behalf of Lelon Huh, MD,as directed by  Lelon Huh, MD while in the presence of Lelon Huh, MD.  Established patient visit   Patient: Julia Salazar   DOB: 07-16-1929   88 y.o. Female  MRN: [id number] Visit Date: 08/19/2022  Today's healthcare provider: Lelon Huh, MD   Chief Complaint  Patient presents with   Hospitalization Follow-up   Subjective    HPI  Follow up Hospitalization  Patient was admitted to [facility] on 08/09/22 and discharged on 08/12/22. She was treated for NSTEMI. Treatment for this included echocardiogram, 48 hours on heparin.  D/C instructions as for CBC and BMP at f/u appt. Telephone follow up was done on 08/15/22 She reports excellent compliance with treatment. She reports this condition is improved. She is scheduled to follow up with Dr. Ellyn Hack on 09-15-2022  She was started on atorvastatin '40mg'$  which she is tolerating well. She has been on Lovaza for several years for triglycerides but states she has been having trouble swallowing it.  ----------------------------------------------------------------------------------------- -   Medications: Outpatient Medications Prior to Visit  Medication Sig   aspirin 81 MG tablet Take 81 mg by mouth daily.   atorvastatin (LIPITOR) 40 MG tablet Take 1 tablet (40 mg total) by mouth daily.   brimonidine-timolol (COMBIGAN) 0.2-0.5 % ophthalmic solution Place 1 drop into both eyes every 12 (twelve) hours.   Calcium Carb-Cholecalciferol 600-200 MG-UNIT TABS Take 1 tablet by mouth daily.   carisoprodol (SOMA) 350 MG tablet Take 1 tablet (350 mg total) by mouth every 6 (six) hours as needed for muscle spasms.   diclofenac Sodium (VOLTAREN) 1 % GEL Apply topically 4 (four) times daily.   diphenhydrAMINE (BENADRYL) 25 mg capsule Take 25 mg by mouth at bedtime.    diphenoxylate-atropine (LOMOTIL) 2.5-0.025  MG tablet Take 1 tablet by mouth 4 (four) times daily as needed.   fentaNYL (DURAGESIC) 50 MCG/HR Place 1 patch onto the skin every 3 (three) days.   FLUoxetine (PROZAC) 20 MG capsule TAKE 1 CAPSULE BY MOUTH EVERY DAY   furosemide (LASIX) 20 MG tablet Take 1 tablet (20 mg total) by mouth daily.   HYDROcodone-acetaminophen (NORCO/VICODIN) 5-325 MG tablet Take 1 tablet by mouth 3 (three) times daily as needed for moderate pain.   hyoscyamine (ANASPAZ) 0.125 MG TBDP disintergrating tablet Take 2 tablets (0.25 mg total) by mouth every 6 (six) hours as needed (excess oral secretions).   losartan (COZAAR) 25 MG tablet Take 0.5 tablets (12.5 mg total) by mouth daily.   meloxicam (MOBIC) 15 MG tablet TAKE 1 TABLET BY MOUTH DAILY AS NEEDED.   metoprolol succinate (TOPROL-XL) 25 MG 24 hr tablet Take 0.5 tablets (12.5 mg total) by mouth daily.   Multiple Vitamins-Minerals (MULTIVITAMIN ADULT PO) Take 1 tablet by mouth daily.   nitroGLYCERIN (NITROSTAT) 0.4 MG SL tablet Place 1 tablet (0.4 mg total) under the tongue every 5 (five) minutes x 3 doses as needed for chest pain.   omega-3 acid ethyl esters (LOVAZA) 1 g capsule TAKE 4 CAPSULES DAILY   oxybutynin (DITROPAN-XL) 5 MG 24 hr tablet TAKE 1 TABLET BY MOUTH EVERYDAY AT BEDTIME   pantoprazole (PROTONIX) 40 MG tablet Take 1 tablet (40 mg total) by mouth 2 (two) times daily before a meal.   prednisoLONE acetate (PRED FORTE) 1 % ophthalmic suspension Place 1 drop into the left eye daily.    No facility-administered medications prior  to visit.    Review of Systems  Constitutional:  Negative for appetite change, chills, fatigue and fever.  Respiratory:  Negative for chest tightness and shortness of breath.   Cardiovascular:  Negative for chest pain and palpitations.  Gastrointestinal:  Negative for abdominal pain, nausea and vomiting.  Neurological:  Negative for dizziness and weakness.    {Labs  Heme  Chem  Endocrine  Serology  Results Review  (optional):23779}   Objective    BP 114/76 (BP Location: Right Arm, Patient Position: Sitting, Cuff Size: Normal)   Pulse 96   Wt 111 lb (50.3 kg)   BMI 22.42 kg/m  {Show previous vital signs (optional):23777}  Physical Exam    General: Appearance:    Well developed, well nourished female in no acute distress  Eyes:    PERRL, conjunctiva/corneas clear, EOM's intact       Lungs:     Clear to auscultation bilaterally, respirations unlabored  Heart:    Normal heart rate. Normal rhythm. No murmurs, rubs, or gallops.    MS:   All extremities are intact.    Neurologic:   Awake, alert, oriented x 3. No apparent focal neurological defect.         Assessment & Plan     1. NSTEMI (non-ST elevated myocardial infarction) [facility]) Doing well with no chest pains or dyspnea since hospital discharge.  Continue current medications.   Follow up cardiology as scheduled.   2. Hypertriglyceridemia Is having trouble swallow Lovaza, but was recently started on atorvastatin. Will d/c Lovaza for the time being and recheck lipids at follow up in November.   3. Gastroesophageal reflux disease with esophagitis without hemorrhage Secondary to enlarging hiatal hernia seen on recent CT. Primary sx was persistent cough which has resolved since starting pantoprazole.   4. Depression, unspecified depression type Her daughter feels she is a little more anxious with recent hospitalization. Will increase fluoxetine to 2 x '20mg'$  every day. Can change to '40mg'$  tablet with next refill if doing better.   5. Essential (primary) hypertension Well controlled, off hctz with other medication changed from recently hospitalization.  - CBC - Comprehensive metabolic panel  6. Need for influenza vaccination  - Flu Vaccine QUAD High Dose IM (Fluad) .       The entirety of the information documented in the History of Present Illness, Review of Systems and Physical Exam were personally obtained by me. Portions of this  information were initially documented by the CMA and reviewed by me for thoroughness and accuracy.     Lelon Huh, MD  [facility] [phone] (phone) [phone] (fax)  [place]

## 2022-08-24 LAB — COMPREHENSIVE METABOLIC PANEL
ALT: 7 IU/L (ref 0–32)
AST: 16 IU/L (ref 0–40)
Albumin/Globulin Ratio: 1.1 — ABNORMAL LOW (ref 1.2–2.2)
Albumin: 3.1 g/dL — ABNORMAL LOW (ref 3.6–4.6)
Alkaline Phosphatase: 77 IU/L (ref 44–121)
BUN/Creatinine Ratio: 16 (ref 12–28)
BUN: 17 mg/dL (ref 10–36)
Bilirubin Total: 0.3 mg/dL (ref 0.0–1.2)
CO2: 21 mmol/L (ref 20–29)
Calcium: 8.7 mg/dL (ref 8.7–10.3)
Chloride: 103 mmol/L (ref 96–106)
Creatinine, Ser: 1.04 mg/dL — ABNORMAL HIGH (ref 0.57–1.00)
Globulin, Total: 2.9 g/dL (ref 1.5–4.5)
Glucose: 100 mg/dL — ABNORMAL HIGH (ref 70–99)
Potassium: 4.1 mmol/L (ref 3.5–5.2)
Sodium: 140 mmol/L (ref 134–144)
Total Protein: 6 g/dL (ref 6.0–8.5)
eGFR: 51 mL/min/{1.73_m2} — ABNORMAL LOW (ref >59)

## 2022-08-24 LAB — CBC
Hematocrit: 32.4 % — ABNORMAL LOW (ref 34.0–46.6)
Hemoglobin: 10.8 g/dL — ABNORMAL LOW (ref 11.1–15.9)
MCH: 31.1 pg (ref 26.6–33.0)
MCHC: 33.3 g/dL (ref 31.5–35.7)
MCV: 93 fL (ref 79–97)
Platelets: 289 10*3/uL (ref 150–450)
RBC: 3.47 x10E6/uL — ABNORMAL LOW (ref 3.77–5.28)
RDW: 12.2 % (ref 11.7–15.4)
WBC: 8 10*3/uL (ref 3.4–10.8)

## 2022-08-29 ENCOUNTER — Other Ambulatory Visit: Payer: Self-pay | Admitting: Family Medicine

## 2022-08-29 DIAGNOSIS — M47816 Spondylosis without myelopathy or radiculopathy, lumbar region: Secondary | ICD-10-CM

## 2022-08-29 DIAGNOSIS — G8929 Other chronic pain: Secondary | ICD-10-CM

## 2022-08-29 MED ORDER — FENTANYL 50 MCG/HR TD PT72: 1.0000 | MEDICATED_PATCH | TRANSDERMAL | 0 refills | Status: DC

## 2022-09-02 ENCOUNTER — Other Ambulatory Visit: Payer: Self-pay | Admitting: Family Medicine

## 2022-09-02 ENCOUNTER — Inpatient Hospital Stay: Payer: Medicare Other | Admitting: Family Medicine

## 2022-09-02 DIAGNOSIS — R197 Diarrhea, unspecified: Secondary | ICD-10-CM

## 2022-09-02 NOTE — Telephone Encounter (Signed)
Medication Refill - Medication: diphenoxylate-atropine (LOMOTIL) 2.5-0.025 MG tablet  Has the patient contacted their pharmacy? No.  Preferred Pharmacy (with phone number or street name):  [id number] [place] [address] Phone:  [phone] Fax:  [phone]    Has the patient been seen for an appointment in the last year OR does the patient have an upcoming appointment? Yes.    Agent: Please be advised that RX refills may take up to 3 business days. We ask that you follow-up with your pharmacy.

## 2022-09-03 ENCOUNTER — Other Ambulatory Visit: Payer: Self-pay | Admitting: Family Medicine

## 2022-09-03 DIAGNOSIS — I10 Essential (primary) hypertension: Secondary | ICD-10-CM

## 2022-09-03 DIAGNOSIS — R32 Unspecified urinary incontinence: Secondary | ICD-10-CM

## 2022-09-05 MED ORDER — DIPHENOXYLATE-ATROPINE 2.5-0.025 MG PO TABS: 1.0000 | ORAL_TABLET | Freq: Four times a day (QID) | ORAL | 1 refills | Status: DC | PRN

## 2022-09-05 NOTE — Telephone Encounter (Signed)
Requested medication (s) are due for refill today: yes  Requested medication (s) are on the active medication list: yes  Last refill:  07/23/2019 #180 with 1 RF  Future visit scheduled: 10/12/22, seen 08/19/22  Notes to clinic:  This medication can not be delegated, please assess.        Requested Prescriptions  Pending Prescriptions Disp Refills   diphenoxylate-atropine (LOMOTIL) 2.5-0.025 MG tablet 180 tablet 1    Sig: Take 1 tablet by mouth 4 (four) times daily as needed.     Not Delegated - Gastroenterology:  Antidiarrheals Failed - 09/02/2022  5:37 PM      Failed - This refill cannot be delegated      Passed - Valid encounter within last 12 months    Recent Outpatient Visits           2 weeks ago NSTEMI (non-ST elevated myocardial infarction) [facility])   [facility] Birdie Sons, MD   3 weeks ago Acute cough   [facility] Birdie Sons, MD   1 month ago Osteoarthritis of left knee, unspecified osteoarthritis type   [facility] Birdie Sons, MD   2 months ago Leg swelling   [facility] McGovern, [place], PA-C   3 months ago Chronic bilateral low back pain without sciatica   [facility] Birdie Sons, MD       Future Appointments             In 1 week Leonie Man, MD [place]. [place]   In 1 month Fisher, Kirstie Peri, MD [facility], [facility]

## 2022-09-09 ENCOUNTER — Ambulatory Visit: Payer: Medicare Other | Admitting: Cardiology

## 2022-09-12 ENCOUNTER — Encounter: Payer: Self-pay | Admitting: Family Medicine

## 2022-09-12 DIAGNOSIS — K529 Noninfective gastroenteritis and colitis, unspecified: Secondary | ICD-10-CM

## 2022-09-12 DIAGNOSIS — K2100 Gastro-esophageal reflux disease with esophagitis, without bleeding: Secondary | ICD-10-CM

## 2022-09-15 ENCOUNTER — Ambulatory Visit: Payer: Medicare Other | Attending: Cardiology | Admitting: Cardiology

## 2022-09-15 ENCOUNTER — Encounter: Payer: Self-pay | Admitting: Cardiology

## 2022-09-15 VITALS — BP 142/90 | HR 75 | Ht 59.0 in | Wt 116.0 lb

## 2022-09-15 DIAGNOSIS — I214 Non-ST elevation (NSTEMI) myocardial infarction: Secondary | ICD-10-CM

## 2022-09-15 DIAGNOSIS — I10 Essential (primary) hypertension: Secondary | ICD-10-CM

## 2022-09-15 DIAGNOSIS — R609 Edema, unspecified: Secondary | ICD-10-CM

## 2022-09-15 DIAGNOSIS — E782 Mixed hyperlipidemia: Secondary | ICD-10-CM | POA: Diagnosis not present

## 2022-09-15 DIAGNOSIS — I5020 Unspecified systolic (congestive) heart failure: Secondary | ICD-10-CM | POA: Diagnosis not present

## 2022-09-15 MED ORDER — LOSARTAN POTASSIUM 25 MG PO TABS: 25.0000 mg | ORAL_TABLET | Freq: Every morning | ORAL | 3 refills | Status: DC

## 2022-09-15 MED ORDER — METOPROLOL SUCCINATE ER 25 MG PO TB24: 25.0000 mg | ORAL_TABLET | Freq: Every evening | ORAL | 3 refills | Status: DC

## 2022-09-15 MED ORDER — FUROSEMIDE 20 MG PO TABS: 20.0000 mg | ORAL_TABLET | Freq: Every day | ORAL | 3 refills | Status: DC

## 2022-09-15 NOTE — Progress Notes (Signed)
Primary Care Provider: Birdie Sons, MD [place] Cardiologist: Glenetta Hew, MD Electrophysiologist: None  Clinic Note: Chief Complaint  Patient presents with   Hospitalization Follow-up    F/u hosp c/o sob. Meds reviewed verbally with pt.   Cardiomyopathy    Likely ischemic cardiomyopathy-recent non-STEMI; reduced EF.  Medical management.    ===================================  ASSESSMENT/PLAN   Problem List Items Addressed This Visit       Cardiology Problems   HFrEF (heart failure with reduced ejection fraction); CHRONIC COMBINED SYSTOLIC AND DIASTOLIC HEART FAILURE - Primary (Chronic)    She had been on 40 mg of Lasix in the past, currently on 20.  I did encourage her to use additional dose if necessary PRN for worsening edema or dyspnea.  Also for weight gain more than 3 pounds. Would like to increase afterload reduction and overall BP control.  Given advanced age, no plans for ICD.  No plans for ischemic evaluation with invasive studies.  Plan:  Increase both Toprol and losartan to 25 mg daily. Stressed the use of Lasix standing dose with PRN dosing for weight gain.. Continue to titrate heart failure medications as best tolerated for her age.  Could potentially switch to [facility].  We will also potentially consider SGLT2 inhibitor or adding spironolactone if additional blood pressure is required.      Relevant Medications   metoprolol succinate (TOPROL-XL) 25 MG 24 hr tablet   losartan (COZAAR) 25 MG tablet   furosemide (LASIX) 20 MG tablet   Other Relevant Orders   EKG 12-Lead (Completed)   Essential (primary) hypertension (Chronic)    Similar plan as noted in heart failure.  Blood pressure still elevated, will increase both Toprol and losartan to 25 mg daily.  Low threshold to titrate up ARB and further or switch to [employer].  Also low threshold to add spironolactone.      Relevant Medications   metoprolol succinate (TOPROL-XL) 25 MG 24 hr  tablet   losartan (COZAAR) 25 MG tablet   furosemide (LASIX) 20 MG tablet   Other Relevant Orders   EKG 12-Lead (Completed)   Hyperlipidemia, mixed (Chronic)    Lipids nowhere near goal for having an MRI with a LP(a) today of 274.  Unfortunately did not have lipids checked.  I suspect these to be checked with PCP if not we will check them at the time of her follow-up visit.  She has eaten today.  For now continue 40 mg atorvastatin.  Was initially started on Lovaza for triglycerides, but could not tolerate because of difficulty swallowing.  Consider switching to Vascepa. .      Relevant Medications   metoprolol succinate (TOPROL-XL) 25 MG 24 hr tablet   losartan (COZAAR) 25 MG tablet   furosemide (LASIX) 20 MG tablet   Other Relevant Orders   EKG 12-Lead (Completed)   NSTEMI (non-ST elevated myocardial infarction) (HCC) (Chronic)    Treated medically in the hospital IV heparin.  Unfortunately pretty significant reduced EF on echo with regional wall motion abnormality, suggesting probable anterior MI.  Plan was for medical management with no intention of pursuing ischemic evaluation. Currently on aspirin.  Low threshold to consider adding Plavix short-term.  On beta-blocker, ARB, statin and aspirin.  With her increased risk of bleeding, probably reasonable to not use Plavix.      Relevant Medications   metoprolol succinate (TOPROL-XL) 25 MG 24 hr tablet   losartan (COZAAR) 25 MG tablet   furosemide (LASIX) 20 MG tablet  Other Relevant Orders   EKG 12-Lead (Completed)     Other   Edema    Continue current dose of Lasix for now, low threshold to increase back up to her previous MI dose of 40 mg       ===================================  HPI:    Julia Salazar is a 88 y.o. female with recent diagnosis of non-STEMI with ICM (EF 30 to 35%, HTN, history of hypertriglyceridemia, GERD, and arthritis who is being seen today for HOSPITAL FOLLOW-UP at the request of Birdie Sons,  MD.  Recent Hospitalizations:  08/09/2022-08/12/2022 [facility] admission): Non-STEMI, medical therapy with 48 hours heparin.  2D echo checked.  Started on atorvastatin along with Lovaza.  Toprol 12.5 mg daily, losartan 12.5 mg daily, 81 mg aspirin. Presentation was incessant coughing followed by chest pain worse with coughing with associated dyspnea.  Had had coughing spells off and on for the last several months but not as protracted.  Also noted upper abdominal pain.  hsTroponin noted to be elevated to ~ 2000 range => treated with IV heparin along with NTG and morphine.   Seen by Dr. Garen Lah in Cardiology Consultation: Recommended conservative management with no plans for interventional procedures.-Somehow she was placed on my schedule. Discharged on aspirin 81 mg, Lipitor 40 mg, 20 mg Lasix, losartan and Toprol 12.5 mg, Toprol 24, along with nitroglycerin.  Also discharged on Lovaza, but did not tolerate.  GLENNIE BOSE was seen on 08/19/2022 by Dr. Caryn Section for hospital follow-up.  Noted excellent compliance with treatment.  Improved overall condition, no further chest pain or dyspnea..  Tolerating atorvastatin, but still having issues with Lovaza (difficulty swallowing). => Stable BP on low-dose beta-blocker and ARB.  No longer on HCTZ.  Reviewed  CV studies:    The following studies were reviewed today: (if available, images/films reviewed: From Epic Chart or Care Everywhere) TTE 08/10/2022: EF 30 to 35%.  Moderate-severe LV dysfunction-severe HK of mid-apical lateral wall, septal wall and apical wall.  GR 1 DD.  Mild to moderate MR.  AOV calcification/sclerosis but no stenosis.  Normal RV size and function mildly increased thickness.  Normal RAP. CTA Chest/ABD/Pel 08/09/2022: Small volume hiatal hernia.  No acute intrapulmonary findings.  Severe aortic plaque noted throughout the thoracic aorta.  Three-vessel coronary artery calcification noted.  Interval History:   ELLI GROESBECK presents here today  for cardiology follow-up.  She indicated that her presenting symptom was that she has been coughing for several hours with shortness of breath and then had chest pain associate with that.  Was found to have elevated troponin thought to be consistent with non-STEMI.  Since discharge, she has been doing okay.  She uses a walker at home but is limited walking by her significant knee pain.  She states on 1 pillow but has the head of her bed elevated more because of history of GERD and PND orthopnea.  Trivial edema.  Prior to admission, she was noting significant exertional dyspnea even walking to the bathroom.  Seems to be doing much better now.  While she has exertional dyspnea, she has not had any get e she can think of pisodes chest pain or pressure since discharge.  1 episode for which she took nitroglycerin since discharge,.  Nitroglycerin relieved the pain pretty quickly.  CV Review of Symptoms (Summary): positive for - chest pain, dyspnea on exertion, edema, palpitations, and shortness of breath negative for - irregular heartbeat, loss of consciousness, orthopnea, paroxysmal nocturnal dyspnea, rapid heart rate,  shortness of breath, or syncope/near syncope or TIA/amaurosis fugax, claudication  REVIEWED OF SYSTEMS   Review of Systems  Constitutional:  Positive for malaise/fatigue. Negative for weight loss.  HENT:  Negative for congestion and nosebleeds.   Respiratory:  Positive for shortness of breath (On exertion).   Cardiovascular:  Negative for claudication.  Gastrointestinal:  Negative for blood in stool and melena.  Genitourinary:  Negative for dysuria.  Musculoskeletal:  Positive for back pain, joint pain and myalgias. Negative for falls.  Neurological:  Positive for dizziness (Sometimes with sitting up quickly). Negative for weakness.  Psychiatric/Behavioral:  Negative for depression. The patient is not nervous/anxious.     I have reviewed and (if needed) personally updated the  patient's problem list, medications, allergies, past medical and surgical history, social and family history.   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 hospital in [place]  2010   right eye   [place] Left 08/30/2018   Alvie Heidelberg, MD [facility]   OOPHORECTOMY      Immunization History  Administered Date(s) Administered   Fluad Quad(high Dose 65+) 08/21/2019, 08/19/2022   Influenza Split 08/09/2011   Influenza, High Dose Seasonal PF 08/24/2015, 09/21/2016, 09/27/2018, 09/20/2021   Influenza-Unspecified 09/25/2017, 09/04/2020   Moderna Sars-Covid-2 Vaccination 12/18/2019, 01/15/2020   PFIZER Comirnaty(Gray Top)Covid-19 Tri-Sucrose Vaccine 04/23/2021   PFIZER(Purple Top)SARS-COV-2 Vaccination 10/22/2020   Pfizer Covid-19 Vaccine Bivalent Booster 51yr & up 09/20/2021   Pneumococcal Conjugate-13 11/03/2014   Pneumococcal Polysaccharide-23 10/17/2012   Td 11/03/2014   Zoster, Live 11/07/2013    MEDICATIONS/ALLERGIES   Current Meds  Medication Sig   aspirin 81 MG tablet Take 81 mg by mouth daily.   atorvastatin (LIPITOR) 40 MG tablet Take 1 tablet (40 mg total) by mouth daily.   brimonidine-timolol (COMBIGAN) 0.2-0.5 % ophthalmic solution Place 1 drop into both eyes every 12  (twelve) hours.   Calcium Carb-Cholecalciferol 600-200 MG-UNIT TABS Take 1 tablet by mouth daily.   carisoprodol (SOMA) 350 MG tablet Take 1 tablet (350 mg total) by mouth every 6 (six) hours as needed for muscle spasms.   diclofenac Sodium (VOLTAREN) 1 % GEL Apply topically 4 (four) times daily.   diphenhydrAMINE (BENADRYL) 25 mg capsule Take 25 mg by mouth at bedtime.    diphenoxylate-atropine (LOMOTIL) 2.5-0.025 MG tablet Take 1 tablet by mouth 4 (four) times daily as needed.   fentaNYL (DURAGESIC) 50 MCG/HR Place 1 patch onto the skin every 3 (three) days.   HYDROcodone-acetaminophen (NORCO/VICODIN) 5-325 MG tablet Take 1 tablet by mouth 3 (three) times daily as needed for moderate pain.   hyoscyamine (ANASPAZ) 0.125 MG TBDP disintergrating tablet Take 2 tablets (0.25 mg total) by mouth every  6 (six) hours as needed (excess oral secretions).   meloxicam (MOBIC) 15 MG tablet TAKE 1 TABLET BY MOUTH DAILY AS NEEDED.   Multiple Vitamins-Minerals (MULTIVITAMIN ADULT PO) Take 1 tablet by mouth daily.   nitroGLYCERIN (NITROSTAT) 0.4 MG SL tablet Place 1 tablet (0.4 mg total) under the tongue every 5 (five) minutes x 3 doses as needed for chest pain.   oxybutynin (DITROPAN-XL) 5 MG 24 hr tablet TAKE 1 TABLET BY MOUTH EVERYDAY AT BEDTIME   '[]'$  furosemide (LASIX) 20 MG tablet Take 1 tablet (20 mg total) by mouth daily.   '[]'$  losartan (COZAAR) 25 MG tablet Take 0.5 tablets (12.5 mg total) by mouth daily.   '[]'$  metoprolol succinate (TOPROL-XL) 25 MG 24 hr tablet Take 0.5 tablets (12.5 mg total) by mouth daily.   '[]'$  pantoprazole (PROTONIX) 40 MG tablet Take 1 tablet (40 mg total) by mouth 2 (two) times daily before a meal. (Patient taking differently: Take 40 mg by mouth daily.)    Allergies  Allergen Reactions   Lovastatin    Sertraline Hcl    Etodolac Rash    SOCIAL HISTORY/FAMILY HISTORY   Reviewed in Epic:   Social History   Tobacco Use   Smoking status: Never   Smokeless tobacco: Never   Vaping Use   Vaping Use: Never used  Substance Use Topics   Alcohol use: No    Alcohol/week: 0.0 standard drinks of alcohol   Drug use: No   Social History   Social History Narrative   Not on file   Family History  Problem Relation Age of Onset   Colon cancer Mother    Heart attack Father    Breast cancer Sister 45   Leukemia Sister    Hodgkin's lymphoma Sister     OBJCTIVE -PE, EKG, labs   Wt Readings from Last 3 Encounters:  09/15/22 116 lb (52.6 kg)  08/19/22 111 lb (50.3 kg)  08/09/22 106 lb (48.1 kg)    Physical Exam: BP (!) 142/90 (BP Location: Right Arm, Patient Position: Sitting, Cuff Size: Normal)   Pulse 75   Ht '4\' 11"'$  (1.499 m)   Wt 116 lb (52.6 kg)   SpO2 97%   BMI 23.43 kg/m  Physical Exam Vitals reviewed.  Constitutional:      General: She is not in acute distress.    Appearance: Normal appearance. She is normal weight. She is not ill-appearing (Well-nourished, well-groomed.) or toxic-appearing.  HENT:     Head: Normocephalic and atraumatic.     Ears:     Comments: Hard of hearing Eyes:     Comments: Blind in left eye.  Neck:     Vascular: No carotid bruit or JVD.  Cardiovascular:     Rate and Rhythm: Normal rate and regular rhythm. Occasional Extrasystoles are present.    Chest Wall: PMI is not displaced.     Pulses: Decreased pulses.     Heart sounds: S1 normal and S2 normal. Heart sounds are distant. Murmur (1-2/6 SEM at RUSB) heard.     No friction rub. No gallop.  Pulmonary:     Effort: Pulmonary effort is normal. No respiratory distress.     Breath sounds: Normal breath sounds. No wheezing, rhonchi or rales.  Musculoskeletal:        General: Swelling (Trivial) present. Normal range of motion.     Cervical back: Normal range of motion and neck supple.  Skin:    General: Skin is warm and dry.  Coloration: Skin is not jaundiced or pale.  Neurological:     General: No focal deficit present.     Mental Status: She is alert and  oriented to person, place, and time.     Motor: No weakness.     Gait: Gait abnormal.  Psychiatric:        Mood and Affect: Mood normal.        Behavior: Behavior normal.        Thought Content: Thought content normal.        Judgment: Judgment normal.     Adult ECG Report  Rate: 75 ;  Rhythm: normal sinus rhythm, premature atrial contractions (PAC), and left axis deviation, LVH.  IVCD/BBB.  Cannot exclude anterior MI, age-indeterminate. ;   Narrative Interpretation: Stable  Recent Labs: Reviewed LP(a) 274.  Interestingly no lipid panel checked. Lab Results  Component Value Date   CHOL 187 10/07/2020   HDL 35 (L) 10/07/2020   LDLCALC 111 (H) 10/07/2020   TRIG 238 (H) 10/07/2020   CHOLHDL 5.3 (H) 10/07/2020   Lab Results  Component Value Date   CREATININE 1.04 (H) 08/23/2022   BUN 17 08/23/2022   NA 140 08/23/2022   K 4.1 08/23/2022   CL 103 08/23/2022   CO2 21 08/23/2022      Latest Ref Rng & Units 08/23/2022   10:52 AM 08/12/2022    5:29 AM 08/11/2022    3:34 AM  CBC  WBC 3.4 - 10.8 x10E3/uL 8.0  8.3  13.3   Hemoglobin 11.1 - 15.9 g/dL 10.8  10.4  11.5   Hematocrit 34.0 - 46.6 % 32.4  30.8  34.1   Platelets 150 - 450 x10E3/uL 289  237  299     No results found for: "HGBA1C" Lab Results  Component Value Date   TSH 4.950 (H) 06/22/2022    ================================================== I spent a total of 37 minutes with the patient spent in direct patient consultation.  Additional time spent with chart review  / charting (studies, outside notes, etc): 31 min Total Time: 68 min  Current medicines are reviewed at length with the patient today.  (+/- concerns) reviewed  Notice: This dictation was prepared with Dragon dictation along with smart phrase technology. Any transcriptional errors that result from this process are unintentional and may not be corrected upon review.   Studies Ordered:  Orders Placed This Encounter  Procedures   EKG 12-Lead   Meds  ordered this encounter  Medications   metoprolol succinate (TOPROL-XL) 25 MG 24 hr tablet    Sig: Take 1 tablet (25 mg total) by mouth every evening.    Dispense:  30 tablet    Refill:  3    09/15/2022 dose increase   losartan (COZAAR) 25 MG tablet    Sig: Take 1 tablet (25 mg total) by mouth in the morning.    Dispense:  30 tablet    Refill:  3    09/15/2022 dose increase start 09/18/22   furosemide (LASIX) 20 MG tablet    Sig: Take 1 tablet (20 mg total) by mouth daily. Take 40 mg in the morning if you weight increases by 3 lbs until back to baseline weight. If your weight increases by 5 lbs, take 40 mg in the morning and 20 mg in the afternoon until back to baseline weight    Dispense:  60 tablet    Refill:  3    09/15/2022 change in directions    Patient Instructions /  Medication Changes & Studies & Tests Ordered   Patient Instructions  Medication Instructions:  Your physician has recommended you make the following change in your medication:  Increase metoprolol succinate to 25 mg daily in the evening starting tonight After 3 days of metoprolol increase, (09/18/22) increase losartan to 25 mg daily in the morning You may take furosemide 40 mg in the morning if your weight increases by 3 lbs in 24 hours until back to baseline weight. Then resume previous dose. If your weight increases by 5 lbs in one week, take 40 mg in the morning and 20 mg in the afternoon until back to baseline weight, then resume previous dose. Continue other medications the same *If you need a refill on your cardiac medications before your next appointment, please call your pharmacy*   Lab Work: None  If you have labs (blood work) drawn today and your tests are completely normal, you will receive your results only by: [place] (if you have MyChart) OR A paper copy in the mail If you have any lab test that is abnormal or we need to change your treatment, we will call you to review the  results.   Testing/Procedures: none  Follow-Up: At [facility], you and your health needs are our priority.  As part of our continuing mission to provide you with exceptional heart care, we have created designated Provider Care Teams.  These Care Teams include your primary Cardiologist (physician) and Advanced Practice Providers (APPs -  Physician Assistants and Nurse Practitioners) who all work together to provide you with the care you need, when you need it.  We recommend signing up for the patient portal called "MyChart".  Sign up information is provided on this After Visit Summary.  MyChart is used to connect with patients for Virtual Visits (Telemedicine).  Patients are able to view lab/test results, encounter notes, upcoming appointments, etc.  Non-urgent messages can be sent to your provider as well.   To learn more about what you can do with MyChart, go to [email or web address].    Your next appointment:   Mid December to early January 2024  The format for your next appointment:   In Person  Provider:   You may see Glenetta Hew, MD or one of the following Advanced Practice Providers on your designated Care Team:   Murray Hodgkins, NP Christell Faith, PA-C Cadence Kathlen Mody, PA-C Gerrie Nordmann, NP    Other Instructions Your physician recommends that you weigh, daily, at the same time every day, and in the same amount of clothing. Please record your daily weights on the handout provided and bring it to your next appointment.   Important Information About Sugar          Leonie Man, MD, MS Glenetta Hew, M.D., M.S. Interventional Cardiologist  [facility]   [address] [place], [place]  [address] [phone]           Fax [phone]    Thank you for choosing [facility] in [place]!!

## 2022-09-15 NOTE — Patient Instructions (Addendum)
Medication Instructions:  Your physician has recommended you make the following change in your medication:  Increase metoprolol succinate to 25 mg daily in the evening starting tonight After 3 days of metoprolol increase, (09/18/22) increase losartan to 25 mg daily in the morning You may take furosemide 40 mg in the morning if your weight increases by 3 lbs in 24 hours until back to baseline weight. Then resume previous dose. If your weight increases by 5 lbs in one week, take 40 mg in the morning and 20 mg in the afternoon until back to baseline weight, then resume previous dose. Continue other medications the same *If you need a refill on your cardiac medications before your next appointment, please call your pharmacy*   Lab Work: None  If you have labs (blood work) drawn today and your tests are completely normal, you will receive your results only by: [place] (if you have MyChart) OR A paper copy in the mail If you have any lab test that is abnormal or we need to change your treatment, we will call you to review the results.   Testing/Procedures: none  Follow-Up: At [facility], you and your health needs are our priority.  As part of our continuing mission to provide you with exceptional heart care, we have created designated Provider Care Teams.  These Care Teams include your primary Cardiologist (physician) and Advanced Practice Providers (APPs -  Physician Assistants and Nurse Practitioners) who all work together to provide you with the care you need, when you need it.  We recommend signing up for the patient portal called "MyChart".  Sign up information is provided on this After Visit Summary.  MyChart is used to connect with patients for Virtual Visits (Telemedicine).  Patients are able to view lab/test results, encounter notes, upcoming appointments, etc.  Non-urgent messages can be sent to your provider as well.   To learn more about what you can do with MyChart,  go to [email or web address].    Your next appointment:   Mid December to early January 2024  The format for your next appointment:   In Person  Provider:   You may see Glenetta Hew, MD or one of the following Advanced Practice Providers on your designated Care Team:   Murray Hodgkins, NP Christell Faith, PA-C Cadence Kathlen Mody, PA-C Gerrie Nordmann, NP    Other Instructions Your physician recommends that you weigh, daily, at the same time every day, and in the same amount of clothing. Please record your daily weights on the handout provided and bring it to your next appointment.   Important Information About Sugar

## 2022-09-20 MED ORDER — CHOLESTYRAMINE LIGHT 4 GM/DOSE PO POWD: ORAL | 5 refills | Status: DC

## 2022-09-20 NOTE — Addendum Note (Signed)
Addended by: Lelon Huh E on: 09/20/2022 10:01 AM   Modules accepted: Orders

## 2022-09-21 ENCOUNTER — Other Ambulatory Visit: Payer: Self-pay | Admitting: Family Medicine

## 2022-09-21 MED ORDER — FLUOXETINE HCL 40 MG PO CAPS: 40.0000 mg | ORAL_CAPSULE | Freq: Every day | ORAL | 2 refills | Status: DC

## 2022-09-26 MED ORDER — PANTOPRAZOLE SODIUM 40 MG PO TBEC: 40.0000 mg | DELAYED_RELEASE_TABLET | Freq: Every day | ORAL | 3 refills | Status: DC

## 2022-09-26 NOTE — Addendum Note (Signed)
Addended by: Birdie Sons on: 09/26/2022 06:15 PM   Modules accepted: Orders

## 2022-09-28 ENCOUNTER — Encounter: Payer: Self-pay | Admitting: Physician Assistant

## 2022-09-28 ENCOUNTER — Ambulatory Visit: Payer: Self-pay | Admitting: *Deleted

## 2022-09-28 ENCOUNTER — Ambulatory Visit (INDEPENDENT_AMBULATORY_CARE_PROVIDER_SITE_OTHER): Payer: Medicare Other | Admitting: Physician Assistant

## 2022-09-28 VITALS — BP 128/77 | HR 76

## 2022-09-28 DIAGNOSIS — R8271 Bacteriuria: Secondary | ICD-10-CM

## 2022-09-28 DIAGNOSIS — R410 Disorientation, unspecified: Secondary | ICD-10-CM

## 2022-09-28 LAB — POCT URINALYSIS DIPSTICK
Bilirubin, UA: NEGATIVE
Glucose, UA: NEGATIVE
Ketones, UA: NEGATIVE
Nitrite, UA: NEGATIVE
Protein, UA: POSITIVE — AB
Spec Grav, UA: 1.015 (ref 1.010–1.025)
Urobilinogen, UA: 0.2 E.U./dL
pH, UA: 6 (ref 5.0–8.0)

## 2022-09-28 NOTE — Progress Notes (Signed)
I,Sha'taria Tyson,acting as a Education administrator for [employer], PA-C.,have documented all relevant documentation on the behalf of Julia Kirschner, PA-C,as directed by  Julia Kirschner, PA-C while in the presence of Julia Kirschner, PA-C.   Established patient visit   Patient: Julia Salazar   DOB: 03-28-1929   88 y.o. Female  MRN: [id number] Visit Date: 09/28/2022  Today's healthcare provider: [name], PA-C   Cc. Confusion x 2 days  Subjective    HPI   Pt reports increased confusion starting Monday. Examples given-- forgetting how to brush teeth. Pt has felt more uncertain. Denies fever, chills, sob, chest pain, dizziness, falls, changes in vision, weakness. Denies dysuria, frequency, abdominal pain, sometimes has some urinary hesitancy, but this did not start recently.    Medications: Outpatient Medications Prior to Visit  Medication Sig   aspirin 81 MG tablet Take 81 mg by mouth daily.   atorvastatin (LIPITOR) 40 MG tablet Take 1 tablet (40 mg total) by mouth daily.   brimonidine-timolol (COMBIGAN) 0.2-0.5 % ophthalmic solution Place 1 drop into both eyes every 12 (twelve) hours.   Calcium Carb-Cholecalciferol 600-200 MG-UNIT TABS Take 1 tablet by mouth daily.   carisoprodol (SOMA) 350 MG tablet Take 1 tablet (350 mg total) by mouth every 6 (six) hours as needed for muscle spasms.   cholestyramine light (PREVALITE) 4 GM/DOSE powder MIX 2 GRAMS WITH FLUIDS AND DRINK EVERY 2-3 DAYS AS NEEDED FOR LOOSE BOWELS   diclofenac Sodium (VOLTAREN) 1 % GEL Apply topically 4 (four) times daily.   diphenhydrAMINE (BENADRYL) 25 mg capsule Take 25 mg by mouth at bedtime.    diphenoxylate-atropine (LOMOTIL) 2.5-0.025 MG tablet Take 1 tablet by mouth 4 (four) times daily as needed.   fentaNYL (DURAGESIC) 50 MCG/HR Place 1 patch onto the skin every 3 (three) days.   FLUoxetine (PROZAC) 40 MG capsule Take 1 capsule (40 mg total) by mouth daily.   furosemide (LASIX) 20 MG tablet Take 1 tablet (20 mg  total) by mouth daily. Take 40 mg in the morning if you weight increases by 3 lbs until back to baseline weight. If your weight increases by 5 lbs, take 40 mg in the morning and 20 mg in the afternoon until back to baseline weight   HYDROcodone-acetaminophen (NORCO/VICODIN) 5-325 MG tablet Take 1 tablet by mouth 3 (three) times daily as needed for moderate pain.   hyoscyamine (ANASPAZ) 0.125 MG TBDP disintergrating tablet Take 2 tablets (0.25 mg total) by mouth every 6 (six) hours as needed (excess oral secretions).   losartan (COZAAR) 25 MG tablet Take 1 tablet (25 mg total) by mouth in the morning.   meloxicam (MOBIC) 15 MG tablet TAKE 1 TABLET BY MOUTH DAILY AS NEEDED.   metoprolol succinate (TOPROL-XL) 25 MG 24 hr tablet Take 1 tablet (25 mg total) by mouth every evening.   Multiple Vitamins-Minerals (MULTIVITAMIN ADULT PO) Take 1 tablet by mouth daily.   nitroGLYCERIN (NITROSTAT) 0.4 MG SL tablet Place 1 tablet (0.4 mg total) under the tongue every 5 (five) minutes x 3 doses as needed for chest pain.   oxybutynin (DITROPAN-XL) 5 MG 24 hr tablet TAKE 1 TABLET BY MOUTH EVERYDAY AT BEDTIME   pantoprazole (PROTONIX) 40 MG tablet Take 1 tablet (40 mg total) by mouth daily.   No facility-administered medications prior to visit.    Review of Systems  Constitutional:  Negative for fatigue and fever.  Respiratory:  Negative for cough and shortness of breath.   Cardiovascular:  Negative for  chest pain and leg swelling.  Gastrointestinal:  Negative for abdominal pain.  Neurological:  Negative for dizziness and headaches.       Confusion     Objective    BP 128/77 Comment: home value this AM  Pulse 76  Blood pressure 128/77, pulse 76.   Physical Exam Constitutional:      General: She is awake. She is not in acute distress.    Appearance: She is well-developed. She is not ill-appearing.  HENT:     Head: Normocephalic.  Eyes:     Conjunctiva/sclera: Conjunctivae normal.  Cardiovascular:      Rate and Rhythm: Normal rate and regular rhythm.     Heart sounds: Normal heart sounds. No murmur heard. Pulmonary:     Effort: Pulmonary effort is normal.     Breath sounds: Normal breath sounds.  Skin:    General: Skin is warm.  Neurological:     General: No focal deficit present.     Mental Status: She is alert and oriented to person, place, and time.     Cranial Nerves: No facial asymmetry.     Sensory: Sensation is intact.     Motor: No weakness.     Comments: Grip strength equal b/l  Psychiatric:        Attention and Perception: Attention normal.        Mood and Affect: Mood normal.        Speech: Speech normal.        Behavior: Behavior normal. Behavior is cooperative.     Results for orders placed or performed in visit on 09/28/22  POCT Urinalysis Dipstick  Result Value Ref Range   Color, UA     Clarity, UA     Glucose, UA Negative Negative   Bilirubin, UA negative    Ketones, UA negative    Spec Grav, UA 1.015 1.010 - 1.025   Blood, UA trace    pH, UA 6.0 5.0 - 8.0   Protein, UA Positive (A) Negative   Urobilinogen, UA 0.2 0.2 or 1.0 E.U./dL   Nitrite, UA negative    Leukocytes, UA Moderate (2+) (A) Negative   Appearance     Odor        09/28/2022    3:20 PM  MMSE - Mini Mental State Exam  Orientation to time 2  Orientation to Place 5  Registration 3  Attention/ Calculation 5  Recall 2  Language- name 2 objects 2  Language- repeat 1  Language- follow 3 step command 3  Language- read & follow direction 1  Write a sentence 1  Copy design 1  Total score 26      Assessment & Plan     Problem List Items Addressed This Visit       Nervous and Auditory   Confusion - Primary    Oriented in office today , MSE 26 UA + leuk and trace blood will send for culture Will wait for culture to treat as otherwise asymptomatic  Low sus CVA, arrhythmia, pneumonia If culture negative and no improvement would repeat labs. Labs reviewed from 9/23 Pt does not  drive and is with family 24/7, even overnight someone is with her        Relevant Orders   POCT Urinalysis Dipstick (Completed)   Other Visit Diagnoses     Bacteriuria       Relevant Orders   Urine Culture   Urinalysis, microscopic only        Return  if symptoms worsen or fail to improve.      I, Julia Kirschner, PA-C have reviewed all documentation for this visit. The documentation on  09/28/2022  for the exam, diagnosis, procedures, and orders are all accurate and complete.  Julia Kirschner, PA-C [facility] [address] [place], [place], [address] Office: [phone] Fax: [place]

## 2022-09-28 NOTE — Assessment & Plan Note (Addendum)
Oriented in office today , MSE 26 UA + leuk and trace blood will send for culture Will wait for culture to treat as otherwise asymptomatic  Low sus CVA, arrhythmia, pneumonia If culture negative and no improvement would repeat labs. Labs reviewed from 9/23 Pt does not drive and is with family 24/7, even overnight someone is with her

## 2022-09-28 NOTE — Telephone Encounter (Signed)
Reason for Disposition  [1] Longstanding confusion (e.g., dementia, stroke) AND [2] worsening  Answer Assessment - Initial Assessment Questions 1. LEVEL OF CONSCIOUSNESS: "How is he (she, the patient) acting right now?" (e.g., alert-oriented, confused, lethargic, stuporous, comatose)     Confused- increased 2. ONSET: "When did the confusion start?"  (minutes, hours, days)     Monday am 3. PATTERN "Does this come and go, or has it been constant since it started?"  "Is it present now?"     Constant- present now 4. ALCOHOL or DRUGS: "Has he been drinking alcohol or taking any drugs?"      no 5. NARCOTIC MEDICINES: "Has he been receiving any narcotic medications?" (e.g., morphine, Vicodin)     no 6. CAUSE: "What do you think is causing the confusion?"      unsure 7. OTHER SYMPTOMS: "Are there any other symptoms?" (e.g., difficulty breathing, headache, fever, weakness)     no  Protocols used: Confusion - Delirium-A-AH

## 2022-09-28 NOTE — Telephone Encounter (Signed)
  Chief Complaint: increased confusion  Symptoms: confused with task/thoughts- no other symptoms Frequency: started Monday Pertinent Negatives: Patient denies difficulty breathing, headache, fever, weakness Disposition: '[]'$ ED /'[]'$ Urgent Care (no appt availability in office) / '[x]'$ Appointment(In office/virtual)/ '[]'$  [facility]/ '[]'$ Home Care/ '[]'$ Refused Recommended Disposition /'[]'$ [facility]/ '[]'$  Follow-up with PCP Additional Notes: Patient's daughter,Kathy Snipes calling to report mental changes- confusion. No other symptoms noted.

## 2022-09-29 LAB — URINALYSIS, MICROSCOPIC ONLY: Casts: NONE SEEN /lpf

## 2022-09-30 LAB — URINE CULTURE

## 2022-10-05 ENCOUNTER — Other Ambulatory Visit: Payer: Self-pay | Admitting: Family Medicine

## 2022-10-05 DIAGNOSIS — M47816 Spondylosis without myelopathy or radiculopathy, lumbar region: Secondary | ICD-10-CM

## 2022-10-05 DIAGNOSIS — G8929 Other chronic pain: Secondary | ICD-10-CM

## 2022-10-06 ENCOUNTER — Other Ambulatory Visit: Payer: Self-pay | Admitting: Family Medicine

## 2022-10-06 DIAGNOSIS — K2100 Gastro-esophageal reflux disease with esophagitis, without bleeding: Secondary | ICD-10-CM

## 2022-10-06 MED ORDER — FENTANYL 50 MCG/HR TD PT72: 1.0000 | MEDICATED_PATCH | TRANSDERMAL | 0 refills | Status: DC

## 2022-10-06 MED ORDER — PANTOPRAZOLE SODIUM 40 MG PO TBEC: 40.0000 mg | DELAYED_RELEASE_TABLET | Freq: Every day | ORAL | 3 refills | Status: DC

## 2022-10-09 ENCOUNTER — Encounter: Payer: Self-pay | Admitting: Cardiology

## 2022-10-09 NOTE — Assessment & Plan Note (Signed)
Similar plan as noted in heart failure.  Blood pressure still elevated, will increase both Toprol and losartan to 25 mg daily.  Low threshold to titrate up ARB and further or switch to [employer].  Also low threshold to add spironolactone.

## 2022-10-09 NOTE — Assessment & Plan Note (Addendum)
Treated medically in the hospital IV heparin.  Unfortunately pretty significant reduced EF on echo with regional wall motion abnormality, suggesting probable anterior MI.  Plan was for medical management with no intention of pursuing ischemic evaluation. Currently on aspirin.  Low threshold to consider adding Plavix short-term.  On beta-blocker, ARB, statin and aspirin.  With her increased risk of bleeding, probably reasonable to not use Plavix.

## 2022-10-09 NOTE — Assessment & Plan Note (Signed)
Continue current dose of Lasix for now, low threshold to increase back up to her previous MI dose of 40 mg

## 2022-10-09 NOTE — Assessment & Plan Note (Signed)
Lipids nowhere near goal for having an MRI with a LP(a) today of 274.  Unfortunately did not have lipids checked.  I suspect these to be checked with PCP if not we will check them at the time of her follow-up visit.  She has eaten today.  For now continue 40 mg atorvastatin.  Was initially started on Lovaza for triglycerides, but could not tolerate because of difficulty swallowing.  Consider switching to Vascepa. Julia Salazar

## 2022-10-09 NOTE — Assessment & Plan Note (Addendum)
She had been on 40 mg of Lasix in the past, currently on 20.  I did encourage her to use additional dose if necessary PRN for worsening edema or dyspnea.  Also for weight gain more than 3 pounds. Would like to increase afterload reduction and overall BP control.  Given advanced age, no plans for ICD.  No plans for ischemic evaluation with invasive studies.  Plan:  Increase both Toprol and losartan to 25 mg daily. Stressed the use of Lasix standing dose with PRN dosing for weight gain.. Continue to titrate heart failure medications as best tolerated for her age.  Could potentially switch to [facility].  We will also potentially consider SGLT2 inhibitor or adding spironolactone if additional blood pressure is required.

## 2022-10-10 ENCOUNTER — Encounter: Payer: Self-pay | Admitting: Family Medicine

## 2022-10-10 DIAGNOSIS — M1712 Unilateral primary osteoarthritis, left knee: Secondary | ICD-10-CM

## 2022-10-11 MED ORDER — HYDROCODONE-ACETAMINOPHEN 5-325 MG PO TABS: 1.0000 | ORAL_TABLET | Freq: Three times a day (TID) | ORAL | 0 refills | Status: DC | PRN

## 2022-10-11 NOTE — Telephone Encounter (Signed)
Please advise on refill request. Patient's daughter states the directions are 1 tablet every 6 hours, which is different than what is listed in chart. Please review and advise.

## 2022-10-12 ENCOUNTER — Ambulatory Visit (INDEPENDENT_AMBULATORY_CARE_PROVIDER_SITE_OTHER): Payer: Medicare Other | Admitting: Family Medicine

## 2022-10-12 ENCOUNTER — Encounter: Payer: Self-pay | Admitting: Family Medicine

## 2022-10-12 VITALS — BP 142/60 | HR 77 | Resp 16 | Wt 110.0 lb

## 2022-10-12 DIAGNOSIS — M1712 Unilateral primary osteoarthritis, left knee: Secondary | ICD-10-CM | POA: Diagnosis not present

## 2022-10-12 DIAGNOSIS — M25559 Pain in unspecified hip: Secondary | ICD-10-CM | POA: Diagnosis not present

## 2022-10-12 DIAGNOSIS — G894 Chronic pain syndrome: Secondary | ICD-10-CM

## 2022-10-12 DIAGNOSIS — M47816 Spondylosis without myelopathy or radiculopathy, lumbar region: Secondary | ICD-10-CM | POA: Diagnosis not present

## 2022-10-12 DIAGNOSIS — K529 Noninfective gastroenteritis and colitis, unspecified: Secondary | ICD-10-CM

## 2022-10-12 DIAGNOSIS — K219 Gastro-esophageal reflux disease without esophagitis: Secondary | ICD-10-CM

## 2022-10-12 DIAGNOSIS — I10 Essential (primary) hypertension: Secondary | ICD-10-CM

## 2022-10-12 MED ORDER — HYDROCODONE-ACETAMINOPHEN 7.5-325 MG PO TABS: 1.0000 | ORAL_TABLET | Freq: Four times a day (QID) | ORAL | 0 refills | Status: DC | PRN

## 2022-10-12 MED ORDER — CHOLESTYRAMINE LIGHT 4 GM/DOSE PO POWD: ORAL | Status: DC

## 2022-10-12 NOTE — Progress Notes (Signed)
I,Tiffany J Bragg,acting as a scribe for Lelon Huh, MD.,have documented all relevant documentation on the behalf of Lelon Huh, MD,as directed by  Lelon Huh, MD while in the presence of Lelon Huh, MD.   Established patient visit   Patient: Julia Salazar   DOB: [birth year]   88 y.o. Female  MRN: [id number] Visit Date: 10/12/2022  Today's healthcare provider: Lelon Huh, MD    Subjective    HPI  Here to follow up chronic back and knee pain. Has been to Dr. Sabra Heck and had steroid injections for knee which only provided relief for a week or so. Is using OTC diclofenac gel which helps. Is doing well with Fentanyl patch and has significantly reduced the amount of hydrocodone. Was previously alternating '5mg'$  and 7.'5mg'$  but no longer takes the '5mg'$  tablets.   Her pantoprazole was doubled at her hospitalization in September apparently due to chest pain. She has since gone back to 1 daily and is not having any reflux symptoms, but will be running out of the medication early due the time she was taking BID.   Medications: Outpatient Medications Prior to Visit  Medication Sig   aspirin 81 MG tablet Take 81 mg by mouth daily.   atorvastatin (LIPITOR) 40 MG tablet Take 1 tablet (40 mg total) by mouth daily.   brimonidine-timolol (COMBIGAN) 0.2-0.5 % ophthalmic solution Place 1 drop into both eyes every 12 (twelve) hours.   Calcium Carb-Cholecalciferol 600-200 MG-UNIT TABS Take 1 tablet by mouth daily.   carisoprodol (SOMA) 350 MG tablet Take 1 tablet (350 mg total) by mouth every 6 (six) hours as needed for muscle spasms.   cholestyramine light (PREVALITE) 4 GM/DOSE powder MIX 2 GRAMS WITH FLUIDS AND DRINK EVERY 2-3 DAYS AS NEEDED FOR LOOSE BOWELS   diclofenac Sodium (VOLTAREN) 1 % GEL Apply topically 4 (four) times daily.   diphenhydrAMINE (BENADRYL) 25 mg capsule Take 25 mg by mouth at bedtime.    diphenoxylate-atropine (LOMOTIL) 2.5-0.025 MG tablet Take 1 tablet by mouth 4  (four) times daily as needed.   fentaNYL (DURAGESIC) 50 MCG/HR Place 1 patch onto the skin every 3 (three) days.   FLUoxetine (PROZAC) 40 MG capsule Take 1 capsule (40 mg total) by mouth daily.   furosemide (LASIX) 20 MG tablet Take 1 tablet (20 mg total) by mouth daily. Take 40 mg in the morning if you weight increases by 3 lbs until back to baseline weight. If your weight increases by 5 lbs, take 40 mg in the morning and 20 mg in the afternoon until back to baseline weight   HYDROcodone-acetaminophen (NORCO/VICODIN) 5-325 MG tablet Take 1 tablet by mouth 3 (three) times daily as needed for moderate pain.   hyoscyamine (ANASPAZ) 0.125 MG TBDP disintergrating tablet Take 2 tablets (0.25 mg total) by mouth every 6 (six) hours as needed (excess oral secretions).   losartan (COZAAR) 25 MG tablet Take 1 tablet (25 mg total) by mouth in the morning.   meloxicam (MOBIC) 15 MG tablet TAKE 1 TABLET BY MOUTH DAILY AS NEEDED.   metoprolol succinate (TOPROL-XL) 25 MG 24 hr tablet Take 1 tablet (25 mg total) by mouth every evening.   Multiple Vitamins-Minerals (MULTIVITAMIN ADULT PO) Take 1 tablet by mouth daily.   nitroGLYCERIN (NITROSTAT) 0.4 MG SL tablet Place 1 tablet (0.4 mg total) under the tongue every 5 (five) minutes x 3 doses as needed for chest pain.   oxybutynin (DITROPAN-XL) 5 MG 24 hr tablet TAKE 1 TABLET BY  MOUTH EVERYDAY AT BEDTIME   pantoprazole (PROTONIX) 40 MG tablet Take 1 tablet (40 mg total) by mouth daily.   No facility-administered medications prior to visit.    Review of Systems  Constitutional:  Negative for appetite change, chills, fatigue and fever.  Respiratory:  Negative for chest tightness and shortness of breath.   Cardiovascular:  Negative for chest pain and palpitations.  Gastrointestinal:  Negative for abdominal pain, nausea and vomiting.  Neurological:  Negative for dizziness and weakness.       Objective    BP (!) 142/60 (BP Location: Right Arm, Patient Position:  Sitting, Cuff Size: Normal)   Pulse 77   Resp 16   Wt 110 lb (49.9 kg)   SpO2 97%   BMI 22.22 kg/m    Physical Exam   General appearance: Well developed, well nourished female, cooperative and in no acute distress Head: Normocephalic, without obvious abnormality, atraumatic Respiratory: Respirations even and unlabored, normal respiratory rate Extremities: All extremities are intact.  Skin: Skin color, texture, turgor normal. No rashes seen  Psych: Appropriate mood and affect. Neurologic: Mental status: Alert, oriented to person, place, and time, thought content appropriate.   Assessment & Plan     1. Lumbar spondylosis   2. Pain in joint involving pelvic region and thigh, unspecified laterality   3. Osteoarthritis of left knee, unspecified osteoarthritis type Much better and significantly reduced hydrocodone/apap since starting Fentanyl patch Refill  HYDROcodone-acetaminophen (NORCO) 7.5-325 MG tablet; Take 1 tablet by mouth every 6 (six) hours as needed for moderate pain. Take 3 tab daily PRN  Dispense: 360 tablet; Refill: 0  4. Chronic pain syndrome   5. Essential (primary) hypertension Fairly well controlled.   6. Gastroesophageal reflux disease, unspecified whether esophagitis present Was discharged on BID pantoprazole which insurance is not covering. Has since reduced to QD with no chest pain or reflux symptoms. She can take OTC Nexium in place of pantoprazole until her insurance covers the pantoprazole refill.   7. Chronic diarrhea She is taking Lomotil and cholestyramine prn, but gets constipated when she take frequently. Will try scheduled dose of cholestyramine light (PREVALITE) 4 GM/DOSE powder; MIX 1 GRAMS WITH FLUIDS AND DRINK DAILY OR AS NEEDED FOR LOOSE BOWELS      The entirety of the information documented in the History of Present Illness, Review of Systems and Physical Exam were personally obtained by me. Portions of this information were initially  documented by the CMA and reviewed by me for thoroughness and accuracy.     Lelon Huh, MD  [facility] [phone] (phone) [phone] (fax)  [place]

## 2022-10-12 NOTE — Patient Instructions (Addendum)
Please review the attached list of medications and notify my office if there are any errors.   Please bring all of your medications to every appointment so we can make sure that our medication list is the same as yours.   Try Osteo-Biflex to help with arthritic knee pain  You can take OTC Nexium once a day until we get the pantoprazole approved

## 2022-10-14 ENCOUNTER — Other Ambulatory Visit: Payer: Self-pay | Admitting: Family Medicine

## 2022-10-14 DIAGNOSIS — M1712 Unilateral primary osteoarthritis, left knee: Secondary | ICD-10-CM

## 2022-10-16 ENCOUNTER — Encounter: Payer: Self-pay | Admitting: Family Medicine

## 2022-10-26 ENCOUNTER — Telehealth: Payer: Self-pay | Admitting: Cardiology

## 2022-10-26 MED ORDER — FUROSEMIDE 20 MG PO TABS: 20.0000 mg | ORAL_TABLET | Freq: Every day | ORAL | 2 refills | Status: DC

## 2022-10-26 NOTE — Telephone Encounter (Signed)
Requested Prescriptions   Signed Prescriptions Disp Refills   furosemide (LASIX) 20 MG tablet 180 tablet 2    Sig: Take 1 tablet (20 mg total) by mouth daily. Take 40 mg in the morning if you weight increases by 3 lbs until back to baseline weight. If your weight increases by 5 lbs, take 40 mg in the morning and 20 mg in the afternoon until back to baseline weight    Authorizing Provider: Leonie Man    Ordering User: Britt Bottom

## 2022-10-26 NOTE — Telephone Encounter (Signed)
*  STAT* If patient is at the pharmacy, call can be transferred to refill team.   1. Which medications need to be refilled? (please list name of each medication and dose if known) furosemide (LASIX) 20 MG tablet  2. Which pharmacy/location (including street and city if local pharmacy) is medication to be sent to? [id number] [place] - [address]   3. Do they need a 30 day or 90 day supply? [id number]

## 2022-11-04 ENCOUNTER — Other Ambulatory Visit: Payer: Self-pay | Admitting: Family Medicine

## 2022-11-04 DIAGNOSIS — G8929 Other chronic pain: Secondary | ICD-10-CM

## 2022-11-04 DIAGNOSIS — M47816 Spondylosis without myelopathy or radiculopathy, lumbar region: Secondary | ICD-10-CM

## 2022-11-04 MED ORDER — FENTANYL 50 MCG/HR TD PT72: 1.0000 | MEDICATED_PATCH | TRANSDERMAL | 0 refills | Status: DC

## 2022-11-17 ENCOUNTER — Ambulatory Visit: Payer: Medicare Other | Admitting: Cardiology

## 2022-11-17 ENCOUNTER — Ambulatory Visit: Payer: Medicare Other | Attending: Cardiology | Admitting: Cardiology

## 2022-11-17 ENCOUNTER — Encounter: Payer: Self-pay | Admitting: Cardiology

## 2022-11-17 VITALS — BP 176/87 | HR 77 | Ht 59.0 in | Wt 104.6 lb

## 2022-11-17 DIAGNOSIS — I10 Essential (primary) hypertension: Secondary | ICD-10-CM

## 2022-11-17 DIAGNOSIS — I214 Non-ST elevation (NSTEMI) myocardial infarction: Secondary | ICD-10-CM

## 2022-11-17 DIAGNOSIS — E782 Mixed hyperlipidemia: Secondary | ICD-10-CM | POA: Diagnosis not present

## 2022-11-17 DIAGNOSIS — I2510 Atherosclerotic heart disease of native coronary artery without angina pectoris: Secondary | ICD-10-CM

## 2022-11-17 DIAGNOSIS — I5020 Unspecified systolic (congestive) heart failure: Secondary | ICD-10-CM

## 2022-11-17 MED ORDER — LOSARTAN POTASSIUM 50 MG PO TABS: 50.0000 mg | ORAL_TABLET | Freq: Every day | ORAL | 3 refills | Status: AC

## 2022-11-17 MED ORDER — FUROSEMIDE 20 MG PO TABS: 20.0000 mg | ORAL_TABLET | ORAL | 3 refills | Status: DC

## 2022-11-17 NOTE — Assessment & Plan Note (Signed)
Blood pressure remains high.  I am sort of okay with it not being low.  I would prefer her pressures to be in the 130s to 160s and not lower.  Will therefore further titrate up ARB with plans to potentially convert to [facility] at next visit. If not converting to [facility], would potentially add spironolactone 12.5 mg daily and further titrate ARB.  Will make gradual changes. She should labs checked at next visit either at the visit or following the visit with fasting lipid panel checked as well.

## 2022-11-17 NOTE — Patient Instructions (Signed)
Medication Instructions:  Your physician has recommended you make the following change in your medication:   INCREASE Losartan to 50 mg once daily  Continue Furosemide as directed.    *If you need a refill on your cardiac medications before your next appointment, please call your pharmacy*   Lab Work: None  If you have labs (blood work) drawn today and your tests are completely normal, you will receive your results only by: [place] (if you have MyChart) OR A paper copy in the mail If you have any lab test that is abnormal or we need to change your treatment, we will call you to review the results.   Testing/Procedures: None   Follow-Up: At [facility], you and your health needs are our priority.  As part of our continuing mission to provide you with exceptional heart care, we have created designated Provider Care Teams.  These Care Teams include your primary Cardiologist (physician) and Advanced Practice Providers (APPs -  Physician Assistants and Nurse Practitioners) who all work together to provide you with the care you need, when you need it.   Your next appointment:   3 month(s)  The format for your next appointment:   In Person  Provider:   You will see one of the following Advanced Practice Providers on your designated Care Team:   Murray Hodgkins, NP Christell Faith, PA-C Cadence Kathlen Mody, PA-C Gerrie Nordmann, NP     Important Information About Sugar

## 2022-11-17 NOTE — Assessment & Plan Note (Signed)
Presented with a non-STEMI, elevated troponin.  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  Based on initial discussion with the patient in the hospital and then again in the clinic last time we decided to forego stress test or coronary CTA which would potentially do heart catheterization because she would not be assisted in that.  Plan: Continue aspirin, beta-blocker and ARB along with statin. By convention, lipid diagnosis of non-STEMI in the hospital, would have considered DAPT for short period of time, however this was not done.  Bleeding concerns.  As long as she is not having any additional symptoms, I think we are okay with aspirin monotherapy.

## 2022-11-17 NOTE — Assessment & Plan Note (Signed)
Doing pretty well.  1 episode where she take extra Lasix.  They are worried about running out of Lasix, we can adjust the prescription to make sure that she does not run out.  Blood pressure is high today so we can further titrate her medications.  If still elevated, I may be of switch from losartan to Entresto at next visit.  Plan:  Increase losartan to 50 mg daily, continue current dose of Toprol to avoid bradycardia Change Lasix prescription to 1 to 2 tablets daily, as directed -> Disp 180 tab ROV ~2-3 months with APP - with plan to: Convert Losartan to Entresto if BP remains significantly elevated, vs. Simply further titration of ARB If ARB used - would add low dose Spironolactone 12.5 mg.

## 2022-11-17 NOTE — Assessment & Plan Note (Signed)
She has not had lipids checked in quite some time.  She did have elevated LP(a), but I cannot see using PCSK9 inhibitors in a 88 year old.  She remains on atorvastatin 40 mg daily.  I would like to see some improvement in lipids. Will try deceiving get checked potentially @ or prior to next f/u visit with APP.

## 2022-11-17 NOTE — Progress Notes (Signed)
Primary Care Provider: Birdie Sons, MD [place] Cardiologist: Glenetta Hew, MD Electrophysiologist: None  Clinic Note: Chief Complaint  Patient presents with   Follow-up    Patient states that she is feeling ok today. Meds reviewed with patient.    Coronary Artery Disease    Had recent non-STEMI treated with medical therapy.  No ischemic evaluation.  No angina.   Congestive Heart Failure    Low EF on echo, had 1 time when she did take extra Lasix, otherwise doing well.   ===================================  ASSESSMENT/PLAN   Problem List Items Addressed This Visit       Cardiology Problems   HFrEF (heart failure with reduced ejection fraction); CHRONIC COMBINED SYSTOLIC AND DIASTOLIC HEART FAILURE - Primary (Chronic)    Doing pretty well.  1 episode where she take extra Lasix.  They are worried about running out of Lasix, we can adjust the prescription to make sure that she does not run out.  Blood pressure is high today so we can further titrate her medications.  If still elevated, I may be of switch from losartan to Entresto at next visit.  Plan:  Increase losartan to 50 mg daily, continue current dose of Toprol to avoid bradycardia Change Lasix prescription to 1 to 2 tablets daily, as directed -> Disp 180 tab ROV ~2-3 months with APP - with plan to: Convert Losartan to Entresto if BP remains significantly elevated, vs. Simply further titration of ARB If ARB used - would add low dose Spironolactone 12.5 mg.       Relevant Medications   furosemide (LASIX) 20 MG tablet   losartan (COZAAR) 50 MG tablet   Other Relevant Orders   EKG 12-Lead   NSTEMI (non-ST elevated myocardial infarction) (HCC) (Chronic)   Relevant Medications   furosemide (LASIX) 20 MG tablet   losartan (COZAAR) 50 MG tablet   Other Relevant Orders   EKG 12-Lead   Hyperlipidemia, mixed (Chronic)    She has not had lipids checked in quite some time.  She did have elevated LP(a), but  I cannot see using PCSK9 inhibitors in a 88 year old.  She remains on atorvastatin 40 mg daily.  I would like to see some improvement in lipids. Will try deceiving get checked potentially @ or prior to next f/u visit with APP.       Relevant Medications   furosemide (LASIX) 20 MG tablet   losartan (COZAAR) 50 MG tablet   Essential (primary) hypertension (Chronic)    Blood pressure remains high.  I am sort of okay with it not being low.  I would prefer her pressures to be in the 130s to 160s and not lower.  Will therefore further titrate up ARB with plans to potentially convert to [facility] at next visit. If not converting to [facility], would potentially add spironolactone 12.5 mg daily and further titrate ARB.  Will make gradual changes. She should labs checked at next visit either at the visit or following the visit with fasting lipid panel checked as well.      Relevant Medications   furosemide (LASIX) 20 MG tablet   losartan (COZAAR) 50 MG tablet   Coronary artery disease involving native coronary artery without angina pectoris (Chronic)    Presented with a non-STEMI, elevated troponin.  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  Based on initial discussion with the patient in the hospital and then again in the  clinic last time we decided to forego stress test or coronary CTA which would potentially do heart catheterization because she would not be assisted in that.  Plan: Continue aspirin, beta-blocker and ARB along with statin. By convention, lipid diagnosis of non-STEMI in the hospital, would have considered DAPT for short period of time, however this was not done.  Bleeding concerns.  As long as she is not having any additional symptoms, I think we are okay with aspirin monotherapy.      Relevant Medications   furosemide (LASIX) 20 MG tablet   losartan (COZAAR) 50 MG tablet    ===================================  HPI:    Julia Salazar is a 88 y.o. female with recent Dx of ICM (EF 30~35%) after late presenting NSTEMI (Med Rx) - HFrEF Class II, HTN, HyperTG, GERD, and arthritis who is being seen today for [place] at the request of Julia Sons, MD.   08/09/2022-08/12/2022 [facility] admission): Non-STEMI, Med Rx (48 hr IV Hepari); Echo EF 30-35%. => d/c on Atorvastatin/Lovaza along with Toprol & Losartan 12.5 mg.  Presenting symptoms were incessant calling vulvar chest pain that was worse with coughing.  Has had coughing off and on for several months.  Troponin ~2000.  => Seen by Dr. Garen Lah in Cardiology Consultation:  Rec: With discussion between patient family and daughter, Early Conservative Management: IV Heparin x 48 hr, IN NTG & PRN morphine for pain. No plans for interventional procedures.- Nid not tolerate.  [name] somehow added to my schedule, was seen on 09/15/2022: She was accompanied by her daughter who corroborated most of her story.  Much like it was when she saw her PCP 1 month prior to this visit and that she was doing well.  Excellent compliance with therapy.  No further chest pain.  Not able to swallow Lovaza so she stopped it.  HCTZ had been discontinued and there was some swelling noted.  She noted doing well since discharge.  Limited walking due to significant knee pain.  Using a walker around the house.  She sleeps with head of bed level elevated sometimes and currently not dyspnea.  No PND orthopnea.  Trivial edema.  No longer noting the significant dyspnea going to the bathroom to some minimal exertion which is probably more related to knee pain.  Normal chest pain.  Only 1 occasion which took NTG.  Plan:  Increase both Toprol and losartan to 25 mg daily. Stressed the use of Lasix standing dose with PRN dosing for weight gain.. Continue to titrate heart failure medications as best tolerated for her age.  Could potentially switch  to [facility].   We will also potentially consider SGLT2 inhibitor or adding spironolactone if additional blood pressure is required  Recent Hospitalizations:  None  Reviewed  CV studies:    The following studies were reviewed today: (if available, images/films reviewed: From Epic Chart or Care Everywhere)   Interval History:   Julia Salazar presents here today for cardiology follow-up accompanied by both her son and daughter.  (2 of her 3 children who are always present to assist her, along with several grandchildren and great-grandchildren.).    She does she is doing well with no major issues since her last visit.  There was 1 time where she had some more than usual swelling a little more dyspnea and according to our recommendations, she took extra Lasix-40 mg in the a.m., 20 mg p.m. for 2 days and then 20 mg twice a day for 2  days with quick resolution of the swelling and dyspnea.  She has not had to use any nitroglycerin.  She states that really her knee arthritis pain limits her walking more so than dyspnea does, and she has not had any chest pain.  She does get short of breath walk around the house and uses her walker for short distance transit.  She uses a wheelchair for further distances the same time when with her family.  She sleeps well through the night without any PND, or orthopnea. No arrhythmia symptoms.  No syncope or near syncope.  No TIA or emesis fugax or claudication.  CV Review of Symptoms (Summary): positive for - dyspnea on exertion, edema, and shortness of breath negative for - chest pain, irregular heartbeat, loss of consciousness, orthopnea, palpitations, paroxysmal nocturnal dyspnea, rapid heart rate, shortness of breath, or syncope/near syncope or TIA/amaurosis fugax, claudication ; melena, hematochezia, hematuria or epistaxis.  REVIEWED OF SYSTEMS   Review of Systems  Constitutional:  Positive for malaise/fatigue (Does not have a lot of energy, but that is no  change from a year ago.). Negative for weight loss.  HENT:  Negative for congestion and nosebleeds.   Respiratory:  Positive for shortness of breath (On exertion, but exacerbated by pain).   Gastrointestinal:  Negative for blood in stool and melena.  Genitourinary:  Negative for dysuria.  Musculoskeletal:  Positive for back pain, joint pain and myalgias. Negative for falls.  Neurological:  Positive for dizziness (Sometimes with sitting up quickly). Negative for weakness.  Endo/Heme/Allergies:  Does not bruise/bleed easily.  Psychiatric/Behavioral:  Positive for memory loss (Minor short-term). Negative for depression. The patient is not nervous/anxious and does not have insomnia.     I have reviewed and (if needed) personally updated the patient's problem list, medications, allergies, past medical and surgical history, social and family history.   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  Moderate-severe LV dysfunction-severe HK of mid-apical lateral wall, septal wall and apical wall.  GR 1 DD.  Mild to moderate MR.  AOV calcification/sclerosis  but no stenosis.  Normal RV size and function mildly increased thickness.  Normal RAP. CTA Chest/ABD/Pel 08/09/2022: Small volume hiatal hernia.  No acute intrapulmonary findings.  Severe aortic plaque noted throughout the thoracic aorta.  Three-vessel coronary artery calcification noted.   Immunization History  Administered Date(s) Administered   Fluad Quad(high Dose 65+) 08/21/2019, 08/19/2022   Influenza Split 08/09/2011   Influenza, High Dose Seasonal PF 08/24/2015, 09/21/2016,  09/27/2018, 09/20/2021   Influenza-Unspecified 09/25/2017, 09/04/2020   Moderna Sars-Covid-2 Vaccination 12/18/2019, 01/15/2020   PFIZER Comirnaty(Gray Top)Covid-19 Tri-Sucrose Vaccine 04/23/2021   PFIZER(Purple Top)SARS-COV-2 Vaccination 10/22/2020   Pfizer Covid-19 Vaccine Bivalent Booster 16yr & up 09/20/2021   Pneumococcal Conjugate-13 11/03/2014   Pneumococcal Polysaccharide-23 10/17/2012   Td 11/03/2014   Zoster, Live 11/07/2013    MEDICATIONS/ALLERGIES   Current Meds  Medication Sig   losartan (COZAAR) 50 MG tablet Take 1 tablet (50 mg total) by mouth daily.   OMEPRAZOLE PO Take 1 tablet by mouth daily.     Allergies  Allergen Reactions   Lovastatin    Sertraline Hcl    Etodolac Rash    SOCIAL HISTORY/FAMILY HISTORY   Reviewed in Epic:   Social History   Tobacco Use   Smoking status: Never   Smokeless tobacco: Never  Vaping Use   Vaping Use: Never used  Substance Use Topics   Alcohol use: No    Alcohol/week: 0.0 standard drinks of alcohol   Drug use: No   Social History   Social History Narrative   Not on file   Family History  Problem Relation Age of Onset   Colon cancer Mother    Heart attack Father    Breast cancer Sister [phone]  Leukemia Sister    Hodgkin's lymphoma Sister     OBJCTIVE -PE, EKG, labs   Wt Readings from Last 3 Encounters:  11/17/22 104 lb 9.6 oz (47.4 kg)  10/12/22 110 lb (49.9 kg)  09/15/22 116 lb (52.6 kg)    Physical Exam: BP (!) 176/87 (BP  Location: Left Arm, Patient Position: Sitting, Cuff Size: Normal)   Pulse 77   Ht '4\' 11"'$  (1.499 m)   Wt 104 lb 9.6 oz (47.4 kg)   SpO2 95%   BMI 21.13 kg/m -BP recheck 166/80 mmHg. Physical Exam Vitals reviewed.  Constitutional:      General: She is not in acute distress.    Appearance: Normal appearance. She is normal weight. She is not ill-appearing (Well-nourished, well-groomed.) or toxic-appearing.  HENT:     Head: Normocephalic and atraumatic.     Ears:     Comments: Hard of hearing Eyes:     Comments: Blind in left eye.  Neck:     Vascular: No carotid bruit or JVD.  Cardiovascular:     Rate and Rhythm: Normal rate and regular rhythm. Occasional Extrasystoles are present.    Chest Wall: PMI is not displaced.     Pulses: Decreased pulses (Diminished, palpable.  Warm feet.).     Heart sounds: Heart sounds are distant. Murmur heard.     High-pitched harsh crescendo-decrescendo midsystolic murmur is present with a grade of 2/6 at the upper right sternal border radiating to the neck.     No friction rub. No gallop.     Comments: Normal S1, cannot exclude split S2. Pulmonary:     Effort: Pulmonary effort is normal. No respiratory distress.     Breath sounds: Normal breath sounds. No wheezing, rhonchi or rales.  Musculoskeletal:        General: No swelling (Trivial). Normal range of motion.     Cervical back: Normal range of motion and neck supple.  Skin:    General: Skin is warm and dry.     Coloration: Skin is not jaundiced or pale.  Neurological:     General: No focal deficit present.     Mental Status: She is alert and oriented to person, place, and  time.     Motor: No weakness.     Gait: Gait abnormal.  Psychiatric:        Mood and Affect: Mood normal.        Behavior: Behavior normal.        Thought Content: Thought content normal.        Judgment: Judgment normal.     Comments: Seems pretty mentally sharp, does have a little bit of issues with short-term memory but  no issues with long-term memory.     Adult ECG Report  Rate: 77;  Rhythm: normal sinus rhythm, premature atrial contractions (PAC), and Left Axis Deviation / LVH w/  IVCD/BBB.  Cannot exclude anterior MI, age-indeterminate. ;   Narrative Interpretation: Stable  Recent Labs: Reviewed LP(a) 274.  Interestingly no lipid panel checked. Lab Results  Component Value Date   CHOL 187 10/07/2020   HDL 35 (L) 10/07/2020   LDLCALC 111 (H) 10/07/2020   TRIG 238 (H) 10/07/2020   CHOLHDL 5.3 (H) 10/07/2020   Lab Results  Component Value Date   CREATININE 1.04 (H) 08/23/2022   BUN 17 08/23/2022   NA 140 08/23/2022   K 4.1 08/23/2022   CL 103 08/23/2022   CO2 21 08/23/2022      Latest Ref Rng & Units 08/23/2022   10:52 AM 08/12/2022    5:29 AM 08/11/2022    3:34 AM  CBC  WBC 3.4 - 10.8 x10E3/uL 8.0  8.3  13.3   Hemoglobin 11.1 - 15.9 g/dL 10.8  10.4  11.5   Hematocrit 34.0 - 46.6 % 32.4  30.8  34.1   Platelets 150 - 450 x10E3/uL 289  237  299     No results found for: "HGBA1C" Lab Results  Component Value Date   TSH 4.950 (H) 06/22/2022    ================================================== I spent a total of 25 minutes with the patient spent in direct patient consultation.  Additional time spent with chart review  / charting (studies, outside notes, etc): 23 min Total Time: 48 min  Current medicines are reviewed at length with the patient today.  (+/- concerns) reviewed  Notice: This dictation was prepared with Dragon dictation along with smart phrase technology. Any transcriptional errors that result from this process are unintentional and may not be corrected upon review.   Studies Ordered:  Orders Placed This Encounter  Procedures   EKG 12-Lead   Meds ordered this encounter  Medications   furosemide (LASIX) 20 MG tablet    Sig: Take 1 tablet (20 mg total) by mouth as directed. Take 40 mg in the morning if you weight increases by 3 lbs until back to baseline weight. If your  weight increases by 5 lbs, take 40 mg in the morning and 20 mg in the afternoon until back to baseline weight    Dispense:  180 tablet    Refill:  3    09/15/2022 change in directions   losartan (COZAAR) 50 MG tablet    Sig: Take 1 tablet (50 mg total) by mouth daily.    Dispense:  90 tablet    Refill:  3    Patient Instructions / Medication Changes & Studies & Tests Ordered   Patient Instructions  Medication Instructions:  Your physician has recommended you make the following change in your medication:   INCREASE Losartan to 50 mg once daily  Continue Furosemide as directed.    *If you need a refill on your cardiac medications before your next appointment, please call  your pharmacy*   Lab Work: None  If you have labs (blood work) drawn today and your tests are completely normal, you will receive your results only by: [place] (if you have MyChart) OR A paper copy in the mail If you have any lab test that is abnormal or we need to change your treatment, we will call you to review the results.   Testing/Procedures: None   Follow-Up: At [facility], you and your health needs are our priority.  As part of our continuing mission to provide you with exceptional heart care, we have created designated Provider Care Teams.  These Care Teams include your primary Cardiologist (physician) and Advanced Practice Providers (APPs -  Physician Assistants and Nurse Practitioners) who all work together to provide you with the care you need, when you need it.   Your next appointment:   3 month(s)  The format for your next appointment:   In Person  Provider:   You will see one of the following Advanced Practice Providers on your designated Care Team:   Murray Hodgkins, NP Christell Faith, PA-C Cadence Kathlen Mody, PA-C Gerrie Nordmann, NP     Important Information About [place]          Leonie Man, MD, MS Glenetta Hew, M.D., M.S. Interventional Cardiologist   [facility]   [address] [place], [place]  [address] [phone]           Fax [phone]    Thank you for choosing [facility] in [place]!!

## 2022-12-07 ENCOUNTER — Other Ambulatory Visit: Payer: Self-pay | Admitting: Family Medicine

## 2022-12-07 DIAGNOSIS — M47816 Spondylosis without myelopathy or radiculopathy, lumbar region: Secondary | ICD-10-CM

## 2022-12-07 DIAGNOSIS — G8929 Other chronic pain: Secondary | ICD-10-CM

## 2022-12-07 MED ORDER — FENTANYL 50 MCG/HR TD PT72: 1.0000 | MEDICATED_PATCH | TRANSDERMAL | 0 refills | Status: DC

## 2022-12-12 ENCOUNTER — Other Ambulatory Visit: Payer: Self-pay | Admitting: Cardiology

## 2022-12-20 ENCOUNTER — Encounter: Payer: Self-pay | Admitting: Family Medicine

## 2022-12-22 MED ORDER — CARISOPRODOL 350 MG PO TABS: 350.0000 mg | ORAL_TABLET | Freq: Four times a day (QID) | ORAL | 1 refills | Status: DC | PRN

## 2022-12-28 ENCOUNTER — Other Ambulatory Visit: Payer: Self-pay | Admitting: Family Medicine

## 2022-12-28 DIAGNOSIS — E782 Mixed hyperlipidemia: Secondary | ICD-10-CM

## 2023-01-01 ENCOUNTER — Other Ambulatory Visit: Payer: Self-pay | Admitting: Family Medicine

## 2023-01-01 DIAGNOSIS — R32 Unspecified urinary incontinence: Secondary | ICD-10-CM

## 2023-01-02 NOTE — Telephone Encounter (Signed)
Rx 09/05/22 #90 1RF Requested Prescriptions  Pending Prescriptions Disp Refills   oxybutynin (DITROPAN-XL) 5 MG 24 hr tablet [Pharmacy Med Name: OXYBUTYNIN CL ER 5 MG TABLET] 90 tablet 1    Sig: TAKE 1 TABLET BY MOUTH EVERYDAY AT BEDTIME     Urology:  Bladder Agents Passed - 01/01/2023 11:31 AM      Passed - Valid encounter within last 12 months    Recent Outpatient Visits           2 months ago Lumbar spondylosis   Graham, MD   3 months ago [place] [name], PA-C   4 months ago NSTEMI (non-ST elevated myocardial infarction) [facility])   [place], Donald E, MD   4 months ago Acute cough   [place], Donald E, MD   5 months ago Osteoarthritis of left knee, unspecified osteoarthritis type   [facility] Birdie Sons, MD       Future Appointments             In 1 week Fisher, Kirstie Peri, MD [facility], [place]   In 1 month Dunn, Areta Haber, PA-C [place] at [facility]

## 2023-01-10 NOTE — Progress Notes (Unsigned)
[place] Ponder [place],acting as a scribe for Lelon Huh, MD.,have documented all relevant documentation on the behalf of Lelon Huh, MD,as directed by  Lelon Huh, MD while in the presence of Lelon Huh, MD.     Established patient visit   Patient: Julia Salazar   DOB: [birth year]   89 y.o. Female  MRN: [id number] Visit Date: 01/11/2023  Today's healthcare provider: Lelon Huh, MD   No chief complaint on file.  Subjective    HPI  ***  Medications: Outpatient Medications Prior to Visit  Medication Sig   aspirin 81 MG tablet Take 81 mg by mouth daily.   atorvastatin (LIPITOR) 40 MG tablet Take 1 tablet (40 mg total) by mouth daily.   brimonidine-timolol (COMBIGAN) 0.2-0.5 % ophthalmic solution Place 1 drop into both eyes every 12 (twelve) hours.   Calcium Carb-Cholecalciferol 600-200 MG-UNIT TABS Take 1 tablet by mouth daily.   carisoprodol (SOMA) 350 MG tablet Take 1 tablet (350 mg total) by mouth every 6 (six) hours as needed for muscle spasms.   cholestyramine light (PREVALITE) 4 GM/DOSE powder MIX 1 GRAMS WITH FLUIDS AND DRINK DAILY OR AS NEEDED FOR LOOSE BOWELS   diclofenac Sodium (VOLTAREN) 1 % GEL Apply topically 4 (four) times daily.   diphenhydrAMINE (BENADRYL) 25 mg capsule Take 25 mg by mouth at bedtime.    diphenoxylate-atropine (LOMOTIL) 2.5-0.025 MG tablet Take 1 tablet by mouth 4 (four) times daily as needed.   fentaNYL (DURAGESIC) 50 MCG/HR Place 1 patch onto the skin every 3 (three) days.   FLUoxetine (PROZAC) 40 MG capsule Take 1 capsule (40 mg total) by mouth daily.   furosemide (LASIX) 20 MG tablet Take 1 tablet (20 mg total) by mouth as directed. Take 40 mg in the morning if you weight increases by 3 lbs until back to baseline weight. If your weight increases by 5 lbs, take 40 mg in the morning and 20 mg in the afternoon until back to baseline weight   HYDROcodone-acetaminophen (NORCO) 7.5-325 MG tablet Take 1 tablet by mouth every 6 (six) hours as  needed for moderate pain. Take 3 tab daily PRN   hyoscyamine (ANASPAZ) 0.125 MG TBDP disintergrating tablet Take 2 tablets (0.25 mg total) by mouth every 6 (six) hours as needed (excess oral secretions).   losartan (COZAAR) 50 MG tablet Take 1 tablet (50 mg total) by mouth daily.   meloxicam (MOBIC) 15 MG tablet TAKE 1 TABLET BY MOUTH DAILY AS NEEDED.   metoprolol succinate (TOPROL-XL) 25 MG 24 hr tablet Take 1 tablet (25 mg total) by mouth every evening.   Multiple Vitamins-Minerals (MULTIVITAMIN ADULT PO) Take 1 tablet by mouth daily.   nitroGLYCERIN (NITROSTAT) 0.4 MG SL tablet Place 1 tablet (0.4 mg total) under the tongue every 5 (five) minutes x 3 doses as needed for chest pain.   OMEPRAZOLE PO Take 1 tablet by mouth daily.   oxybutynin (DITROPAN-XL) 5 MG 24 hr tablet TAKE 1 TABLET BY MOUTH EVERYDAY AT BEDTIME   pantoprazole (PROTONIX) 40 MG tablet Take 1 tablet (40 mg total) by mouth daily. (Patient not taking: Reported on 11/17/2022)   No facility-administered medications prior to visit.    Review of Systems  {Labs  Heme  Chem  Endocrine  Serology  Results Review (optional):23779}   Objective    There were no vitals taken for this visit. {Show previous vital signs (optional):23777}  Physical Exam  ***  No results found for any visits on 01/11/23.  Assessment & Plan     ***  No follow-ups on file.      {provider attestation***:1}   Lelon Huh, MD  [place] [phone] (phone) [phone] (fax)  [place]

## 2023-01-11 ENCOUNTER — Telehealth: Payer: Self-pay | Admitting: Family Medicine

## 2023-01-11 ENCOUNTER — Ambulatory Visit (INDEPENDENT_AMBULATORY_CARE_PROVIDER_SITE_OTHER): Payer: Medicare Other | Admitting: Family Medicine

## 2023-01-11 VITALS — BP 182/78 | HR 64 | Temp 98.4°F | Wt 100.0 lb

## 2023-01-11 DIAGNOSIS — U071 COVID-19: Secondary | ICD-10-CM | POA: Diagnosis not present

## 2023-01-11 DIAGNOSIS — G8929 Other chronic pain: Secondary | ICD-10-CM

## 2023-01-11 DIAGNOSIS — M47816 Spondylosis without myelopathy or radiculopathy, lumbar region: Secondary | ICD-10-CM

## 2023-01-11 DIAGNOSIS — M545 Low back pain, unspecified: Secondary | ICD-10-CM

## 2023-01-11 DIAGNOSIS — M1712 Unilateral primary osteoarthritis, left knee: Secondary | ICD-10-CM

## 2023-01-11 MED ORDER — MOLNUPIRAVIR EUA 200MG CAPSULE: 4.0000 | ORAL_CAPSULE | Freq: Two times a day (BID) | ORAL | 0 refills | Status: AC

## 2023-01-11 MED ORDER — HYDROCODONE-ACETAMINOPHEN 7.5-325 MG PO TABS: 1.0000 | ORAL_TABLET | Freq: Four times a day (QID) | ORAL | 0 refills | Status: DC | PRN

## 2023-01-11 MED ORDER — FENTANYL 75 MCG/HR TD PT72: 1.0000 | MEDICATED_PATCH | TRANSDERMAL | 0 refills | Status: DC

## 2023-01-11 NOTE — Telephone Encounter (Signed)
Received a fax from covermymeds for fentanyl 109mg/hr 72 hr patches  Key: [id number]

## 2023-01-12 ENCOUNTER — Encounter: Payer: Self-pay | Admitting: Family Medicine

## 2023-01-13 ENCOUNTER — Telehealth: Payer: Self-pay

## 2023-01-13 NOTE — Telephone Encounter (Signed)
Copied from [place]. Topic: General - Other >> Jan 13, 2023  1:25 PM Everette C wrote: Reason for CRM: The patient's daughter has called to follow up on the status of the prior authorization for the patient's fentaNYL ([place]) 75 MCG/HR [id number] patch   If the prior authorization is unable to go through, the patient's daughter would like for the prescription to be permitted to be filled by the pharmacy without insurance applied if the prior auth has not been completed yet   Please contact further when possible

## 2023-01-13 NOTE — Telephone Encounter (Signed)
Patient's daughter Roselyn Reef is calling in checking on the status of the authorization. Patient's daughter Roselyn Reef called in because she needs Dr. Caryn Section to authorize the fentanyl patches to be filled without insurance. Says that the pharmacy won't fill it until they receive the authorization. Patient's daughter says the patch needed to be changed yesterday and patient cannot wait until Monday for the patch.

## 2023-01-16 NOTE — Telephone Encounter (Signed)
Advised 

## 2023-01-16 NOTE — Telephone Encounter (Signed)
PA was approved on 2/10, does she still want to pay out of pocket?

## 2023-01-27 ENCOUNTER — Other Ambulatory Visit: Payer: Self-pay | Admitting: Cardiology

## 2023-01-29 NOTE — Telephone Encounter (Signed)
Yes - Toprol 25 mg

## 2023-01-30 ENCOUNTER — Other Ambulatory Visit: Payer: Self-pay | Admitting: Cardiology

## 2023-01-30 ENCOUNTER — Telehealth: Payer: Self-pay | Admitting: Family Medicine

## 2023-01-30 ENCOUNTER — Encounter: Payer: Self-pay | Admitting: Cardiology

## 2023-01-30 MED ORDER — METOPROLOL SUCCINATE ER 25 MG PO TB24: 25.0000 mg | ORAL_TABLET | Freq: Every evening | ORAL | 3 refills | Status: DC

## 2023-01-30 NOTE — Telephone Encounter (Signed)
Contacted Julia Salazar to schedule their annual wellness visit. Appointment made for 03/29/2023.  [place] Direct Dial: [phone]

## 2023-02-03 ENCOUNTER — Ambulatory Visit: Payer: Self-pay | Admitting: *Deleted

## 2023-02-03 MED ORDER — CEPHALEXIN 500 MG PO CAPS: 500.0000 mg | ORAL_CAPSULE | Freq: Two times a day (BID) | ORAL | 0 refills | Status: AC

## 2023-02-03 NOTE — Telephone Encounter (Signed)
Have sent prescription for cephalexin to [facility], go to ER if gets any worse of if not much better in a day or 2.

## 2023-02-03 NOTE — Telephone Encounter (Signed)
Reason for Disposition  [1] Longstanding confusion (e.g., dementia, stroke) AND [2] worsening  Answer Assessment - Initial Assessment Questions 1. LEVEL OF CONSCIOUSNESS: "How is he (she, the patient) acting right now?" (e.g., alert-oriented, confused, lethargic, stuporous, comatose)     Confused, unable to walk, frequent , pain with urination 2. ONSET: "When did the confusion start?"  (minutes, hours, days)     Wednesday/Thursday night 3. PATTERN "Does this come and go, or has it been constant since it started?"  "Is it present now?"     constant 4. ALCOHOL or DRUGS: "Has he been drinking alcohol or taking any drugs?"      no 5. NARCOTIC MEDICINES: "Has he been receiving any narcotic medications?" (e.g., morphine, Vicodin)     -patient does have fentanyl patch, Vicodin Rx once/twice daily 6. CAUSE: "What do you think is causing the confusion?"      UTI 7. OTHER SYMPTOMS: "Are there any other symptoms?" (e.g., difficulty breathing, headache, fever, weakness)     Unable to walk,frequency and painful urination  Protocols used: Confusion - Delirium-A-AH

## 2023-02-03 NOTE — Telephone Encounter (Signed)
Patient daughter:Julia Salazar calling Chief Complaint: increased confusion- crying, urinary frequency and pain, increased weakness Symptoms: see above Frequency: symptoms started Wednesday- increasing worse today Pertinent Negatives: Patient denies difficulty breathing, headache, fever Disposition: '[]'$ ED /'[]'$ Urgent Care (no appt availability in office) / '[]'$ Appointment(In office/virtual)/ '[]'$  [facility]/ '[]'$ Home Care/ '[x]'$ Refused Recommended Disposition /'[]'$ [facility]/ '[]'$  Follow-up with PCP Additional Notes: Patient's daughter is calling- concerned that patient has UTI- she is requesting treatment- she states they can not get mother to go back to ED- last visit there really disoriented her and it took weeks to get her back to normal. Patient has normal O2 sat, no fever, can squeeze fingers with same strength and no facial distortion. Advised per protocol- patient needs to be seen- but the daughters are wanting to have her treated for UTI first- I will send message to PCP to see if they can possibly do a virtual visit.

## 2023-02-03 NOTE — Addendum Note (Signed)
Addended by: Lelon Huh E on: 02/03/2023 05:00 PM   Modules accepted: Orders

## 2023-02-09 ENCOUNTER — Other Ambulatory Visit: Payer: Self-pay | Admitting: Family Medicine

## 2023-02-09 ENCOUNTER — Encounter: Payer: Self-pay | Admitting: Family Medicine

## 2023-02-09 MED ORDER — ATORVASTATIN CALCIUM 40 MG PO TABS: 40.0000 mg | ORAL_TABLET | Freq: Every day | ORAL | 0 refills | Status: DC

## 2023-02-13 ENCOUNTER — Encounter: Payer: Self-pay | Admitting: Family Medicine

## 2023-02-13 DIAGNOSIS — R451 Restlessness and agitation: Secondary | ICD-10-CM

## 2023-02-14 ENCOUNTER — Other Ambulatory Visit: Payer: Self-pay | Admitting: Family Medicine

## 2023-02-14 DIAGNOSIS — G8929 Other chronic pain: Secondary | ICD-10-CM

## 2023-02-14 DIAGNOSIS — M47816 Spondylosis without myelopathy or radiculopathy, lumbar region: Secondary | ICD-10-CM

## 2023-02-14 DIAGNOSIS — M1712 Unilateral primary osteoarthritis, left knee: Secondary | ICD-10-CM

## 2023-02-16 ENCOUNTER — Ambulatory Visit: Payer: Medicare Other | Admitting: Physician Assistant

## 2023-02-16 MED ORDER — LORAZEPAM 0.5 MG PO TABS: 0.5000 mg | ORAL_TABLET | Freq: Two times a day (BID) | ORAL | 1 refills | Status: DC | PRN

## 2023-02-16 MED ORDER — FENTANYL 75 MCG/HR TD PT72: 1.0000 | MEDICATED_PATCH | TRANSDERMAL | 0 refills | Status: DC

## 2023-03-01 ENCOUNTER — Other Ambulatory Visit: Payer: Self-pay | Admitting: Family Medicine

## 2023-03-01 DIAGNOSIS — R32 Unspecified urinary incontinence: Secondary | ICD-10-CM

## 2023-03-02 ENCOUNTER — Encounter: Payer: Self-pay | Admitting: Family Medicine

## 2023-03-02 DIAGNOSIS — R4189 Other symptoms and signs involving cognitive functions and awareness: Secondary | ICD-10-CM

## 2023-03-02 DIAGNOSIS — Z7409 Other reduced mobility: Secondary | ICD-10-CM

## 2023-03-02 DIAGNOSIS — I5020 Unspecified systolic (congestive) heart failure: Secondary | ICD-10-CM

## 2023-03-02 DIAGNOSIS — S32000S Wedge compression fracture of unspecified lumbar vertebra, sequela: Secondary | ICD-10-CM

## 2023-03-02 DIAGNOSIS — M545 Low back pain, unspecified: Secondary | ICD-10-CM

## 2023-03-06 ENCOUNTER — Telehealth: Payer: Self-pay

## 2023-03-06 ENCOUNTER — Other Ambulatory Visit: Payer: Medicare Other

## 2023-03-06 DIAGNOSIS — Z515 Encounter for palliative care: Secondary | ICD-10-CM

## 2023-03-06 NOTE — Telephone Encounter (Signed)
Palliative care outreach to patients daughter in regards to new [facility] referral.   Call unsuccessful. SW LVM for daughter, Narda Rutherford.

## 2023-03-06 NOTE — Progress Notes (Signed)
TELEPHONE ENCOUNTER  Palliative care outreach to daughter, Narda Rutherford,  to discuss new palliative care referral and criteria.   Daughter shared that her main concern for patient is her cognitive decline. Per daughter patient has had a functional and cognitive decline over the past month or so since she had a UTI. Patient is no longer able to leave the home to attend doctor's appointments. Patient is no longer ambulatory and requires assistance with ADL's and transfers. Patients 2 daughters are her primary caregivers whom provide around the clock care and supervision to patient.  Daughter desires more support in the home in the form of a medical provider, who can writ orders and dx medical concerns,  due to patient being unable to see PCP in the office. SW discussed the possibility of making a referral to [facility]. Patient has hospital bed, WC, RW, BSC and walk in shower.   Plan: PC to visit patient on 4/10 @9am . To assess patient based on new PC criteria and provide community resources if needed.

## 2023-03-15 ENCOUNTER — Other Ambulatory Visit: Payer: Medicare Other

## 2023-03-15 ENCOUNTER — Other Ambulatory Visit: Payer: Self-pay | Admitting: Family Medicine

## 2023-03-15 DIAGNOSIS — M545 Low back pain, unspecified: Secondary | ICD-10-CM

## 2023-03-15 DIAGNOSIS — M47816 Spondylosis without myelopathy or radiculopathy, lumbar region: Secondary | ICD-10-CM

## 2023-03-15 DIAGNOSIS — M1712 Unilateral primary osteoarthritis, left knee: Secondary | ICD-10-CM

## 2023-03-16 MED ORDER — FENTANYL 75 MCG/HR TD PT72: 1.0000 | MEDICATED_PATCH | TRANSDERMAL | 0 refills | Status: DC

## 2023-03-21 ENCOUNTER — Other Ambulatory Visit: Payer: Medicare Other

## 2023-03-21 VITALS — BP 130/80 | HR 63 | Resp 18

## 2023-03-21 DIAGNOSIS — Z515 Encounter for palliative care: Secondary | ICD-10-CM

## 2023-03-21 NOTE — Progress Notes (Signed)
[facility] PALLIATIVE CARE SW NOTE  PATIENT NAME: Julia Salazar DOB: [birth year] MRN: [id number]  PRIMARY CARE PROVIDER: Malva Limes, MD  RESPONSIBLE PARTY:  Acct ID - Guarantor Home Phone Work Phone Relationship Acct Type  [id number] Julia Salazar* [phone]  Self P/F     [address], [place], [place] [address]     PLAN OF CARE and INTERVENTIONS:              GOALS OF CARE/ ADVANCE CARE PLANNING:    Goals include to maximize quality of life and assist with pain management. Our advance care planning conversation included a discussion about:    The value and importance of advance care planning  Review and updating or creation of an advance directive document.                          Code Status: DNR          ACD: patient has HCPOA in place identifying her daughters as agents.                        GOC: Patient stay out of the hospital as mush as possible.Marland Kitchen  2.        SOCIAL/EMOTIONAL/SPIRITUAL ASSESSMENT/ INTERVENTIONS:         Palliative care encounter: SW  and RN, PJ, completed initial in home visit with patient and patients daughters, [name] and [name]. PC criteria reviewed and discussed.    Presenting problem: patient with confusion, chronic low back pain and osteoarthritis. Patient x with COVID in Feb 2024 and a UTI in march 2024.    Cognition: per family patient has become more confused over the past few months. Patient is no longer able to utilize her ipad, she is having more issues with using daily house hold tools like the remote or telephone. Family state that over the weekend, patients confusion was more severe and agitated with crying spells. Patient does not have dementia dx but exhibits s/s of the illness. Dementia care resource booklet left with family.  Mobility: patient is MAX A with all ADL's. Patient requires max A with transfers. Patient now   Appetite: Patient share her appetite is fair. Patient requires more finger foods. Patient also drinks supplements.  Patients fluid intake is fair, currently drinking Pedialyte as her water intake on  a daily is poor.   Psychosocial assessment: completed.   In home support: Patient lives independently. Daughters stay with her at night, patients son also comes by during the day. Referral made to [facility].  Transportation: no needs.   Food: no food insecurities witnessed.   Resources: none   Safety and long term planning: patient feels safe in her home and desires to remain in her home with family support.   SW discussed goals, reviewed care plan, provided emotional support, used active and reflective listening in the form of reciprocity emotional response. Questions and concerns were addressed. The patient was encouraged to call with any additional questions and/or concerns. PC Provided general support and encouragement, no other unmet needs identified.   PC will follow up in 2 weeks.   3.         PATIENT/CAREGIVER EDUCATION/ COPING:   Appearance: well groomed, appropriate given situation  Mental Status: Alert and oriented to self Eye Contact: good  Thought Process: rational  Thought Content: not assessed  Speech: good/clear but HOH Mood: Normal and calm Affect: Congruent to endorsed mood,  full ranging Insight: poor Judgement: poor Interaction Style: Cooperative   Patient A&O and able to make needs known. Patients cognition is poor and depends on children to fill in gaps of hx. PHQ9: 0. Patient denies S/S of anxiety and depression.   Social hx: patient is a retired Neurosurgeon. Patients deceased spouse was an [place] [employer] Investment banker, operational.   4.         PERSONAL EMERGENCY PLAN:  Patient/family will call 9-1-1 for emergencies.    5.         COMMUNITY RESOURCES COORDINATION/ HEALTH CARE NAVIGATION:  patients daughters manages her care.    6.      FINANCIAL CONCERNS/NEEDS: none                         Primary Health Insurance: [facility] medicare  Secondary Health Insurance: none Prescription  Coverage: Yes, no history of difficulty obtaining or affording prescriptions reported.     SOCIAL HX:  Social History   Tobacco Use   Smoking status: Never   Smokeless tobacco: Never  Substance Use Topics   Alcohol use: No    Alcohol/week: 0.0 standard drinks of alcohol    CODE STATUS: DNR  ADVANCED DIRECTIVES: Y MOST FORM COMPLETE: N HOSPICE EDUCATION PROVIDED: N  PPS: 30%  Time spent: 50 min       [place], [place]

## 2023-03-22 NOTE — Progress Notes (Signed)
[facility] Palliative Care Initial Encounter Note   PATIENT NAME: Julia Salazar DOB: 1929/01/20 MRN: [id number]  PRIMARY CARE PROVIDER: Malva Limes, MD  RESPONSIBLE PARTY:  Acct ID - Guarantor Home Phone Work Phone Relationship Acct Type  [id number] Julia Salazar, Julia Salazar* [phone]  Self P/F     [address], [place], [place] [address]    RN & Asia Perry,SW completed home visit. Daughters also present    HISTORY OF PRESENT ILLNESS:   89 y.o. female with medical history significant for HTN, chr, CAD, NSTEMI,GERD, arthritis, depression, diverticulosis, chicken pox, Descemet's stripping endothelial keratoplasty (DSEK), measles, mumps, hyperlipidemia, and ovarian cancer (1995)  Socially: Pt is a retired Horticulturist, commercial. Deceased husband was in [employer]. Lives in single story home. A family member is always with her. Cameras in home also. Member of  for 55 years.    Cognitive: Alert. Confused. Per daughters, pt has been having episodes where she becomes inconsolable. This past weekend, she wanted to go home. Did not recognize any of her things. She said she was in a past home she lived in with her parents. Pt used to use ipad a lot but has stopped since January.  Difficulty finding words noted.   RN / SW educated daughters regarding redirection and fidget/busy blankets. Dementia booklet given. Will discuss more on next visit.    Appetite: Not great. Eats about 2 meals per day. Will substitute ensure for meals at times. Pt drinks about 24 ounces of fluids per day. Pedialyte, cranberry juice, water, etc.   Mobility: unable to bear weight in the last six weeks. Before that she was using her rollator. Now she is moved in wheelchair. Pivot to total lift now.    Sleeping Pattern: Daughters report that she sleeps good approx. 10 hrs. Takes muscle relaxers to help sleep. Does take a daytime nap. However, the confusion is worse after daytime naps.  Pain: Pt wears fentanyl patch and has oral meds for  breakthru pain. Pt reports occasional back pain. Was getting knee injections.   GI/GU: Usually continent. Pt has IBS and takes prevalite "a little every day". Yesterday she was incontinent of diarrhea.   Palliative Care/ Hospice: RN explained role and purpose of palliative care including visit frequency. Also discussed benefits of hospice care as well as the differences between the two with patient.   Goals of Care: Not to go to hospital.     CODE STATUS: DNR ADVANCED DIRECTIVES: Yes HCPOA- Daughter [name] MOST FORM: No PPS:   Next appt scheduled: May 9      PHYSICAL EXAM:   VITALS: Today's Vitals   03/21/23 1123  BP: 130/80  Pulse: 63  Resp: 18  SpO2: 96%  PainSc: 0-No pain    LUNGS: clear bilat.  CARDIAC:  regular, no JVD EXTREMITIES: MAE x 4   Barbette Merino, RN

## 2023-03-29 ENCOUNTER — Ambulatory Visit (INDEPENDENT_AMBULATORY_CARE_PROVIDER_SITE_OTHER): Payer: Medicare Other

## 2023-03-29 VITALS — Ht 59.0 in | Wt 100.0 lb

## 2023-03-29 DIAGNOSIS — Z0000 Encounter for general adult medical examination without abnormal findings: Secondary | ICD-10-CM

## 2023-03-29 NOTE — Progress Notes (Signed)
I connected with  Julia Salazar on 03/29/23 by a audio enabled telemedicine application and verified that I am speaking with the correct person using two identifiers.  Patient Location: Home  Provider Location: Office/Clinic  I discussed the limitations of evaluation and management by telemedicine. The patient expressed understanding and agreed to proceed.  Subjective:   Julia Salazar is a 89 y.o. female who presents for Medicare Annual (Subsequent) preventive examination.  Review of Systems    Cardiac Risk Factors include: advanced age (>74men, >56 women);dyslipidemia;hypertension;sedentary lifestyle    Objective:    Today's Vitals   03/29/23 0847  Weight: 100 lb (45.4 kg)  Height: 4\' 11"  (1.499 m)   Body mass index is 20.2 kg/m.     08/10/2022    7:09 PM 08/10/2022    7:04 PM 02/28/2022   10:46 AM 02/22/2021    9:41 AM 02/18/2020   10:54 AM 02/12/2019    8:55 AM 02/08/2018    8:40 AM  Advanced Directives  Does Patient Have a Medical Advance Directive?  No No Yes Yes Yes Yes  Type of Theme park manager;Living will Healthcare Power of [place];Living will Healthcare Power of [place];Living will Healthcare Power of [place];Living will;Out of facility DNR (pink MOST or yellow form)  Copy of Healthcare Power of Attorney in Chart?    Yes - validated most recent copy scanned in chart (See row information) Yes - validated most recent copy scanned in chart (See row information) Yes - validated most recent copy scanned in chart (See row information) Yes  Would patient like information on creating a medical advance directive? No - Patient declined  No - Patient declined        Current Medications (verified) Outpatient Encounter Medications as of 03/29/2023  Medication Sig   aspirin 81 MG tablet Take 81 mg by mouth daily.   atorvastatin (LIPITOR) 40 MG tablet Take 1 tablet (40 mg total) by mouth daily.   brimonidine-timolol (COMBIGAN) 0.2-0.5 % ophthalmic  solution Place 1 drop into both eyes every 12 (twelve) hours.   Calcium Carb-Cholecalciferol 600-200 MG-UNIT TABS Take 1 tablet by mouth daily.   carisoprodol (SOMA) 350 MG tablet Take 1 tablet (350 mg total) by mouth every 6 (six) hours as needed for muscle spasms.   cholestyramine light (PREVALITE) 4 GM/DOSE powder MIX 1 GRAMS WITH FLUIDS AND DRINK DAILY OR AS NEEDED FOR LOOSE BOWELS   diclofenac Sodium (VOLTAREN) 1 % GEL Apply topically 4 (four) times daily.   diphenhydrAMINE (BENADRYL) 25 mg capsule Take 25 mg by mouth at bedtime.    diphenoxylate-atropine (LOMOTIL) 2.5-0.025 MG tablet Take 1 tablet by mouth 4 (four) times daily as needed.   fentaNYL (DURAGESIC) 75 MCG/HR Place 1 patch onto the skin every 3 (three) days.   FLUoxetine (PROZAC) 40 MG capsule Take 1 capsule (40 mg total) by mouth daily.   furosemide (LASIX) 20 MG tablet Take 1 tablet (20 mg total) by mouth as directed. Take 40 mg in the morning if you weight increases by 3 lbs until back to baseline weight. If your weight increases by 5 lbs, take 40 mg in the morning and 20 mg in the afternoon until back to baseline weight   HYDROcodone-acetaminophen (NORCO) 7.5-325 MG tablet Take 1 tablet by mouth every 6 (six) hours as needed for moderate pain. Take 3 tab daily PRN   hyoscyamine (ANASPAZ) 0.125 MG TBDP disintergrating tablet Take 2 tablets (0.25 mg total) by mouth every 6 (  six) hours as needed (excess oral secretions).   LORazepam (ATIVAN) 0.5 MG tablet Take 1 tablet (0.5 mg total) by mouth 2 (two) times daily as needed for anxiety.   meloxicam (MOBIC) 15 MG tablet TAKE 1 TABLET BY MOUTH DAILY AS NEEDED.   metoprolol succinate (TOPROL-XL) 25 MG 24 hr tablet Take 1 tablet (25 mg total) by mouth every evening.   Multiple Vitamins-Minerals (MULTIVITAMIN ADULT PO) Take 1 tablet by mouth daily.   nitroGLYCERIN (NITROSTAT) 0.4 MG SL tablet Place 1 tablet (0.4 mg total) under the tongue every 5 (five) minutes x 3 doses as needed for  chest pain.   OMEPRAZOLE PO Take 1 tablet by mouth daily.   oxybutynin (DITROPAN-XL) 5 MG 24 hr tablet TAKE 1 TABLET BY MOUTH EVERYDAY AT BEDTIME   pantoprazole (PROTONIX) 40 MG tablet Take 1 tablet (40 mg total) by mouth daily.   losartan (COZAAR) 50 MG tablet Take 1 tablet (50 mg total) by mouth daily.   No facility-administered encounter medications on file as of 03/29/2023.    Allergies (verified) Lovastatin, Sertraline hcl, and Etodolac   History: Past Medical History:  Diagnosis Date   Allergy    Anxiety    at times   Arthritis    Blood transfusion without reported diagnosis    Cataract    Removed both eyes   Depression    Diverticulosis    Glaucoma    History of chicken pox    History of Descemet's stripping endothelial keratoplasty (DSEK)    2019 and 2023   History of measles    History of mumps    Hyperlipidemia    Hypertension    Ovarian cancer 1995   Stroke    Past Surgical History:  Procedure Laterality Date   ABDOMINAL HYSTERECTOMY     1973   APPENDECTOMY  1986   BACK SURGERY  05/2012   done at [facility] hospital in [place]   BILATERAL SALPINGOOPHORECTOMY  1986   CATARACT EXTRACTION  2010   right eye   CHOLECYSTECTOMY  1998   COLON RESECTION  1995   COLON SURGERY     Colon resection   CORNEAL TRANSPLANT     DESCEMETS STRIPPING AUTOMATED ENDOTHELIAL KERATOPLASTY Left 08/30/2018   Marilu Favre, MD [facility]   EYE SURGERY     Cataracts, detached retina/buckle, glaucoma tube placement   OOPHORECTOMY     SPINE SURGERY     Spinal fusion   Family History  Problem Relation Age of Onset   Colon cancer Mother    Cancer Mother    Hypertension Mother    Heart attack Father    Heart disease Father    Hypertension Father    Breast cancer Sister 89   Cancer Sister    Leukemia Sister    Hodgkin's lymphoma Sister    Cancer Sister    Social History   Socioeconomic History   Marital status: Widowed    Spouse name: Not on file   Number of children: 3    Years of education: LPN   Highest education level: Associate degree: occupational, Scientist, product/process development, or vocational program  Occupational History   Occupation: retired  Tobacco Use   Smoking status: Never   Smokeless tobacco: Never  Vaping Use   Vaping Use: Never used  Substance and Sexual Activity   Alcohol use: No   Drug use: No   Sexual activity: Not Currently    Birth control/protection: Post-menopausal  Other Topics Concern   Not on file  Social History Narrative   Not on file   Social Determinants of Health   Financial Resource Strain: Low Risk  (03/29/2023)   Overall Financial Resource Strain (CARDIA)    Difficulty of Paying Living Expenses: Not hard at all  Food Insecurity: No Food Insecurity (03/29/2023)   Hunger Vital Sign    Worried About Running Out of Food in the Last Year: Never true    Ran Out of Food in the Last Year: Never true  Transportation Needs: No Transportation Needs (03/29/2023)   PRAPARE - Administrator, Civil Service (Medical): No    Lack of Transportation (Non-Medical): No  Physical Activity: Inactive (03/29/2023)   Exercise Vital Sign    Days of Exercise per Week: 0 days    Minutes of Exercise per Session: 0 min  Stress: Stress Concern Present (03/29/2023)   [employer] of Occupational Health - Occupational Stress Questionnaire    Feeling of Stress : Rather much  Social Connections: Unknown (03/29/2023)   Social Connection and Isolation Panel [NHANES]    Frequency of Communication with Friends and Family: Never    Frequency of Social Gatherings with Friends and Family: More than three times a week    Attends Religious Services: Not on file    Active Member of Clubs or Organizations: Yes    Attends Banker Meetings: Never    Marital Status: Widowed    Tobacco Counseling Counseling given: Not Answered   Clinical Intake:              How often do you need to have someone help you when you read instructions,  pamphlets, or other written materials from your doctor or pharmacy?: 5 - Always  Diabetic?no         Activities of Daily Living    03/29/2023    8:33 AM 01/11/2023   10:10 AM  In your present state of health, do you have any difficulty performing the following activities:  Hearing? 0 1  Vision? 1 1  Difficulty concentrating or making decisions? 1 1  Walking or climbing stairs? 1 1  Dressing or bathing? 1 1  Doing errands, shopping? 1 1  Preparing Food and eating ? Y   Using the Toilet? Y   In the past six months, have you accidently leaked urine? Y   Do you have problems with loss of bowel control? Y   Managing your Medications? Y   Managing your Finances? Y   Housekeeping or managing your Housekeeping? Y     Patient Care Team: Malva Limes, MD as PCP - General (Family Medicine) Marykay Lex, MD as PCP - Cardiology (Cardiology) Debbrah Alar, MD (Dermatology) Irene Limbo., MD as Consulting Physician (Ophthalmology) Buck Mam, DDS as Referring Physician (Dentistry) Deeann Saint, MD (Orthopedic Surgery)  Indicate any recent Medical Services you may have received from other than [facility] providers in the past year (date may be approximate).     Assessment:   This is a routine wellness examination for Jasmarie.  Hearing/Vision screen Hearing Screening - Comments:: Adequate hearing Vision Screening - Comments:: Adequate vision in rt eye w/glasses Lft eye cannot see Dr Alvester Morin  Dietary issues and exercise activities discussed: Current Exercise Habits: The patient does not participate in regular exercise at present, Exercise limited by: cardiac condition(s);neurologic condition(s);orthopedic condition(s)   Goals Addressed   None    Depression Screen    03/29/2023    8:54 AM 01/11/2023   10:10 AM  08/19/2022   11:02 AM 02/28/2022   10:44 AM 02/22/2021    9:46 AM 10/07/2020   10:07 AM 02/18/2020   10:50 AM  PHQ 2/9 Scores  PHQ - 2 Score 1 1 0 0 0 0 0  PHQ-  9 Score  2 1        Fall Risk    03/29/2023    8:33 AM 01/11/2023   10:10 AM 08/19/2022   11:02 AM 02/28/2022   10:47 AM 02/22/2021    9:42 AM  Fall Risk   Falls in the past year? 1 1 0 1 1  Number falls in past yr: 1 1 0 1 1  Injury with Fall? 0 1 0 0 0  Risk for fall due to :    History of fall(s) Impaired balance/gait  Follow up    Falls prevention discussed Falls prevention discussed  Comment     Currently in PT for balance issues.    FALL RISK PREVENTION PERTAINING TO THE HOME:  Any stairs in or around the home? No ramp for going out If so, are there any without handrails? No  Home free of loose throw rugs in walkways, pet beds, electrical cords, etc? Yes  Adequate lighting in your home to reduce risk of falls? Yes   ASSISTIVE DEVICES UTILIZED TO PREVENT FALLS:  Life alert? Yes  has one but does need because children with her all the time Use of a cane, walker or w/c? No  no more;can stand and pivot sometimes;w/c for transport Grab bars in the bathroom? Yes  Shower chair or bench in shower? Yes  Elevated toilet seat or a handicapped toilet? Yes  bedside commode also  Cognitive Function:    09/28/2022    3:20 PM  MMSE - Mini Mental State Exam  Orientation to time 2  Orientation to Place 5  Registration 3  Attention/ Calculation 5  Recall 2  Language- name 2 objects 2  Language- repeat 1  Language- follow 3 step command 3  Language- read & follow direction 1  Write a sentence 1  Copy design 1  Total score 26        03/29/2023    9:38 AM 02/08/2018    8:46 AM 10/05/2016    1:23 PM  6CIT Screen  What Year? 4 points 0 points 0 points  What month? 3 points 0 points 0 points  What time?  0 points 0 points  Count back from 20  0 points 0 points  Months in reverse  0 points 0 points  Repeat phrase  2 points 2 points  Total Score  2 points 2 points    Immunizations Immunization History  Administered Date(s) Administered   Fluad Quad(high Dose 65+) 08/21/2019,  08/19/2022   Influenza Split 08/09/2011   Influenza, High Dose Seasonal PF 08/24/2015, 09/21/2016, 09/27/2018, 09/20/2021   Influenza-Unspecified 09/25/2017, 09/04/2020   Moderna Sars-Covid-2 Vaccination 12/18/2019, 01/15/2020   PFIZER Comirnaty(Gray Top)Covid-19 Tri-Sucrose Vaccine 04/23/2021   PFIZER(Purple Top)SARS-COV-2 Vaccination 10/22/2020   Pfizer Covid-19 Vaccine Bivalent Booster 75yrs & up 09/20/2021   Pneumococcal Conjugate-13 11/03/2014   Pneumococcal Polysaccharide-23 10/17/2012   Td 11/03/2014   Zoster, Live 11/07/2013    TDAP status: Up to date  Flu Vaccine status: Up to date  Pneumococcal vaccine status: Up to date  Covid-19 vaccine status: Completed vaccines  Qualifies for Shingles Vaccine? Yes   Zostavax completed No   Shingrix Completed?: No.    Education has been provided regarding the importance  of this vaccine. Patient has been advised to call insurance company to determine out of pocket expense if they have not yet received this vaccine. Advised may also receive vaccine at local pharmacy or Health Dept. Verbalized acceptance and understanding.  Screening Tests Health Maintenance  Topic Date Due   Zoster Vaccines- Shingrix (1 of 2) Never done   DEXA SCAN  10/24/2017   COVID-19 Vaccine (6 - 2023-24 season) 08/05/2022   INFLUENZA VACCINE  07/06/2023   DTaP/Tdap/Td (2 - Tdap) 11/03/2024   Pneumonia Vaccine 66+ Years old  Completed   HPV VACCINES  Aged Out    Health Maintenance  Health Maintenance Due  Topic Date Due   Zoster Vaccines- Shingrix (1 of 2) Never done   DEXA SCAN  10/24/2017   COVID-19 Vaccine (6 - 2023-24 season) 08/05/2022    Colorectal cancer screening: No longer required.   Mammogram status: No longer required due to age.  Bone Density status: Completed yes. Results reflect: Bone density results: OSTEOPENIA. Repeat every 5 years.  Lung Cancer Screening: (Low Dose CT Chest recommended if Age 3-80 years, 30 pack-year currently  smoking OR have quit w/in 15years.) does not qualify.   Lung Cancer Screening Referral: no  Additional Screening:  Hepatitis C Screening: does not qualify; Completed yes  Vision Screening: Recommended annual ophthalmology exams for early detection of glaucoma and other disorders of the eye. Is the patient up to date with their annual eye exam?  Yes  Who is the provider or what is the name of the office in which the patient attends annual eye exams? Dr Alvester Morin If pt is not established with a provider, would they like to be referred to a provider to establish care? No .   Dental Screening: Recommended annual dental exams for proper oral hygiene  Community Resource Referral / Chronic Care Management: CRR required this visit?  No   CCM required this visit?  No    Plan:     I have personally reviewed and noted the following in the patient's chart:   Medical and social history Use of alcohol, tobacco or illicit drugs  Current medications and supplements including opioid prescriptions. Patient is currently taking opioid prescriptions. Information provided to patient regarding non-opioid alternatives. Patient advised to discuss non-opioid treatment plan with their provider. Functional ability and status Nutritional status Physical activity Advanced directives List of other physicians Hospitalizations, surgeries, and ER visits in previous 12 months Vitals Screenings to include cognitive, depression, and falls Referrals and appointments  In addition, I have reviewed and discussed with patient certain preventive protocols, quality metrics, and best practice recommendations. A written personalized care plan for preventive services as well as general preventive health recommendations were provided to patient.     Sue Lush, LPN   1/61/0960   Nurse Notes: I spoke with mostly pt's daughter, [name] who answers most of the questions and is caretaker for her mom. She relays her mother  is having some new dementia problems and is declining. She relays that they no longer can take her outside the home for appts and have sought out [facility] for palliative care. The patient could not tell me her DOB or the year/month we are in. She/daughter relays she has nights she is up all night, crying spells and confusion. She has episodes of incontinence of urine and bowels. Janie relays she still eats two small meals, one ensure but cannot eat finger foots anymore. She still tries to use a spoon but needs  assistance. She also relays pt has not had a BM in 7days. They have been giving her miralax and will give enema tomorrow. She relays her sister is a CMA and does the hands-on care of her mom mostly. She does inquire if her mother could have a UTI causing confusion but says they are not able to come in office anymore. They are seeking resources to come to the house ([facility] and [facility]) to help determine cause of sudden mental changes and decline.

## 2023-03-29 NOTE — Patient Instructions (Signed)
Julia Salazar , Thank you for taking time to come for your Medicare Wellness Visit. I appreciate your ongoing commitment to your health goals. Please review the following plan we discussed and let me know if I can assist you in the future.   These are the goals we discussed:  Goals      DIET - EAT MORE FRUITS AND VEGETABLES     Increase water intake     Recommend to increase water intake to 6-8 8 oz glasses a day.        This is a list of the screening recommended for you and due dates:  Health Maintenance  Topic Date Due   Zoster (Shingles) Vaccine (1 of 2) Never done   DEXA scan (bone density measurement)  10/24/2017   COVID-19 Vaccine (6 - 2023-24 season) 08/05/2022   Flu Shot  07/06/2023   DTaP/Tdap/Td vaccine (2 - Tdap) 11/03/2024   Pneumonia Vaccine  Completed   HPV Vaccine  Aged Out    Advanced directives: yes  Conditions/risks identified: high falls risk  Next appointment: Follow up in one year for your annual wellness visit 04/02/2024 :15am telephone   Preventive Care 65 Years and Older, Female Preventive care refers to lifestyle choices and visits with your health care provider that can promote health and wellness. What does preventive care include? A yearly physical exam. This is also called an annual well check. Dental exams once or twice a year. Routine eye exams. Ask your health care provider how often you should have your eyes checked. Personal lifestyle choices, including: Daily care of your teeth and gums. Regular physical activity. Eating a healthy diet. Avoiding tobacco and drug use. Limiting alcohol use. Practicing safe sex. Taking low-dose aspirin every day. Taking vitamin and mineral supplements as recommended by your health care provider. What happens during an annual well check? The services and screenings done by your health care provider during your annual well check will depend on your age, overall health, lifestyle risk factors, and family  history of disease. Counseling  Your health care provider may ask you questions about your: Alcohol use. Tobacco use. Drug use. Emotional well-being. Home and relationship well-being. Sexual activity. Eating habits. History of falls. Memory and ability to understand (cognition). Work and work Astronomer. Reproductive health. Screening  You may have the following tests or measurements: Height, weight, and BMI. Blood pressure. Lipid and cholesterol levels. These may be checked every 5 years, or more frequently if you are over 35 years old. Skin check. Lung cancer screening. You may have this screening every year starting at age 42 if you have a 30-pack-year history of smoking and currently smoke or have quit within the past 15 years. Fecal occult blood test (FOBT) of the stool. You may have this test every year starting at age 78. Flexible sigmoidoscopy or colonoscopy. You may have a sigmoidoscopy every 5 years or a colonoscopy every 10 years starting at age 57. Hepatitis C blood test. Hepatitis B blood test. Sexually transmitted disease (STD) testing. Diabetes screening. This is done by checking your blood sugar (glucose) after you have not eaten for a while (fasting). You may have this done every 1-3 years. Bone density scan. This is done to screen for osteoporosis. You may have this done starting at age 18. Mammogram. This may be done every 1-2 years. Talk to your health care provider about how often you should have regular mammograms. Talk with your health care provider about your test results, treatment options,  and if necessary, the need for more tests. Vaccines  Your health care provider may recommend certain vaccines, such as: Influenza vaccine. This is recommended every year. Tetanus, diphtheria, and acellular pertussis (Tdap, Td) vaccine. You may need a Td booster every 10 years. Zoster vaccine. You may need this after age 60. Pneumococcal 13-valent conjugate (PCV13)  vaccine. One dose is recommended after age 46. Pneumococcal polysaccharide (PPSV23) vaccine. One dose is recommended after age 105. Talk to your health care provider about which screenings and vaccines you need and how often you need them. This information is not intended to replace advice given to you by your health care provider. Make sure you discuss any questions you have with your health care provider. Document Released: 12/18/2015 Document Revised: 08/10/2016 Document Reviewed: 09/22/2015 Elsevier Interactive Patient Education  2017 [place] Prevention in the Home Falls can cause injuries. They can happen to people of all ages. There are many things you can do to make your home safe and to help prevent falls. What can I do on the outside of my home? Regularly fix the edges of walkways and driveways and fix any cracks. Remove anything that might make you trip as you walk through a door, such as a raised step or threshold. Trim any bushes or trees on the path to your home. Use bright outdoor lighting. Clear any walking paths of anything that might make someone trip, such as rocks or tools. Regularly check to see if handrails are loose or broken. Make sure that both sides of any steps have handrails. Any raised decks and porches should have guardrails on the edges. Have any leaves, snow, or ice cleared regularly. Use sand or salt on walking paths during winter. Clean up any spills in your garage right away. This includes oil or grease spills. What can I do in the bathroom? Use night lights. Install grab bars by the toilet and in the tub and shower. Do not use towel bars as grab bars. Use non-skid mats or decals in the tub or shower. If you need to sit down in the shower, use a plastic, non-slip stool. Keep the floor dry. Clean up any water that spills on the floor as soon as it happens. Remove soap buildup in the tub or shower regularly. Attach bath mats securely with  double-sided non-slip rug tape. Do not have throw rugs and other things on the floor that can make you trip. What can I do in the bedroom? Use night lights. Make sure that you have a light by your bed that is easy to reach. Do not use any sheets or blankets that are too big for your bed. They should not hang down onto the floor. Have a firm chair that has side arms. You can use this for support while you get dressed. Do not have throw rugs and other things on the floor that can make you trip. What can I do in the kitchen? Clean up any spills right away. Avoid walking on wet floors. Keep items that you use a lot in easy-to-reach places. If you need to reach something above you, use a strong step stool that has a grab bar. Keep electrical cords out of the way. Do not use floor polish or wax that makes floors slippery. If you must use wax, use non-skid floor wax. Do not have throw rugs and other things on the floor that can make you trip. What can I do with my stairs? Do not leave  any items on the stairs. Make sure that there are handrails on both sides of the stairs and use them. Fix handrails that are broken or loose. Make sure that handrails are as long as the stairways. Check any carpeting to make sure that it is firmly attached to the stairs. Fix any carpet that is loose or worn. Avoid having throw rugs at the top or bottom of the stairs. If you do have throw rugs, attach them to the floor with carpet tape. Make sure that you have a light switch at the top of the stairs and the bottom of the stairs. If you do not have them, ask someone to add them for you. What else can I do to help prevent falls? Wear shoes that: Do not have high heels. Have rubber bottoms. Are comfortable and fit you well. Are closed at the toe. Do not wear sandals. If you use a stepladder: Make sure that it is fully opened. Do not climb a closed stepladder. Make sure that both sides of the stepladder are locked  into place. Ask someone to hold it for you, if possible. Clearly mark and make sure that you can see: Any grab bars or handrails. First and last steps. Where the edge of each step is. Use tools that help you move around (mobility aids) if they are needed. These include: Canes. Walkers. Scooters. Crutches. Turn on the lights when you go into a dark area. Replace any light bulbs as soon as they burn out. Set up your furniture so you have a clear path. Avoid moving your furniture around. If any of your floors are uneven, fix them. If there are any pets around you, be aware of where they are. Review your medicines with your doctor. Some medicines can make you feel dizzy. This can increase your chance of falling. Ask your doctor what other things that you can do to help prevent falls. This information is not intended to replace advice given to you by your health care provider. Make sure you discuss any questions you have with your health care provider. Document Released: 09/17/2009 Document Revised: 04/28/2016 Document Reviewed: 12/26/2014 Elsevier Interactive Patient Education  2017 [employer].

## 2023-04-13 ENCOUNTER — Other Ambulatory Visit: Payer: Medicare Other

## 2023-04-13 DIAGNOSIS — Z515 Encounter for palliative care: Secondary | ICD-10-CM

## 2023-04-13 NOTE — Progress Notes (Signed)
COMMUNITY PALLIATIVE CARE SW NOTE  PATIENT NAME: Julia Salazar DOB: 05/09/1929 MRN: [id number]  PRIMARY CARE PROVIDER: Malva Limes, MD  RESPONSIBLE PARTY:  Acct ID - Guarantor Home Phone Work Phone Relationship Acct Type  [id number] HIMANI, WHITTY* [phone]  Self P/F     [address], [place], [place] [address]     PLAN OF CARE and INTERVENTIONS:              Palliative care encounter: PC SW completed f/u home visit with patient and patients daughters [name] and [name].  Today patient presents pleasantly confused. Patient was able to recall her birth date and some grandchildren from pictures on the wall, with assistance from daughters.   Equity visit: completed 4/29. started ABT for possible UTI for 7 days finishes 5/8. Will return 5/20. No labs done.  confusion status: equity to assess for dementia/mental status on next visit.   appetite: eats a good meals. and still drinks ensure as a supplement.   Bowel movement: last bowel movement on 5/3. Family giving stool softner and miralax. SW advised prune juice as well.  sleeping: sleeps okay, better since changing ativan and adding seroquel. taking about 1-2 naps a day.   pain level: back pain is more constant. taking vicodin and applying fentanyl patch.  agitation: started Ativan .25mg  bid to prn on 4/29 saw no improvement in agitation. went to 7am, 1pm, 7am changed to this on 5/8. still confused but less agitated.  Interventions: SW and family discussed keeping routine and starting memory aides such as a board with dates and pertinent information for patient to reference.  There are some general things you can do to help manage your memory problems.  Your memory may not in fact recover, but by using techniques and strategies you will be able to manage your memory difficulties better.   1)  Establish a routine. Try to establish and then stick to a regular routine.  By doing this, you will get used to what to expect and you  will reduce the need to rely on your memory.  Also, try to do things at the same time of day, such as taking your medication or checking your calendar first thing in the morning. Think about think that you can do as a part of a regular routine and make a list.  Then enter them into a daily planner to remind you.  This will help you establish a routine.   2)  Organize your environment. Organize your environment so that it is uncluttered.  Decrease visual stimulation.  Place everyday items such as keys or cell phone in the same place every day (ie.  Basket next to front door) Use post it notes with a brief message to yourself (ie. Turn off light, lock the door) Use labels to indicate where things go (ie. Which cupboards are for food, dishes, etc.) Keep a notepad and pen by the telephone to take messages   3)  Memory Aids A diary or journal/notebook/daily planner Making a list (shopping list, chore list, to do list that needs to be done) Using an alarm as a reminder (kitchen timer or cell phone alarm) Using cell phone to store information (Notes, Calendar, Reminders) Calendar/White board placed in a prominent position Post-it notes  Palliative care to continue to follow. Next visit in 3-4 weeks unless otherwise needed.  SOCIAL HX:  Social History   Tobacco Use   Smoking status: Never   Smokeless tobacco: Never  Substance Use Topics  Alcohol use: No    CODE STATUS: DNR  ADVANCED DIRECTIVES: Y. Daughter are HCPOA MOST FORM COMPLETE:  N HOSPICE EDUCATION PROVIDED: N  PPS: 30%  TIME SPENT: 35 MIN       [place], [place]

## 2023-04-17 ENCOUNTER — Other Ambulatory Visit: Payer: Self-pay | Admitting: Family Medicine

## 2023-04-17 DIAGNOSIS — M1712 Unilateral primary osteoarthritis, left knee: Secondary | ICD-10-CM

## 2023-04-17 DIAGNOSIS — M47816 Spondylosis without myelopathy or radiculopathy, lumbar region: Secondary | ICD-10-CM

## 2023-04-17 DIAGNOSIS — G8929 Other chronic pain: Secondary | ICD-10-CM

## 2023-04-17 MED ORDER — HYDROCODONE-ACETAMINOPHEN 7.5-325 MG PO TABS: 1.0000 | ORAL_TABLET | Freq: Four times a day (QID) | ORAL | 0 refills | Status: DC | PRN

## 2023-04-18 ENCOUNTER — Other Ambulatory Visit: Payer: Self-pay | Admitting: Family Medicine

## 2023-04-18 DIAGNOSIS — M545 Low back pain, unspecified: Secondary | ICD-10-CM

## 2023-04-18 DIAGNOSIS — M47816 Spondylosis without myelopathy or radiculopathy, lumbar region: Secondary | ICD-10-CM

## 2023-04-18 DIAGNOSIS — M1712 Unilateral primary osteoarthritis, left knee: Secondary | ICD-10-CM

## 2023-04-19 MED ORDER — FENTANYL 75 MCG/HR TD PT72: 1.0000 | MEDICATED_PATCH | TRANSDERMAL | 0 refills | Status: DC

## 2023-04-29 ENCOUNTER — Other Ambulatory Visit: Payer: Self-pay | Admitting: Family Medicine

## 2023-04-29 DIAGNOSIS — K529 Noninfective gastroenteritis and colitis, unspecified: Secondary | ICD-10-CM

## 2023-05-02 NOTE — Telephone Encounter (Signed)
Requested medication (s) are due for refill today: routing for review  Requested medication (s) are on the active medication list: yes  Last refill:  10/12/22  Future visit scheduled: no  Notes to clinic:  Unable to refill per protocol, routing for review for dosage.      Requested Prescriptions  Pending Prescriptions Disp Refills   cholestyramine light (PREVALITE) 4 GM/DOSE powder [Pharmacy Med Name: CHOLESTYRAMINE LIGHT POWDER] 718.2 g 1    Sig: MIX 2 GRAMS WITH FLUIDS AND DRINK EVERY 2-3 DAYS AS NEEDED FOR LOOSE BOWELS     Cardiovascular:  Antilipid - Bile Acid Sequestrants Failed - 04/29/2023  8:50 AM      Failed - Lipid Panel in normal range within the last 12 months    Cholesterol, Total  Date Value Ref Range Status  10/07/2020 187 100 - 199 mg/dL Final   LDL Chol Calc (NIH)  Date Value Ref Range Status  10/07/2020 111 (H) 0 - 99 mg/dL Final   HDL  Date Value Ref Range Status  10/07/2020 35 (L) >39 mg/dL Final   Triglycerides  Date Value Ref Range Status  10/07/2020 238 (H) 0 - 149 mg/dL Final         Passed - Valid encounter within last 12 months    Recent Outpatient Visits           3 months ago COVID-19   [facility] Malva Limes, MD   6 months ago Lumbar spondylosis   [facility] Malva Limes, MD   7 months ago Confusion   [facility] Alfredia Ferguson, PA-C   8 months ago NSTEMI (non-ST elevated myocardial infarction) [facility])   [facility] Malva Limes, MD   8 months ago Acute cough   [facility] Malva Limes, MD

## 2023-05-04 ENCOUNTER — Other Ambulatory Visit: Payer: Self-pay | Admitting: Family Medicine

## 2023-05-11 ENCOUNTER — Other Ambulatory Visit: Payer: Medicare Other

## 2023-05-11 VITALS — BP 132/60 | HR 54 | Temp 97.2°F | Wt 102.2 lb

## 2023-05-11 DIAGNOSIS — Z515 Encounter for palliative care: Secondary | ICD-10-CM

## 2023-05-11 NOTE — Progress Notes (Signed)
COMMUNITY PALLIATIVE CARE SW NOTE  PATIENT NAME: Julia Salazar DOB: 04-16-1929 MRN: [id number]  PRIMARY CARE PROVIDER: Malva Limes, MD  RESPONSIBLE PARTY:  Acct ID - Guarantor Home Phone Work Phone Relationship Acct Type  [id number] NHIA, MCNICHOL* [phone]  Self P/F     [address], [place], [place] [address]     PLAN OF CARE and INTERVENTIONS:              palliative care encounter: In home follow up PC visit with PC RN Raynelle Fanning, B,  patient and patients daughters [name] and Lynden Ang.  Dementia: Progressive disease expectation. Daughters shared that they have seen a change in patients behaviours over the past couple days, yesterday patient was more disoriented than normal yesterday and today patient is more dependent physically and fatigued. Daughter shared that it took patient a while a to wake up for the day this morning. Daughters share that patient has had a negative reaction to the seroquel, as it was making her more irritable and angry. daughters have since stopped the seroquel as of last week. patient is taking Ativan 2.5 mg Q 4-6 hrs. Per EH recent note, patient to consider depakote. Visual aids in place to assist with memory such as white board and clocks. Daughters share that over the past month or so patients processing and ability to swallow medications and feed self I has declined.  Plan; [facility] to follow up again on 6/17. (Needs f/u on depakote recommendation, urine retention, possible labs and mental/dementia examination, as well as medication review to remove any unnecessary medications). PC to f/u again 7/10 @10        SOCIAL HX:  Social History   Tobacco Use   Smoking status: Never   Smokeless tobacco: Never  Substance Use Topics   Alcohol use: No         Julia Salazar, [place]

## 2023-05-11 NOTE — Progress Notes (Signed)
PATIENT NAME: Julia Salazar DOB: 1929/10/20 MRN: [id number]  PRIMARY CARE PROVIDER: Malva Limes, MD  RESPONSIBLE PARTY:  Acct ID - Guarantor Home Phone Work Phone Relationship Acct Type  [id number] [name], [name] [phone]  Self P/F     [address], [place], [place] [address]   Home visit completed with patient, 2 daughters and [place], [place].   Appetite: Breakfast is best meal.  Lunch/dinner are hit and miss.  Daughters feel 2 meals are consistent.  Also taking supplement prn when she does not eat a good meal.  Family does not feel there has been any more weight loss.  Unable to weigh patient.   CHF:  No recent issues.  Denies any shortness of breath since she has become wheelchair bound.   No complaints of chest pain.   Dementia:  Wheelchair bound.  Able to assist with transfers but needs extensive assistance.  Family is providing full assistance with dressing, grooming and bathing.  Needing more assistance with feeding.  Daughter is assisting with hand to mouth when feeding.  Having a harder time swallowing medications, family is now crushing.  Limited engagement in conversation.  Often looks to family to assist with answering questions.  Continent of bowel and bladder.  Has nocturnal incontinence.  Wearing depends.  6 months ago patient was able to navigate an Ipad.  1 month ago patient was able to follow television shows.  Recently starting having issues with tearful behaviors.  Was on Seroquel but stopped due to worsening behavioral issues. Also having issues with agitation.  Currently using ativan about every 6 hours.   There was discussion about starting Depakote with PCP.  I will contact PCP office to follow up on Depakote.    Medication Management:  discussion on pill burden completed with family.  They would like to reduce pill burden.  Discussed stopping MVI, Calcium, Osteobiflex and Vitamin D3.  Will follow up with PCP.   Pain: Has issues with arthritic pain.  Taking  hydrocodone prn to manage.   Urinary Retention:  Patient has not voided since 930 pm last night.  Daughter advised that patient has done this previously.  They will place patient back on the bedside commode.   Will follow up with PCP office regarding urinary retention.   PCP: Connected with Misty Stanley, RN with PCP.  Updated on request for medication reduction, Depakote and urinary retention.  Misty Stanley will schedule a follow up visit for tomorrow and contact daughters.   CODE STATUS: DNR ADVANCED DIRECTIVES: N MOST FORM: No PPS: 30%   PHYSICAL EXAM:   VITALS: Today's Vitals   05/11/23 1007  BP: 132/60  Pulse: (!) 54  Temp: (!) 97.2 F (36.2 C)  SpO2: 90%  Weight: 102 lb 3.2 oz (46.4 kg)    LUNGS: clear to auscultation  CARDIAC: Cor irreg, irreg RRR}  EXTREMITIES: - for edema SKIN: Skin color, texture, turgor normal. No rashes or lesions or mobility and turgor normal  NEURO: positive for gait problems and memory problems       Truitt Merle, RN

## 2023-06-14 ENCOUNTER — Telehealth: Payer: Self-pay

## 2023-06-14 ENCOUNTER — Other Ambulatory Visit: Payer: Medicare Other

## 2023-06-14 NOTE — Telephone Encounter (Signed)
Copied from CRM [id number]. Topic: General - Other >> Jun 14, 2023  1:43 PM Dondra Prader A wrote: Reason for CRM: Cordelia Pen from [facility] states that they received a hospice referral for the pt and Dr. Sherrie Mustache is listed as the pt PCP. Pt will need to move from palliative care to hospice care and is wanting to see if it is ok for the pt to be transitioned over to hospice care. Please call Cordelia Pen back.

## 2023-06-14 NOTE — Telephone Encounter (Signed)
That's fine

## 2023-07-06 DEATH — deceased
